# Patient Record
Sex: Female | Born: 1945 | Race: White | Hispanic: No | Marital: Married | Smoking: Former smoker
Health system: Southern US, Community
[De-identification: ages and names within clinical notes are randomized; demographics above are authoritative.]

## PROBLEM LIST (undated history)

## (undated) DIAGNOSIS — E7800 Pure hypercholesterolemia, unspecified: Secondary | ICD-10-CM

## (undated) DIAGNOSIS — Z860100 Personal history of colon polyps, unspecified: Secondary | ICD-10-CM

## (undated) DIAGNOSIS — E039 Hypothyroidism, unspecified: Secondary | ICD-10-CM

## (undated) DIAGNOSIS — K219 Gastro-esophageal reflux disease without esophagitis: Secondary | ICD-10-CM

## (undated) DIAGNOSIS — Z86718 Personal history of other venous thrombosis and embolism: Secondary | ICD-10-CM

## (undated) DIAGNOSIS — I2699 Other pulmonary embolism without acute cor pulmonale: Secondary | ICD-10-CM

## (undated) HISTORY — DX: Personal history of colon polyps, unspecified: Z86.0100

## (undated) HISTORY — DX: Gastro-esophageal reflux disease without esophagitis: K21.9

## (undated) HISTORY — PX: KNEE SURGERY: SHX244

## (undated) HISTORY — DX: Other pulmonary embolism without acute cor pulmonale: I26.99

## (undated) HISTORY — PX: POLYPECTOMY: SHX149

## (undated) HISTORY — PX: TONSILLECTOMY: SUR1361

## (undated) HISTORY — DX: Personal history of other venous thrombosis and embolism: Z86.718

## (undated) HISTORY — DX: Pure hypercholesterolemia, unspecified: E78.00

## (undated) HISTORY — PX: VAGINAL HYSTERECTOMY: SUR661

## (undated) HISTORY — DX: Hypothyroidism, unspecified: E03.9

## (undated) HISTORY — DX: Personal history of colonic polyps: Z86.010

---

## 1988-09-02 DIAGNOSIS — Z86718 Personal history of other venous thrombosis and embolism: Secondary | ICD-10-CM

## 1988-09-02 HISTORY — DX: Personal history of other venous thrombosis and embolism: Z86.718

## 2000-08-14 ENCOUNTER — Other Ambulatory Visit: Admission: RE | Admit: 2000-08-14 | Discharge: 2000-08-14 | Payer: Self-pay | Admitting: Gynecology

## 2001-10-15 ENCOUNTER — Other Ambulatory Visit: Admission: RE | Admit: 2001-10-15 | Discharge: 2001-10-15 | Payer: Self-pay | Admitting: Gynecology

## 2002-12-21 ENCOUNTER — Other Ambulatory Visit: Admission: RE | Admit: 2002-12-21 | Discharge: 2002-12-21 | Payer: Self-pay | Admitting: Gynecology

## 2005-02-26 ENCOUNTER — Other Ambulatory Visit: Admission: RE | Admit: 2005-02-26 | Discharge: 2005-02-26 | Payer: Self-pay | Admitting: Gynecology

## 2006-09-03 ENCOUNTER — Other Ambulatory Visit: Admission: RE | Admit: 2006-09-03 | Discharge: 2006-09-03 | Payer: Self-pay | Admitting: Obstetrics and Gynecology

## 2009-05-19 ENCOUNTER — Emergency Department (HOSPITAL_COMMUNITY): Admission: EM | Admit: 2009-05-19 | Discharge: 2009-05-20 | Payer: Self-pay | Admitting: Emergency Medicine

## 2009-09-13 ENCOUNTER — Encounter (INDEPENDENT_AMBULATORY_CARE_PROVIDER_SITE_OTHER): Payer: Self-pay | Admitting: Emergency Medicine

## 2009-09-13 ENCOUNTER — Ambulatory Visit: Payer: Self-pay | Admitting: Cardiology

## 2009-09-13 ENCOUNTER — Observation Stay (HOSPITAL_COMMUNITY): Admission: EM | Admit: 2009-09-13 | Discharge: 2009-09-13 | Payer: Self-pay | Admitting: Emergency Medicine

## 2010-09-23 ENCOUNTER — Encounter (HOSPITAL_BASED_OUTPATIENT_CLINIC_OR_DEPARTMENT_OTHER): Payer: Self-pay | Admitting: General Surgery

## 2010-11-18 LAB — DIFFERENTIAL
Basophils Absolute: 0 10*3/uL (ref 0.0–0.1)
Basophils Relative: 1 % (ref 0–1)
Eosinophils Absolute: 0.3 10*3/uL (ref 0.0–0.7)
Eosinophils Relative: 4 % (ref 0–5)
Lymphocytes Relative: 47 % — ABNORMAL HIGH (ref 12–46)
Lymphs Abs: 3.6 10*3/uL (ref 0.7–4.0)
Monocytes Absolute: 0.4 10*3/uL (ref 0.1–1.0)
Monocytes Relative: 5 % (ref 3–12)
Neutro Abs: 3.3 10*3/uL (ref 1.7–7.7)
Neutrophils Relative %: 43 % (ref 43–77)

## 2010-11-18 LAB — POCT CARDIAC MARKERS
CKMB, poc: <1 ng/mL — ABNORMAL LOW (ref 1.0–8.0)
Myoglobin, poc: 30.8 ng/mL (ref 12–200)
Troponin i, poc: <0.05 ng/mL (ref 0.00–0.09)

## 2010-11-18 LAB — BASIC METABOLIC PANEL
BUN: 17 mg/dL (ref 6–23)
CO2: 30 mEq/L (ref 19–32)
Calcium: 9.6 mg/dL (ref 8.4–10.5)
Chloride: 105 mEq/L (ref 96–112)
Creatinine, Ser: 1.13 mg/dL (ref 0.4–1.2)
GFR calc Af Amer: 59 mL/min — ABNORMAL LOW (ref >60)
GFR calc non Af Amer: 49 mL/min — ABNORMAL LOW (ref >60)
Glucose, Bld: 107 mg/dL — ABNORMAL HIGH (ref 70–99)
Potassium: 3.7 mEq/L (ref 3.5–5.1)
Sodium: 143 mEq/L (ref 135–145)

## 2010-11-18 LAB — CBC
HCT: 40.2 % (ref 36.0–46.0)
Hemoglobin: 13.6 g/dL (ref 12.0–15.0)
MCHC: 33.7 g/dL (ref 30.0–36.0)
MCV: 93.8 fL (ref 78.0–100.0)
Platelets: 275 10*3/uL (ref 150–400)
RBC: 4.28 MIL/uL (ref 3.87–5.11)
RDW: 13.6 % (ref 11.5–15.5)
WBC: 7.6 10*3/uL (ref 4.0–10.5)

## 2010-11-18 LAB — TROPONIN I
Troponin I: 0.01 ng/mL (ref 0.00–0.06)
Troponin I: 0.02 ng/mL (ref 0.00–0.06)
Troponin I: 0.03 ng/mL (ref 0.00–0.06)

## 2010-11-18 LAB — CK TOTAL AND CKMB (NOT AT ARMC)
CK, MB: 1.1 ng/mL (ref 0.3–4.0)
CK, MB: 1.1 ng/mL (ref 0.3–4.0)
CK, MB: 1.1 ng/mL (ref 0.3–4.0)
Relative Index: INVALID (ref 0.0–2.5)
Relative Index: INVALID (ref 0.0–2.5)
Relative Index: INVALID (ref 0.0–2.5)
Total CK: 65 U/L (ref 7–177)
Total CK: 70 U/L (ref 7–177)
Total CK: 74 U/L (ref 7–177)

## 2010-11-18 LAB — D-DIMER, QUANTITATIVE (NOT AT ARMC): D-Dimer, Quant: 0.29 ug/mL-FEU (ref 0.00–0.48)

## 2010-11-18 LAB — TSH: TSH: 2.795 u[IU]/mL (ref 0.350–4.500)

## 2010-12-07 LAB — BASIC METABOLIC PANEL
BUN: 19 mg/dL (ref 6–23)
CO2: 30 mEq/L (ref 19–32)
Calcium: 9.6 mg/dL (ref 8.4–10.5)
Chloride: 105 mEq/L (ref 96–112)
Creatinine, Ser: 0.84 mg/dL (ref 0.4–1.2)
GFR calc Af Amer: >60 mL/min (ref >60)
GFR calc non Af Amer: >60 mL/min (ref >60)
Glucose, Bld: 94 mg/dL (ref 70–99)
Potassium: 3.3 mEq/L — ABNORMAL LOW (ref 3.5–5.1)
Sodium: 141 mEq/L (ref 135–145)

## 2010-12-07 LAB — URINALYSIS, ROUTINE W REFLEX MICROSCOPIC
Bilirubin Urine: NEGATIVE
Glucose, UA: NEGATIVE mg/dL
Ketones, ur: NEGATIVE mg/dL
Nitrite: NEGATIVE
Protein, ur: NEGATIVE mg/dL
Specific Gravity, Urine: 1.02 (ref 1.005–1.030)
Urobilinogen, UA: 0.2 mg/dL (ref 0.0–1.0)
pH: 5.5 (ref 5.0–8.0)

## 2010-12-07 LAB — CBC
HCT: 40.9 % (ref 36.0–46.0)
Hemoglobin: 13.6 g/dL (ref 12.0–15.0)
MCHC: 33.3 g/dL (ref 30.0–36.0)
MCV: 92.8 fL (ref 78.0–100.0)
Platelets: 288 10*3/uL (ref 150–400)
RBC: 4.41 MIL/uL (ref 3.87–5.11)
RDW: 13.1 % (ref 11.5–15.5)
WBC: 8.6 10*3/uL (ref 4.0–10.5)

## 2010-12-07 LAB — URINE MICROSCOPIC-ADD ON

## 2010-12-07 LAB — URINE CULTURE: Colony Count: 15000

## 2010-12-07 LAB — DIFFERENTIAL
Basophils Absolute: 0.1 10*3/uL (ref 0.0–0.1)
Basophils Relative: 1 % (ref 0–1)
Eosinophils Absolute: 0.4 10*3/uL (ref 0.0–0.7)
Eosinophils Relative: 4 % (ref 0–5)
Lymphocytes Relative: 44 % (ref 12–46)
Lymphs Abs: 3.7 10*3/uL (ref 0.7–4.0)
Monocytes Absolute: 0.5 10*3/uL (ref 0.1–1.0)
Monocytes Relative: 6 % (ref 3–12)
Neutro Abs: 3.9 10*3/uL (ref 1.7–7.7)
Neutrophils Relative %: 45 % (ref 43–77)

## 2011-05-11 ENCOUNTER — Emergency Department (HOSPITAL_COMMUNITY): Payer: BC Managed Care – PPO

## 2011-05-11 ENCOUNTER — Emergency Department (HOSPITAL_COMMUNITY)
Admission: EM | Admit: 2011-05-11 | Discharge: 2011-05-11 | Disposition: A | Discharge status: Home / Self Care | Payer: BC Managed Care – PPO | Attending: Emergency Medicine | Admitting: Emergency Medicine

## 2011-05-11 DIAGNOSIS — E039 Hypothyroidism, unspecified: Secondary | ICD-10-CM | POA: Insufficient documentation

## 2011-05-11 DIAGNOSIS — R109 Unspecified abdominal pain: Secondary | ICD-10-CM | POA: Insufficient documentation

## 2011-05-11 DIAGNOSIS — Z90710 Acquired absence of both cervix and uterus: Secondary | ICD-10-CM | POA: Insufficient documentation

## 2011-05-11 DIAGNOSIS — R110 Nausea: Secondary | ICD-10-CM | POA: Insufficient documentation

## 2011-05-11 DIAGNOSIS — M545 Low back pain, unspecified: Secondary | ICD-10-CM | POA: Insufficient documentation

## 2011-05-11 DIAGNOSIS — R350 Frequency of micturition: Secondary | ICD-10-CM | POA: Insufficient documentation

## 2011-05-11 DIAGNOSIS — E7800 Pure hypercholesterolemia, unspecified: Secondary | ICD-10-CM | POA: Insufficient documentation

## 2011-05-11 LAB — DIFFERENTIAL
Basophils Absolute: 0 10*3/uL (ref 0.0–0.1)
Basophils Relative: 1 % (ref 0–1)
Eosinophils Absolute: 0.3 10*3/uL (ref 0.0–0.7)
Eosinophils Relative: 4 % (ref 0–5)
Lymphocytes Relative: 38 % (ref 12–46)
Lymphs Abs: 2.7 10*3/uL (ref 0.7–4.0)
Monocytes Absolute: 0.5 10*3/uL (ref 0.1–1.0)
Monocytes Relative: 7 % (ref 3–12)
Neutro Abs: 3.6 10*3/uL (ref 1.7–7.7)
Neutrophils Relative %: 51 % (ref 43–77)

## 2011-05-11 LAB — CBC
HCT: 39.1 % (ref 36.0–46.0)
Hemoglobin: 14 g/dL (ref 12.0–15.0)
MCH: 32.1 pg (ref 26.0–34.0)
MCHC: 35.8 g/dL (ref 30.0–36.0)
MCV: 89.7 fL (ref 78.0–100.0)
Platelets: 259 10*3/uL (ref 150–400)
RBC: 4.36 MIL/uL (ref 3.87–5.11)
RDW: 13.2 % (ref 11.5–15.5)
WBC: 7.1 10*3/uL (ref 4.0–10.5)

## 2011-05-11 LAB — URINALYSIS, ROUTINE W REFLEX MICROSCOPIC
Bilirubin Urine: NEGATIVE
Glucose, UA: NEGATIVE mg/dL
Ketones, ur: NEGATIVE mg/dL
Leukocytes, UA: NEGATIVE
Nitrite: NEGATIVE
Protein, ur: NEGATIVE mg/dL
Specific Gravity, Urine: 1.009 (ref 1.005–1.030)
Urobilinogen, UA: 0.2 mg/dL (ref 0.0–1.0)
pH: 6 (ref 5.0–8.0)

## 2011-05-11 LAB — URINE MICROSCOPIC-ADD ON

## 2011-05-11 LAB — BASIC METABOLIC PANEL
BUN: 19 mg/dL (ref 6–23)
CO2: 29 mEq/L (ref 19–32)
Calcium: 9.9 mg/dL (ref 8.4–10.5)
Chloride: 100 mEq/L (ref 96–112)
Creatinine, Ser: 0.73 mg/dL (ref 0.50–1.10)
GFR calc Af Amer: >60 mL/min (ref >60)
GFR calc non Af Amer: >60 mL/min (ref >60)
Glucose, Bld: 108 mg/dL — ABNORMAL HIGH (ref 70–99)
Potassium: 4.1 mEq/L (ref 3.5–5.1)
Sodium: 136 mEq/L (ref 135–145)

## 2011-05-12 LAB — URINE CULTURE
Colony Count: 2000
Culture  Setup Time: 201209081343

## 2012-08-21 ENCOUNTER — Ambulatory Visit (INDEPENDENT_AMBULATORY_CARE_PROVIDER_SITE_OTHER): Payer: BC Managed Care – PPO | Admitting: Women's Health

## 2012-08-21 ENCOUNTER — Encounter: Payer: Self-pay | Admitting: Women's Health

## 2012-08-21 DIAGNOSIS — N898 Other specified noninflammatory disorders of vagina: Secondary | ICD-10-CM

## 2012-08-21 DIAGNOSIS — L293 Anogenital pruritus, unspecified: Secondary | ICD-10-CM

## 2012-08-21 LAB — WET PREP FOR TRICH, YEAST, CLUE
Clue Cells Wet Prep HPF POC: NONE SEEN
Trich, Wet Prep: NONE SEEN
Yeast Wet Prep HPF POC: NONE SEEN

## 2012-08-21 MED ORDER — TERCONAZOLE 0.4 % VA CREA: 1.0000 | TOPICAL_CREAM | Freq: Every day | VAGINAL | Status: DC

## 2012-08-21 NOTE — Progress Notes (Signed)
Patient ID: Sandra Ellis, female   DOB: 03-Mar-1946, 66 y.o.   MRN: [id number] Presents with complaint of external vaginal itching. Denies any urinary symptoms, fever or abdominal pain. History of a hysterectomy.Marland Kitchen Has not been seen in the office in several years. Plans to schedule next annual exam here is current on Paps and mammograms. Reports a history of a normal bone density last year and colonoscopy with no polyps. Tdap 2012.  Exam: External genitalia extremely erythematous at introitus, speculum exam vaginal atrophy with dryness, wet prep negative. Bimanual no adnexal fullness or tenderness.  Clinical yeast,  Plan: Terazol 7, apply externally, instructed to call if no relief of symptoms. Loose clothes,

## 2012-12-28 ENCOUNTER — Encounter: Payer: Self-pay | Admitting: Women's Health

## 2012-12-28 ENCOUNTER — Ambulatory Visit (INDEPENDENT_AMBULATORY_CARE_PROVIDER_SITE_OTHER): Payer: BC Managed Care – PPO | Admitting: Women's Health

## 2012-12-28 VITALS — BP 110/72 | Ht 67.0 in | Wt 137.0 lb

## 2012-12-28 DIAGNOSIS — Z01419 Encounter for gynecological examination (general) (routine) without abnormal findings: Secondary | ICD-10-CM

## 2012-12-28 NOTE — Progress Notes (Signed)
Sandra Ellis 13-Sep-1945 [id number]    History:    The patient presents for annual exam.  TVH for DUB on no HRT.  Normal paps and mammograms.  Negative colonoscopy 2012. Reports labs and Dexa normal at [facility]. Reports would not take medication if osteoporosis and declines repeating dexa.  Has had elevated chol and is taking Niacin, fish oil and red rice yeast, declined statins from [facility].   Past medical history, past surgical history, family history and social history were all reviewed and documented in the EPIC chart. Minister, does mission trips. Current on all immunizations.   ROS:  A  ROS was performed and pertinent positives and negatives are included in the history.  Exam:  Filed Vitals:   12/28/12 1044  BP: 110/72    General appearance:  Normal Head/Neck:  Normal, without cervical or supraclavicular adenopathy. Thyroid:  Symmetrical, normal in size, without palpable masses or nodularity. Respiratory  Effort:  Normal  Auscultation:  Clear without wheezing or rhonchi Cardiovascular  Auscultation:  Regular rate, without rubs, murmurs or gallops  Edema/varicosities:  Not grossly evident Abdominal  Soft,nontender, without masses, guarding or rebound.  Liver/spleen:  No organomegaly noted  Hernia:  None appreciated  Skin  Inspection:  Grossly normal  Palpation:  Grossly normal Neurologic/psychiatric  Orientation:  Normal with appropriate conversation.  Mood/affect:  Normal  Genitourinary    Breasts: Examined lying and sitting.     Right: Without masses, retractions, discharge or axillary adenopathy.     Left: Without masses, retractions, discharge or axillary adenopathy.   Inguinal/mons:  Normal without inguinal adenopathy  External genitalia:  Normal  BUS/Urethra/Skene's glands:  Normal  Bladder:  Normal  Vagina:  Normal  Cervix:  absent  Uterus:  Absent  Adnexa/parametria:     Rt: Without masses or tenderness.   Lt: Without masses or tenderness.  Anus and  perineum: Normal  Digital rectal exam: Normal sphincter tone without palpated masses or tenderness  Assessment/Plan:  67 y.o.MWF   for annual exam with no complaints.  Hysterectomy for DUB/ on no HRT Labs- PC  Plan:  SBE's, continue annual mammogram, regular exercise, calcium rich diet, Vit D 2000 daily encouraged.Home safety and fall prevention reviewed.  Home hemocult card given.   Harrington Challenger WHNP, 7:00 PM 12/28/2012

## 2012-12-28 NOTE — Patient Instructions (Signed)

## 2013-01-01 ENCOUNTER — Encounter: Payer: Self-pay | Admitting: Women's Health

## 2013-02-23 DIAGNOSIS — R109 Unspecified abdominal pain: Secondary | ICD-10-CM | POA: Diagnosis not present

## 2013-02-25 ENCOUNTER — Encounter (HOSPITAL_COMMUNITY): Payer: Self-pay | Admitting: Emergency Medicine

## 2013-02-25 ENCOUNTER — Emergency Department (HOSPITAL_COMMUNITY): Payer: Medicare Other

## 2013-02-25 ENCOUNTER — Emergency Department (HOSPITAL_COMMUNITY)
Admission: EM | Admit: 2013-02-25 | Discharge: 2013-02-25 | Disposition: A | Discharge status: Home / Self Care | Payer: Medicare Other | Attending: Emergency Medicine | Admitting: Emergency Medicine

## 2013-02-25 DIAGNOSIS — K828 Other specified diseases of gallbladder: Secondary | ICD-10-CM | POA: Diagnosis not present

## 2013-02-25 DIAGNOSIS — R1033 Periumbilical pain: Secondary | ICD-10-CM | POA: Diagnosis not present

## 2013-02-25 DIAGNOSIS — Z87891 Personal history of nicotine dependence: Secondary | ICD-10-CM | POA: Insufficient documentation

## 2013-02-25 DIAGNOSIS — Z79899 Other long term (current) drug therapy: Secondary | ICD-10-CM | POA: Diagnosis not present

## 2013-02-25 DIAGNOSIS — R109 Unspecified abdominal pain: Secondary | ICD-10-CM | POA: Insufficient documentation

## 2013-02-25 DIAGNOSIS — Z7982 Long term (current) use of aspirin: Secondary | ICD-10-CM | POA: Insufficient documentation

## 2013-02-25 DIAGNOSIS — E7800 Pure hypercholesterolemia, unspecified: Secondary | ICD-10-CM | POA: Insufficient documentation

## 2013-02-25 DIAGNOSIS — Z90710 Acquired absence of both cervix and uterus: Secondary | ICD-10-CM | POA: Diagnosis not present

## 2013-02-25 LAB — COMPREHENSIVE METABOLIC PANEL
ALT: 16 U/L (ref 0–35)
AST: 18 U/L (ref 0–37)
Albumin: 3.5 g/dL (ref 3.5–5.2)
Alkaline Phosphatase: 64 U/L (ref 39–117)
BUN: 13 mg/dL (ref 6–23)
CO2: 28 mEq/L (ref 19–32)
Calcium: 9.8 mg/dL (ref 8.4–10.5)
Chloride: 97 mEq/L (ref 96–112)
Creatinine, Ser: 0.76 mg/dL (ref 0.50–1.10)
GFR calc Af Amer: >90 mL/min (ref >90)
GFR calc non Af Amer: 87 mL/min — ABNORMAL LOW (ref >90)
Glucose, Bld: 114 mg/dL — ABNORMAL HIGH (ref 70–99)
Potassium: 2.9 mEq/L — ABNORMAL LOW (ref 3.5–5.1)
Sodium: 136 mEq/L (ref 135–145)
Total Bilirubin: 0.3 mg/dL (ref 0.3–1.2)
Total Protein: 7.4 g/dL (ref 6.0–8.3)

## 2013-02-25 LAB — CBC WITH DIFFERENTIAL/PLATELET
Basophils Absolute: 0 10*3/uL (ref 0.0–0.1)
Basophils Relative: 0 % (ref 0–1)
Eosinophils Absolute: 0.3 10*3/uL (ref 0.0–0.7)
Eosinophils Relative: 3 % (ref 0–5)
HCT: 42.6 % (ref 36.0–46.0)
Hemoglobin: 14.8 g/dL (ref 12.0–15.0)
Lymphocytes Relative: 34 % (ref 12–46)
Lymphs Abs: 2.7 10*3/uL (ref 0.7–4.0)
MCH: 30.7 pg (ref 26.0–34.0)
MCHC: 34.7 g/dL (ref 30.0–36.0)
MCV: 88.4 fL (ref 78.0–100.0)
Monocytes Absolute: 0.5 10*3/uL (ref 0.1–1.0)
Monocytes Relative: 7 % (ref 3–12)
Neutro Abs: 4.4 10*3/uL (ref 1.7–7.7)
Neutrophils Relative %: 56 % (ref 43–77)
Platelets: 288 10*3/uL (ref 150–400)
RBC: 4.82 MIL/uL (ref 3.87–5.11)
RDW: 13 % (ref 11.5–15.5)
WBC: 7.8 10*3/uL (ref 4.0–10.5)

## 2013-02-25 LAB — URINALYSIS, ROUTINE W REFLEX MICROSCOPIC
Bilirubin Urine: NEGATIVE
Glucose, UA: NEGATIVE mg/dL
Hgb urine dipstick: NEGATIVE
Ketones, ur: NEGATIVE mg/dL
Nitrite: NEGATIVE
Protein, ur: NEGATIVE mg/dL
Specific Gravity, Urine: 1.017 (ref 1.005–1.030)
Urobilinogen, UA: 1 mg/dL (ref 0.0–1.0)
pH: 7.5 (ref 5.0–8.0)

## 2013-02-25 LAB — LIPASE, BLOOD: Lipase: 29 U/L (ref 11–59)

## 2013-02-25 LAB — URINE MICROSCOPIC-ADD ON

## 2013-02-25 MED ORDER — ONDANSETRON HCL 4 MG/2ML IJ SOLN
4.0000 mg | Freq: Once | INTRAMUSCULAR | Status: AC
  Administered 2013-02-25: 4 mg via INTRAVENOUS

## 2013-02-25 MED ORDER — BELLADONNA ALK-PHENOBARBITAL 16.2 MG PO TABS: 1.0000 | ORAL_TABLET | Freq: Three times a day (TID) | ORAL | Status: DC | PRN

## 2013-02-25 MED ORDER — PROMETHAZINE HCL 25 MG PO TABS: 25.0000 mg | ORAL_TABLET | Freq: Four times a day (QID) | ORAL | Status: DC | PRN

## 2013-02-25 MED ORDER — POTASSIUM CHLORIDE CRYS ER 20 MEQ PO TBCR
40.0000 meq | EXTENDED_RELEASE_TABLET | Freq: Once | ORAL | Status: AC
  Administered 2013-02-25: 40 meq via ORAL
  Filled 2013-02-25: qty 2

## 2013-02-25 MED ORDER — HYDROMORPHONE HCL PF 1 MG/ML IJ SOLN: 0.5000 mg | Freq: Once | INTRAMUSCULAR | Status: DC

## 2013-02-25 MED ORDER — OXYCODONE-ACETAMINOPHEN 5-325 MG PO TABS: 1.0000 | ORAL_TABLET | Freq: Four times a day (QID) | ORAL | Status: DC | PRN

## 2013-02-25 MED ORDER — ONDANSETRON HCL 4 MG/2ML IJ SOLN: 4.0000 mg | Freq: Once | INTRAMUSCULAR | Status: DC

## 2013-02-25 MED ORDER — IOHEXOL 300 MG/ML  SOLN
100.0000 mL | Freq: Once | INTRAMUSCULAR | Status: AC | PRN
  Administered 2013-02-25: 100 mL via INTRAVENOUS

## 2013-02-25 MED ORDER — IOHEXOL 300 MG/ML  SOLN
50.0000 mL | Freq: Once | INTRAMUSCULAR | Status: AC | PRN
  Administered 2013-02-25: 50 mL via ORAL

## 2013-02-25 MED ORDER — HYDROMORPHONE HCL PF 1 MG/ML IJ SOLN
0.5000 mg | Freq: Once | INTRAMUSCULAR | Status: AC
  Administered 2013-02-25: 0.5 mg via INTRAVENOUS
  Filled 2013-02-25: qty 1

## 2013-02-25 MED ORDER — ONDANSETRON HCL 4 MG/2ML IJ SOLN
4.0000 mg | Freq: Once | INTRAMUSCULAR | Status: DC
  Filled 2013-02-25: qty 2

## 2013-02-25 MED ORDER — SODIUM CHLORIDE 0.9 % IV SOLN
Freq: Once | INTRAVENOUS | Status: AC
  Administered 2013-02-25: 09:00:00 via INTRAVENOUS

## 2013-02-25 NOTE — ED Provider Notes (Signed)
History    CSN: [id number] Arrival date & time 02/25/13  8295  First MD Initiated Contact with Patient 02/25/13 0809     Chief Complaint  Patient presents with  . Abdominal Pain   (Consider location/radiation/quality/duration/timing/severity/associated sxs/prior Treatment) HPI.... lower abdominal pain radiating to the. Umbilical area since Sunday. Patient went to her primary care doctor on Tuesday and diagnosed with no identifiable cause.  Nothing makes symptoms better or worse. Severity is mild. Patient is eating. No dysuria, constipation, fever, chills, vaginal bleeding, vaginal discharge..  Status post vaginal hysterectomy years ago.  No radiation of pain Past Medical History  Diagnosis Date  . Headache(784.0)   . Elevated cholesterol    Past Surgical History  Procedure Laterality Date  . Vaginal hysterectomy      TVH  . Tonisllectomy    . Knee surgery     Family History  Problem Relation Age of Onset  . Diabetes Mother    History  Substance Use Topics  . Smoking status: Former Games developer  . Smokeless tobacco: Not on file  . Alcohol Use: No   OB History   Grav Para Term Preterm Abortions TAB SAB Ect Mult Living   3 3        3      Review of Systems  All other systems reviewed and are negative.    Allergies  Doxycycline  Home Medications   Current Outpatient Rx  Name  Route  Sig  Dispense  Refill  . aspirin 81 MG tablet   Oral   Take 81 mg by mouth 2 (two) times daily.         Marland Kitchen atenolol (TENORMIN) 25 MG tablet   Oral   Take 12.5 mg by mouth daily.          Marland Kitchen co-enzyme Q-10 30 MG capsule   Oral   Take 30 mg by mouth 3 (three) times daily.         . Cranberry 1000 MG CAPS   Oral   Take by mouth.         Marland Kitchen glucosamine-chondroitin 500-400 MG tablet   Oral   Take 1 tablet by mouth 3 (three) times daily.         . hydrochlorothiazide (HYDRODIURIL) 25 MG tablet   Oral   Take 25 mg by mouth daily.         Marland Kitchen levothyroxine (SYNTHROID,  LEVOTHROID) 75 MCG tablet   Oral   Take 75 mcg by mouth daily.         Marland Kitchen omega-3 acid ethyl esters (LOVAZA) 1 G capsule   Oral   Take 2 g by mouth 2 (two) times daily.         . Probiotic Product (ALIGN PO)   Oral   Take 1 capsule by mouth daily.         . belladonna alk-PHENObarbital (DONNATAL) 16.2 MG tablet   Oral   Take 1 tablet by mouth 3 (three) times daily as needed.   30 tablet   0   . oxyCODONE-acetaminophen (PERCOCET/ROXICET) 5-325 MG per tablet   Oral   Take 1-2 tablets by mouth every 6 (six) hours as needed for pain.   20 tablet   0   . promethazine (PHENERGAN) 25 MG tablet   Oral   Take 1 tablet (25 mg total) by mouth every 6 (six) hours as needed for nausea.   20 tablet   0    BP 119/57  Pulse 55  Temp(Src) 97.8 F (36.6 C) (Oral)  Resp 20  SpO2 95% Physical Exam  Nursing note and vitals reviewed. Constitutional: She is oriented to person, place, and time. She appears well-developed and well-nourished.  HENT:  Head: Normocephalic and atraumatic.  Eyes: Conjunctivae and EOM are normal. Pupils are equal, round, and reactive to light.  Neck: Normal range of motion. Neck supple.  Cardiovascular: Normal rate, regular rhythm and normal heart sounds.   Pulmonary/Chest: Effort normal and breath sounds normal.  Abdominal: Soft. Bowel sounds are normal.  Minimal suprapubic discomfort and periumbilical discomfort to palpation  Musculoskeletal: Normal range of motion.  Neurological: She is alert and oriented to person, place, and time.  Skin: Skin is warm and dry.  Psychiatric: She has a normal mood and affect.    ED Course  Procedures (including critical care time) Labs Reviewed  COMPREHENSIVE METABOLIC PANEL - Abnormal; Notable for the following:    Potassium 2.9 (*)    Glucose, Bld 114 (*)    GFR calc non Af Amer 87 (*)    All other components within normal limits  URINALYSIS, ROUTINE W REFLEX MICROSCOPIC - Abnormal; Notable for the following:     Leukocytes, UA SMALL (*)    All other components within normal limits  CBC WITH DIFFERENTIAL  LIPASE, BLOOD  URINE MICROSCOPIC-ADD ON   Ct Abdomen Pelvis W Contrast  02/25/2013   *RADIOLOGY REPORT*  Clinical Data: Lower abdomen pain for several days, mild nausea, normal white blood cell count  CT ABDOMEN AND PELVIS WITH CONTRAST  Technique:  Multidetector CT imaging of the abdomen and pelvis was performed following the standard protocol during bolus administration of intravenous contrast.  Contrast: 50mL OMNIPAQUE IOHEXOL 300 MG/ML  SOLN, OMNIPAQUE IOHEXOL 300 MG/ML  SOLN  Comparison: CT abdomen pelvis of 05/11/2011  Findings: The lung bases are clear.  The probable cyst in the dome of the right lobe of liver is stable.  The liver enhances with no focal abnormality and no ductal dilatation is seen.  The gallbladder is slightly distended but no gallstones are noted and there is no definite gallbladder wall thickening.  There is diffuse atrophy of the pancreas.  The adrenal glands and spleen are unremarkable.  The stomach is moderately fluid distended with no significant abnormality noted.  The kidneys enhance with no calculus or mass and on delayed images, the pelvocaliceal systems are unremarkable.  The proximal ureters are normal in caliber.  The abdominal aorta is normal in caliber with only mild atheromatous change present.  No adenopathy is seen.  The urinary bladder is not well distended.  The patient has previous undergone hysterectomy, and no adnexal lesion is seen.  No fluid is noted within the pelvis.  The appendix is coiled within the posterior right lower pelvis and is only 6 mm in diameter.  No inflammatory process is seen.  The terminal ileum also is unremarkable.  Degenerative joint disease is noted primarily involving the left hip.  The lumbar vertebrae are in normal alignment with normal joint spaces.  IMPRESSION:  1.  No explanation for the patient's lower abdominal pain is seen. 2.   The appendix and terminal ileum are unremarkable. 3.  Slightly distended gallbladder but no gallstones or gallbladder wall thickening. 4.  Somewhat atrophic appearance of the pancreas. 5.  Degenerative joint disease of the left hip.   Original Report Authenticated By: Dwyane Dee, M.D.   1. Abdominal pain     MDM  No acute abdomen. CT scan  shows no acute pathology. Patient has primary care followup. Discharge meds Donnatal, Percocet, Phenergan 25 mg  Donnetta Hutching, MD 02/25/13 1440

## 2013-02-25 NOTE — ED Notes (Signed)
States that she has had abd pain since Sunday was seen by her PCP on Tuesday and was given medications and that has not helped.

## 2013-02-26 ENCOUNTER — Telehealth (HOSPITAL_COMMUNITY): Payer: Self-pay | Admitting: Emergency Medicine

## 2013-05-10 ENCOUNTER — Ambulatory Visit
Admission: RE | Admit: 2013-05-10 | Discharge: 2013-05-10 | Disposition: A | Discharge status: Home / Self Care | Payer: Medicare Other | Source: Ambulatory Visit | Attending: Family Medicine | Admitting: Family Medicine

## 2013-05-10 ENCOUNTER — Other Ambulatory Visit: Payer: Self-pay | Admitting: Family Medicine

## 2013-05-10 DIAGNOSIS — E042 Nontoxic multinodular goiter: Secondary | ICD-10-CM

## 2013-05-10 DIAGNOSIS — B9789 Other viral agents as the cause of diseases classified elsewhere: Secondary | ICD-10-CM | POA: Diagnosis not present

## 2013-07-02 DIAGNOSIS — L2089 Other atopic dermatitis: Secondary | ICD-10-CM | POA: Diagnosis not present

## 2013-07-02 DIAGNOSIS — E039 Hypothyroidism, unspecified: Secondary | ICD-10-CM | POA: Diagnosis not present

## 2013-07-02 DIAGNOSIS — I10 Essential (primary) hypertension: Secondary | ICD-10-CM | POA: Diagnosis not present

## 2013-07-02 DIAGNOSIS — Z1231 Encounter for screening mammogram for malignant neoplasm of breast: Secondary | ICD-10-CM | POA: Diagnosis not present

## 2013-07-02 DIAGNOSIS — E785 Hyperlipidemia, unspecified: Secondary | ICD-10-CM | POA: Diagnosis not present

## 2013-07-05 ENCOUNTER — Encounter: Payer: Self-pay | Admitting: Women's Health

## 2013-09-08 DIAGNOSIS — J209 Acute bronchitis, unspecified: Secondary | ICD-10-CM | POA: Diagnosis not present

## 2013-09-10 DIAGNOSIS — J209 Acute bronchitis, unspecified: Secondary | ICD-10-CM | POA: Diagnosis not present

## 2013-09-10 DIAGNOSIS — Z888 Allergy status to other drugs, medicaments and biological substances status: Secondary | ICD-10-CM | POA: Diagnosis not present

## 2013-12-13 DIAGNOSIS — L03039 Cellulitis of unspecified toe: Secondary | ICD-10-CM | POA: Diagnosis not present

## 2013-12-29 ENCOUNTER — Encounter: Payer: Self-pay | Admitting: Women's Health

## 2013-12-29 ENCOUNTER — Ambulatory Visit (INDEPENDENT_AMBULATORY_CARE_PROVIDER_SITE_OTHER): Payer: Medicare Other | Admitting: Women's Health

## 2013-12-29 VITALS — BP 108/64 | Ht 67.25 in | Wt 140.0 lb

## 2013-12-29 DIAGNOSIS — E039 Hypothyroidism, unspecified: Secondary | ICD-10-CM | POA: Diagnosis not present

## 2013-12-29 DIAGNOSIS — I10 Essential (primary) hypertension: Secondary | ICD-10-CM | POA: Diagnosis not present

## 2013-12-29 DIAGNOSIS — N952 Postmenopausal atrophic vaginitis: Secondary | ICD-10-CM | POA: Diagnosis not present

## 2013-12-29 NOTE — Patient Instructions (Signed)
Health Recommendations for Postmenopausal Women Respected and ongoing research has looked at the most common causes of death, disability, and poor quality of life in postmenopausal women. The causes include heart disease, diseases of blood vessels, diabetes, depression, cancer, and bone loss (osteoporosis). Many things can be done to help lower the chances of developing these and other common problems: CARDIOVASCULAR DISEASE Heart Disease: A heart attack is a medical emergency. Know the signs and symptoms of a heart attack. Below are things women can do to reduce their risk for heart disease.   Do not smoke. If you smoke, quit.  Aim for a healthy weight. Being overweight causes many preventable deaths. Eat a healthy and balanced diet and drink an adequate amount of liquids.  Get moving. Make a commitment to be more physically active. Aim for 30 minutes of activity on most, if not all days of the week.  Eat for heart health. Choose a diet that is low in saturated fat and cholesterol and eliminate trans fat. Include whole grains, vegetables, and fruits. Read and understand the labels on food containers before buying.  Know your numbers. Ask your caregiver to check your blood pressure, cholesterol (total, HDL, LDL, triglycerides) and blood glucose. Work with your caregiver on improving your entire clinical picture.  High blood pressure. Limit or stop your table salt intake (try salt substitute and food seasonings). Avoid salty foods and drinks. Read labels on food containers before buying. Eating well and exercising can help control high blood pressure. STROKE  Stroke is a medical emergency. Stroke may be the result of a blood clot in a blood vessel in the brain or by a brain hemorrhage (bleeding). Know the signs and symptoms of a stroke. To lower the risk of developing a stroke:  Avoid fatty foods.  Quit smoking.  Control your diabetes, blood pressure, and irregular heart rate. THROMBOPHLEBITIS  (BLOOD CLOT) OF THE LEG  Becoming overweight and leading a stationary lifestyle may also contribute to developing blood clots. Controlling your diet and exercising will help lower the risk of developing blood clots. CANCER SCREENING  Breast Cancer: Take steps to reduce your risk of breast cancer.  You should practice "breast self-awareness." This means understanding the normal appearance and feel of your breasts and should include breast self-examination. Any changes detected, no matter how small, should be reported to your caregiver.  After age 40, you should have a clinical breast exam (CBE) every year.  Starting at age 40, you should consider having a mammogram (breast X-ray) every year.  If you have a family history of breast cancer, talk to your caregiver about genetic screening.  If you are at high risk for breast cancer, talk to your caregiver about having an MRI and a mammogram every year.  Intestinal or Stomach Cancer: Tests to consider are a rectal exam, fecal occult blood, sigmoidoscopy, and colonoscopy. Women who are high risk may need to be screened at an earlier age and more often.  Cervical Cancer:  Beginning at age 30, you should have a Pap test every 3 years as long as the past 3 Pap tests have been normal.  If you have had past treatment for cervical cancer or a condition that could lead to cancer, you need Pap tests and screening for cancer for at least 20 years after your treatment.  If you had a hysterectomy for a problem that was not cancer or a condition that could lead to cancer, then you no longer need Pap tests.    If you are between ages 65 and 70, and you have had normal Pap tests going back 10 years, you no longer need Pap tests.  If Pap tests have been discontinued, risk factors (such as a new sexual partner) need to be reassessed to determine if screening should be resumed.  Some medical problems can increase the chance of getting cervical cancer. In these  cases, your caregiver may recommend more frequent screening and Pap tests.  Uterine Cancer: If you have vaginal bleeding after reaching menopause, you should notify your caregiver.  Ovarian cancer: Other than yearly pelvic exams, there are no reliable tests available to screen for ovarian cancer at this time except for yearly pelvic exams.  Lung Cancer: Yearly chest X-rays can detect lung cancer and should be done on high risk women, such as cigarette smokers and women with chronic lung disease (emphysema).  Skin Cancer: A complete body skin exam should be done at your yearly examination. Avoid overexposure to the sun and ultraviolet light lamps. Use a strong sun block cream when in the sun. All of these things are important in lowering the risk of skin cancer. MENOPAUSE Menopause Symptoms: Hormone therapy products are effective for treating symptoms associated with menopause:  Moderate to severe hot flashes.  Night sweats.  Mood swings.  Headaches.  Tiredness.  Loss of sex drive.  Insomnia.  Other symptoms. Hormone replacement carries certain risks, especially in older women. Women who use or are thinking about using estrogen or estrogen with progestin treatments should discuss that with their caregiver. Your caregiver will help you understand the benefits and risks. The ideal dose of hormone replacement therapy is not known. The Food and Drug Administration (FDA) has concluded that hormone therapy should be used only at the lowest doses and for the shortest amount of time to reach treatment goals.  OSTEOPOROSIS Protecting Against Bone Loss and Preventing Fracture: If you use hormone therapy for prevention of bone loss (osteoporosis), the risks for bone loss must outweigh the risk of the therapy. Ask your caregiver about other medications known to be safe and effective for preventing bone loss and fractures. To guard against bone loss or fractures, the following is recommended:  If  you are less than age 50, take 1000 mg of calcium and at least 600 mg of Vitamin D per day.  If you are greater than age 50 but less than age 70, take 1200 mg of calcium and at least 600 mg of Vitamin D per day.  If you are greater than age 70, take 1200 mg of calcium and at least 800 mg of Vitamin D per day. Smoking and excessive alcohol intake increases the risk of osteoporosis. Eat foods rich in calcium and vitamin D and do weight bearing exercises several times a week as your caregiver suggests. DIABETES Diabetes Melitus: If you have Type I or Type 2 diabetes, you should keep your blood sugar under control with diet, exercise and recommended medication. Avoid too many sweets, starchy and fatty foods. Being overweight can make control more difficult. COGNITION AND MEMORY Cognition and Memory: Menopausal hormone therapy is not recommended for the prevention of cognitive disorders such as Alzheimer's disease or memory loss.  DEPRESSION  Depression may occur at any age, but is common in elderly women. The reasons may be because of physical, medical, social (loneliness), or financial problems and needs. If you are experiencing depression because of medical problems and control of symptoms, talk to your caregiver about this. Physical activity and   exercise may help with mood and sleep. Community and volunteer involvement may help your sense of value and worth. If you have depression and you feel that the problem is getting worse or becoming severe, talk to your caregiver about treatment options that are best for you. ACCIDENTS  Accidents are common and can be serious in the elderly woman. Prepare your house to prevent accidents. Eliminate throw rugs, place hand bars in the bath, shower and toilet areas. Avoid wearing high heeled shoes or walking on wet, snowy, and icy areas. Limit or stop driving if you have vision or hearing problems, or you feel you are unsteady with you movements and  reflexes. HEPATITIS C Hepatitis C is a type of viral infection affecting the liver. It is spread mainly through contact with blood from an infected person. It can be treated, but if left untreated, it can lead to severe liver damage over years. Many people who are infected do not know that the virus is in their blood. If you are a "baby-boomer", it is recommended that you have one screening test for Hepatitis C. IMMUNIZATIONS  Several immunizations are important to consider having during your senior years, including:   Tetanus, diptheria, and pertussis booster shot.  Influenza every year before the flu season begins.  Pneumonia vaccine.  Shingles vaccine.  Others as indicated based on your specific needs. Talk to your caregiver about these. Document Released: 10/11/2005 Document Revised: 08/05/2012 Document Reviewed: 06/06/2008 Exit[employer] Patient Information 2014 [employer].  

## 2013-12-29 NOTE — Progress Notes (Signed)
Sandra Ellis November 23, 1945 [id number]    History:    Presents for breast and pelvic exam. TAH for dysfunctional uterine bleeding on no HRT. Normal Pap and mammogram history. Negative colonoscopy 2012. Osteopenia declines treatment. Negative colonoscopy 2012. Current on immunizations. Hypercholesteremia taking niacin, red Yeast rice and fish oil. Hypothyroid/hypertension primary care manages  Past medical history, past surgical history, family history and social history were all reviewed and documented in the EPIC chart. 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 Originally from [place]. Mother, brother diabetes, mother 67 lives independently, father died at age 49.  . ROS:  A  12 POINT ROS was performed and pertinent positives and negatives are included.  Exam:  Filed Vitals:   12/29/13 0928  BP: 108/64    General appearance:  Normal Thyroid:  Symmetrical, normal in size, without palpable masses or nodularity. Respiratory  Auscultation:  Clear without wheezing or rhonchi Cardiovascular  Auscultation:  Regular rate, without rubs, murmurs or gallops  Edema/varicosities:  Not grossly evident Abdominal  Soft,nontender, without masses, guarding or rebound.  Liver/spleen:  No organomegaly noted  Hernia:  None appreciated  Skin  Inspection:  Grossly normal   Breasts: Examined lying and sitting.     Right: Without masses, retractions, discharge or axillary adenopathy.     Left: Without masses, retractions, discharge or axillary adenopathy. Gentitourinary   Inguinal/mons:  Normal without inguinal adenopathy  External genitalia:  Normal  BUS/Urethra/Skene's glands:  Normal  Vagina:  Atrophic  Cervix: Absent  Uterus:  Absent  Adnexa/parametria:     Rt: Without masses or tenderness.   Lt: Without masses or tenderness.  Anus and perineum: Normal  Digital rectal exam: Normal sphincter tone without palpated masses or tenderness  Assessment/Plan:  69 y.o. MWF G3P3  for breast and pelvic exam with complaint of dry cough for one month.  TVH on no HRT Osteopenia declines treatment Hypothyroid/hypertension-meds and labs primary care  Plan: Will try Zyrtec for questionable seasonal allergies, if no relief followup with primary care. SBE's, continue annual mammogram, calcium rich diet, vitamin D 2000 daily encouraged. Continue healthy lifestyle of regular exercise and diet. Home safety and fall prevention discussed. Continue vaginal lubricants with intercourse.    [place], 10:37 AM 12/29/2013

## 2014-01-07 DIAGNOSIS — R51 Headache: Secondary | ICD-10-CM | POA: Diagnosis not present

## 2014-01-07 DIAGNOSIS — E785 Hyperlipidemia, unspecified: Secondary | ICD-10-CM | POA: Diagnosis not present

## 2014-01-07 DIAGNOSIS — E039 Hypothyroidism, unspecified: Secondary | ICD-10-CM | POA: Diagnosis not present

## 2014-01-07 DIAGNOSIS — R609 Edema, unspecified: Secondary | ICD-10-CM | POA: Diagnosis not present

## 2014-04-05 DIAGNOSIS — N390 Urinary tract infection, site not specified: Secondary | ICD-10-CM | POA: Diagnosis not present

## 2014-05-05 DIAGNOSIS — R05 Cough: Secondary | ICD-10-CM | POA: Diagnosis not present

## 2014-05-05 DIAGNOSIS — R059 Cough, unspecified: Secondary | ICD-10-CM | POA: Diagnosis not present

## 2014-05-05 DIAGNOSIS — J0100 Acute maxillary sinusitis, unspecified: Secondary | ICD-10-CM | POA: Diagnosis not present

## 2014-05-11 DIAGNOSIS — J209 Acute bronchitis, unspecified: Secondary | ICD-10-CM | POA: Diagnosis not present

## 2014-05-11 DIAGNOSIS — J0100 Acute maxillary sinusitis, unspecified: Secondary | ICD-10-CM | POA: Diagnosis not present

## 2014-05-31 DIAGNOSIS — R059 Cough, unspecified: Secondary | ICD-10-CM | POA: Diagnosis not present

## 2014-05-31 DIAGNOSIS — R05 Cough: Secondary | ICD-10-CM | POA: Diagnosis not present

## 2014-06-24 ENCOUNTER — Encounter: Payer: Self-pay | Admitting: Gynecology

## 2014-06-24 ENCOUNTER — Ambulatory Visit (INDEPENDENT_AMBULATORY_CARE_PROVIDER_SITE_OTHER): Payer: Medicare Other | Admitting: Gynecology

## 2014-06-24 DIAGNOSIS — N644 Mastodynia: Secondary | ICD-10-CM

## 2014-06-24 NOTE — Patient Instructions (Signed)
Office will contact you to arrange for the mammogram and ultrasound. Follow up with me if this pain continues.

## 2014-06-24 NOTE — Progress Notes (Signed)
Sandra Ellis Aug 21, 1946 [id number]        68 y.o.  G3P3 presents with right tail of Spence breast pain over the last several weeks. Has history of similar pains when she was premenopausal but since postmenopausal really has not felt this until now. No identifiable precipitating events.  She has not palpated any abnormalities. No nipple discharge.  Past medical history,surgical history, problem list, medications, allergies, family history and social history were all reviewed and documented in the EPIC chart.  Directed ROS with pertinent positives and negatives documented in the history of present illness/assessment and plan.  Exam: Kim assistant General appearance:  Normal Both breasts examined lying and sitting without masses, retractions, discharge or adenopathy.  Assessment/Plan:  68 y.o. G3P3 right tail of Spence breast pain with normal exam. Recommend diagnostic mammography and ultrasound over this area. Heat and OTC pain medication. Assuming studies are negative follow up if the pain persists or any palpable abnormalities.     Anastasio Auerbach MD, 11:39 AM 06/24/2014

## 2014-06-27 ENCOUNTER — Telehealth: Payer: Self-pay | Admitting: *Deleted

## 2014-06-27 NOTE — Telephone Encounter (Signed)
Appointment on 07/01/14 @ 10:45 am , order faxed. I asked pt to call me to confirm she received my message.

## 2014-06-27 NOTE — Telephone Encounter (Signed)
Message copied by Thamas Jaegers on Mon Jun 27, 2014  1:52 PM ------      Message from: Anastasio Auerbach      Created: Fri Jun 24, 2014 11:38 AM       Schedule diagnostic bilateral mammogram and ultrasound of the right tail of Spence reference new onset right tail of Spence breast pain. Last mammogram one year ago ------

## 2014-06-28 NOTE — Telephone Encounter (Signed)
Pt called back to let me know she received my message.

## 2014-07-01 DIAGNOSIS — H01004 Unspecified blepharitis left upper eyelid: Secondary | ICD-10-CM | POA: Diagnosis not present

## 2014-07-01 DIAGNOSIS — R921 Mammographic calcification found on diagnostic imaging of breast: Secondary | ICD-10-CM | POA: Diagnosis not present

## 2014-07-01 DIAGNOSIS — H43813 Vitreous degeneration, bilateral: Secondary | ICD-10-CM | POA: Diagnosis not present

## 2014-07-01 DIAGNOSIS — H01001 Unspecified blepharitis right upper eyelid: Secondary | ICD-10-CM | POA: Diagnosis not present

## 2014-07-01 DIAGNOSIS — H2513 Age-related nuclear cataract, bilateral: Secondary | ICD-10-CM | POA: Diagnosis not present

## 2014-07-01 DIAGNOSIS — N644 Mastodynia: Secondary | ICD-10-CM | POA: Diagnosis not present

## 2014-07-04 ENCOUNTER — Encounter: Payer: Self-pay | Admitting: Gynecology

## 2014-07-04 ENCOUNTER — Encounter: Payer: Self-pay | Admitting: Women's Health

## 2014-07-07 DIAGNOSIS — E039 Hypothyroidism, unspecified: Secondary | ICD-10-CM | POA: Diagnosis not present

## 2014-07-07 DIAGNOSIS — Z23 Encounter for immunization: Secondary | ICD-10-CM | POA: Diagnosis not present

## 2014-07-14 DIAGNOSIS — E039 Hypothyroidism, unspecified: Secondary | ICD-10-CM | POA: Diagnosis not present

## 2014-07-14 DIAGNOSIS — R002 Palpitations: Secondary | ICD-10-CM | POA: Diagnosis not present

## 2014-07-14 DIAGNOSIS — E785 Hyperlipidemia, unspecified: Secondary | ICD-10-CM | POA: Diagnosis not present

## 2014-07-15 DIAGNOSIS — E039 Hypothyroidism, unspecified: Secondary | ICD-10-CM | POA: Diagnosis not present

## 2014-07-15 DIAGNOSIS — R002 Palpitations: Secondary | ICD-10-CM | POA: Diagnosis not present

## 2014-07-15 DIAGNOSIS — E785 Hyperlipidemia, unspecified: Secondary | ICD-10-CM | POA: Diagnosis not present

## 2014-07-19 DIAGNOSIS — R002 Palpitations: Secondary | ICD-10-CM | POA: Diagnosis not present

## 2014-09-15 ENCOUNTER — Ambulatory Visit (INDEPENDENT_AMBULATORY_CARE_PROVIDER_SITE_OTHER): Payer: Medicare Other | Admitting: Women's Health

## 2014-09-15 ENCOUNTER — Telehealth: Payer: Self-pay | Admitting: *Deleted

## 2014-09-15 ENCOUNTER — Encounter: Payer: Self-pay | Admitting: Women's Health

## 2014-09-15 VITALS — BP 130/74 | Ht 67.0 in | Wt 140.0 lb

## 2014-09-15 DIAGNOSIS — N63 Unspecified lump in breast: Secondary | ICD-10-CM | POA: Diagnosis not present

## 2014-09-15 DIAGNOSIS — R350 Frequency of micturition: Secondary | ICD-10-CM

## 2014-09-15 DIAGNOSIS — N6001 Solitary cyst of right breast: Secondary | ICD-10-CM | POA: Diagnosis not present

## 2014-09-15 LAB — URINALYSIS W MICROSCOPIC + REFLEX CULTURE
Bilirubin Urine: NEGATIVE
Casts: NONE SEEN
Crystals: NONE SEEN
Glucose, UA: NEGATIVE mg/dL
Ketones, ur: NEGATIVE mg/dL
Nitrite: NEGATIVE
Protein, ur: NEGATIVE mg/dL
Specific Gravity, Urine: 1.01 (ref 1.005–1.030)
Urobilinogen, UA: 0.2 mg/dL (ref 0.0–1.0)
pH: 6 (ref 5.0–8.0)

## 2014-09-15 MED ORDER — SULFAMETHOXAZOLE-TRIMETHOPRIM 800-160 MG PO TABS: 1.0000 | ORAL_TABLET | Freq: Two times a day (BID) | ORAL | Status: DC

## 2014-09-15 NOTE — Progress Notes (Signed)
Patient ID: Sandra Ellis, female   DOB: [birth year], 69 y.o.   MRN: [id number] Presents with new onset right breast discoloration with palpable nodule 1 day. Normal mammogram history. TVH on no HRT. Denies nipple discharge, known injury or breast pain. Increased urinary pressure and urgency for 2 days. Denies pain or burning. Denies vaginal discharge.  Exam: Sitting and lying position 3 cm mildly discolored erythematous area right upper outer breast 1 cm mobile nontender firm nodule. No visible dimpling, retractions, UA: Small leukocytes, 11-20 WBCs, few bacteria.  Right breast nodule  urinary pressure  Plan: Urine culture pending. Leaving on a trip tomorrow prescription for Septra twice daily for 3 days #6 given. Will call with culture results. Diagnostic mammogram with ultrasound today at 1:30 at [facility].

## 2014-09-15 NOTE — Telephone Encounter (Signed)
Pt scheduled for today at 1:30pm at [facility], order faxed and pt will bring order in as well.

## 2014-09-15 NOTE — Telephone Encounter (Signed)
-----   Message from Huel Cote, NP sent at 09/15/2014 12:03 PM EST ----- Please schedule diagnostic mammogram/US left breast 1 cm firm nodule upper outer quadrant either today or early am Friday or after Jan 25 (will be out of town) [employer]

## 2014-09-16 ENCOUNTER — Encounter: Payer: Self-pay | Admitting: Women's Health

## 2014-09-18 LAB — URINE CULTURE: Colony Count: 75000

## 2014-09-27 DIAGNOSIS — Z86010 Personal history of colonic polyps: Secondary | ICD-10-CM | POA: Diagnosis not present

## 2014-09-27 DIAGNOSIS — Z1211 Encounter for screening for malignant neoplasm of colon: Secondary | ICD-10-CM | POA: Diagnosis not present

## 2014-09-29 DIAGNOSIS — E039 Hypothyroidism, unspecified: Secondary | ICD-10-CM | POA: Diagnosis not present

## 2014-09-29 DIAGNOSIS — E785 Hyperlipidemia, unspecified: Secondary | ICD-10-CM | POA: Diagnosis not present

## 2014-09-29 DIAGNOSIS — R002 Palpitations: Secondary | ICD-10-CM | POA: Diagnosis not present

## 2014-10-02 DIAGNOSIS — R05 Cough: Secondary | ICD-10-CM | POA: Diagnosis not present

## 2014-10-02 DIAGNOSIS — J029 Acute pharyngitis, unspecified: Secondary | ICD-10-CM | POA: Diagnosis not present

## 2014-10-02 DIAGNOSIS — R509 Fever, unspecified: Secondary | ICD-10-CM | POA: Diagnosis not present

## 2014-10-03 ENCOUNTER — Telehealth: Payer: Self-pay

## 2014-10-03 NOTE — Telephone Encounter (Signed)
-----   Message from Huel Cote, NP sent at 10/03/2014  8:13 AM EST ----- Please call urine culture was positive for some bacteria, was given Rx at office visit. Ask if any further problems.

## 2014-10-03 NOTE — Telephone Encounter (Signed)
Augmentin effective.

## 2014-10-03 NOTE — Telephone Encounter (Signed)
Patient informed per below.  She said "I don't think I have it anymore".  She went on to say she did not take the Rx you gave her at visit. She is on Augmentin for sinus infection and thinks that took care of it.  I told her I would let you know.

## 2014-10-19 DIAGNOSIS — J209 Acute bronchitis, unspecified: Secondary | ICD-10-CM | POA: Diagnosis not present

## 2014-11-09 DIAGNOSIS — Z832 Family history of diseases of the blood and blood-forming organs and certain disorders involving the immune mechanism: Secondary | ICD-10-CM | POA: Diagnosis not present

## 2014-11-09 DIAGNOSIS — K648 Other hemorrhoids: Secondary | ICD-10-CM | POA: Diagnosis not present

## 2014-11-09 DIAGNOSIS — Z86010 Personal history of colonic polyps: Secondary | ICD-10-CM | POA: Diagnosis not present

## 2014-11-09 DIAGNOSIS — D120 Benign neoplasm of cecum: Secondary | ICD-10-CM | POA: Diagnosis not present

## 2014-11-09 DIAGNOSIS — E039 Hypothyroidism, unspecified: Secondary | ICD-10-CM | POA: Diagnosis not present

## 2014-11-09 DIAGNOSIS — Z1211 Encounter for screening for malignant neoplasm of colon: Secondary | ICD-10-CM | POA: Diagnosis not present

## 2014-11-09 DIAGNOSIS — K649 Unspecified hemorrhoids: Secondary | ICD-10-CM | POA: Diagnosis not present

## 2014-11-09 DIAGNOSIS — I10 Essential (primary) hypertension: Secondary | ICD-10-CM | POA: Diagnosis not present

## 2014-11-09 HISTORY — PX: COLONOSCOPY: SHX174

## 2014-11-18 ENCOUNTER — Encounter (INDEPENDENT_AMBULATORY_CARE_PROVIDER_SITE_OTHER): Payer: Self-pay

## 2014-11-18 ENCOUNTER — Ambulatory Visit (INDEPENDENT_AMBULATORY_CARE_PROVIDER_SITE_OTHER): Payer: Medicare Other | Admitting: Pulmonary Disease

## 2014-11-18 ENCOUNTER — Encounter: Payer: Self-pay | Admitting: Pulmonary Disease

## 2014-11-18 VITALS — BP 122/72 | HR 55 | Temp 97.0°F | Ht 67.0 in | Wt 136.8 lb

## 2014-11-18 DIAGNOSIS — R911 Solitary pulmonary nodule: Secondary | ICD-10-CM | POA: Diagnosis not present

## 2014-11-18 NOTE — Progress Notes (Signed)
   Subjective:    Patient ID: Sandra Ellis, female    DOB: 1945-11-22, 69 y.o.   MRN: [id number]  HPI The patient is a 69 year old female who I've been asked to see for pulmonary nodule. She has had a recent chest x-ray 2 views report shows a 5 mm rounded nodule in the right midlung zone and a 2 mm right apical nodule as well. They are most consistent with old granulomatous disease according to the report. She has had old x-rays from 2009 in 2011 which have shown 2 nodules on the right side as well. I do not have her recent CT to review to compare to her older films. The patient states that she lived in [place] for 23 years, and denies any obvious TB exposure. She has had a negative PPD recently. Her health maintenance is completely up-to-date, and she has had no weight loss or anorexia. He has a history of smoking half a pack per day for 8 years, but has not done so since 1975. She denies any chronic cough, shortness of breath, or other consistent pulmonary symptoms.   Review of Systems  Constitutional: Negative for fever, chills and unexpected weight change.  HENT: Positive for dental problem. Negative for congestion, ear pain, nosebleeds, postnasal drip, rhinorrhea, sinus pressure, sneezing, sore throat, trouble swallowing and voice change.   Eyes: Negative for redness, itching and visual disturbance.  Respiratory: Positive for cough. Negative for choking, chest tightness, shortness of breath and wheezing.   Cardiovascular: Negative for chest pain, palpitations and leg swelling.  Gastrointestinal: Negative for nausea, vomiting, abdominal pain and diarrhea.  Genitourinary: Negative for dysuria and difficulty urinating.  Musculoskeletal: Negative for joint swelling and arthralgias.  Skin: Negative for rash.  Neurological: Negative for tremors, syncope and headaches.  Hematological: Does not bruise/bleed easily.  Psychiatric/Behavioral: Negative for dysphoric mood. The patient is not  nervous/anxious.        Objective:   Physical Exam Constitutional:  Well developed, no acute distress  HENT:  Nares patent without discharge  Oropharynx without exudate, palate and uvula are normal  Eyes:  Perrla, eomi, no scleral icterus  Neck:  No JVD, no TMG  Cardiovascular:  Normal rate, regular rhythm, no rubs or gallops.  No murmurs        Intact distal pulses  Pulmonary :  Normal breath sounds, no stridor or respiratory distress   No rales, rhonchi, or wheezing  Abdominal:  Soft, nondistended, bowel sounds present.  No tenderness noted.   Musculoskeletal:  No lower extremity edema noted.  Lymph Nodes:  No cervical lymphadenopathy noted  Skin:  No cyanosis noted  Neurologic:  Alert, appropriate, moves all 4 extremities without obvious deficit.         Assessment & Plan:

## 2014-11-18 NOTE — Assessment & Plan Note (Signed)
The patient has had a recent chest x-ray that showed some pulmonary nodules. However, I have reviewed her old films from 2009 and 2011, and these do show smooth and rounded nodules that are most consistent with old granulomatous disease. She tells me that her health maintenance is up-to-date, and has had a recent negative PPD. She has minimal smoking history, and has not done so since 1975. The key piece of information is that she is from [place], which is an endemic area for histoplasmosis. 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 I have asked the patient to have her recent chest x-ray put on a disc so that I can review and compare to her prior films to make sure it is the same nodules. If so, no further follow-up is necessary.

## 2014-11-18 NOTE — Patient Instructions (Signed)
Please get a disk with a copy of your most recent chest xray so I can compare to priors.  Will call you afterwards.  followup with me as needed

## 2014-11-24 DIAGNOSIS — E780 Pure hypercholesterolemia: Secondary | ICD-10-CM | POA: Diagnosis not present

## 2014-11-24 DIAGNOSIS — E039 Hypothyroidism, unspecified: Secondary | ICD-10-CM | POA: Diagnosis not present

## 2014-11-24 DIAGNOSIS — E049 Nontoxic goiter, unspecified: Secondary | ICD-10-CM | POA: Diagnosis not present

## 2014-11-24 DIAGNOSIS — G43909 Migraine, unspecified, not intractable, without status migrainosus: Secondary | ICD-10-CM | POA: Diagnosis not present

## 2014-11-24 DIAGNOSIS — J309 Allergic rhinitis, unspecified: Secondary | ICD-10-CM | POA: Diagnosis not present

## 2014-11-24 DIAGNOSIS — I10 Essential (primary) hypertension: Secondary | ICD-10-CM | POA: Diagnosis not present

## 2014-12-06 ENCOUNTER — Telehealth: Payer: Self-pay

## 2014-12-06 ENCOUNTER — Telehealth: Payer: Self-pay | Admitting: Pulmonary Disease

## 2014-12-06 NOTE — Telephone Encounter (Signed)
lmtcb X1 to make pt aware.  

## 2014-12-06 NOTE — Telephone Encounter (Signed)
Please let pt know that I have reviewed her disk, and the cxr looks identical to her older films.  Good news.

## 2014-12-06 NOTE — Telephone Encounter (Signed)
12/06/14 Disc received on dumbwaiter from [employer] and filed on shelf.Britt Bottom

## 2014-12-07 NOTE — Telephone Encounter (Signed)
Pt returning call.Sandra Ellis ° °

## 2014-12-07 NOTE — Telephone Encounter (Signed)
Spoke with pt, aware of below results.  Nothing further needed.

## 2015-01-05 DIAGNOSIS — S63501A Unspecified sprain of right wrist, initial encounter: Secondary | ICD-10-CM | POA: Diagnosis not present

## 2015-01-05 DIAGNOSIS — S6991XA Unspecified injury of right wrist, hand and finger(s), initial encounter: Secondary | ICD-10-CM | POA: Diagnosis not present

## 2015-03-17 ENCOUNTER — Other Ambulatory Visit: Payer: Self-pay | Admitting: Gynecology

## 2015-03-17 ENCOUNTER — Telehealth: Payer: Self-pay

## 2015-03-17 MED ORDER — SULFAMETHOXAZOLE-TRIMETHOPRIM 800-160 MG PO TABS: 1.0000 | ORAL_TABLET | Freq: Two times a day (BID) | ORAL | Status: DC

## 2015-03-17 NOTE — Telephone Encounter (Signed)
Septra DS 1 by mouth twice a day 3 days. Needs to make appointment for annual exam.

## 2015-03-17 NOTE — Telephone Encounter (Signed)
[place] Patient but you are backup MD. Patient called complaining of UTI. Familiar with symptoms as has has before.  Currently c/o pressure and frequency. Asked if she could get Rx. CE was due in April so she is overdue annual exam.  Allergic to Levaquin.

## 2015-03-17 NOTE — Telephone Encounter (Signed)
Patient informed. Rx sent. CE appt scheduled.

## 2015-04-04 ENCOUNTER — Ambulatory Visit (INDEPENDENT_AMBULATORY_CARE_PROVIDER_SITE_OTHER): Payer: Medicare Other | Admitting: Women's Health

## 2015-04-04 ENCOUNTER — Encounter: Payer: Self-pay | Admitting: Women's Health

## 2015-04-04 VITALS — BP 116/72 | Ht 67.0 in | Wt 137.0 lb

## 2015-04-04 DIAGNOSIS — N952 Postmenopausal atrophic vaginitis: Secondary | ICD-10-CM | POA: Diagnosis not present

## 2015-04-04 NOTE — Progress Notes (Signed)
Sandra Ellis 1946-01-24 [id number]    History:    Presents for breast and pelvic exam. TAH for DUB on no HRT. Normal Pap and mammogram history. 09/2014 mammogram normal after ultrasound. 2016 negative colonoscopy. Hypothyroid and hypertension on primary care manages. Osteopenia declines treatment.  Past medical history, past surgical history, family history and social history were all reviewed and documented in the EPIC chart. Development worker, international aid, does mission work. Originally from [place]. Mother brother diabetes. Father died age 89, mother doing well at age 67.  ROS:  A ROS was performed and pertinent positives and negatives are included.  Exam:  Filed Vitals:   04/04/15 1001  BP: 116/72    General appearance:  Normal Thyroid:  Symmetrical, normal in size, without palpable masses or nodularity. Respiratory  Auscultation:  Clear without wheezing or rhonchi Cardiovascular  Auscultation:  Regular rate, without rubs, murmurs or gallops  Edema/varicosities:  Not grossly evident Abdominal  Soft,nontender, without masses, guarding or rebound.  Liver/spleen:  No organomegaly noted  Hernia:  None appreciated  Skin  Inspection:  Grossly normal   Breasts: Examined lying and sitting.     Right: Without masses, retractions, discharge or axillary adenopathy.     Left: Without masses, retractions, discharge or axillary adenopathy. Gentitourinary   Inguinal/mons:  Normal without inguinal adenopathy  External genitalia:  Normal  BUS/Urethra/Skene's glands:  Normal  Vagina:  Atrophic  Cervix:  Uterus absent  Adnexa/parametria:     Rt: Without masses or tenderness.   Lt: Without masses or tenderness.  Anus and perineum: Normal  Digital rectal exam: Normal sphincter tone without palpated masses or tenderness  Assessment/Plan:  69 y.o. MWF G3 P3 for breast and pelvic exam with no complaints.  TAH no HRT Vaginal atrophy Osteopenia-declines  treatment Hypothyroid/hypertension-primary care manages labs and meds  Plan: SBE's, continue annual screening mammogram, calcium rich diet, vitamin D 2000 daily encouraged. Home safety, fall prevention and importance of regular exercise encouraged. Continue healthy lifestyle of diet and exercise. Encouraged Zostavax, has had Pneumovax. Pap screening guidelines reviewed. Continue lubricants with intercourse   Huel Cote [facility], 10:53 AM 04/04/2015

## 2015-04-04 NOTE — Patient Instructions (Signed)

## 2015-04-07 DIAGNOSIS — N201 Calculus of ureter: Secondary | ICD-10-CM | POA: Diagnosis not present

## 2015-04-07 DIAGNOSIS — R823 Hemoglobinuria: Secondary | ICD-10-CM | POA: Diagnosis not present

## 2015-04-07 DIAGNOSIS — R312 Other microscopic hematuria: Secondary | ICD-10-CM | POA: Diagnosis not present

## 2015-07-07 DIAGNOSIS — N3001 Acute cystitis with hematuria: Secondary | ICD-10-CM | POA: Diagnosis not present

## 2015-07-07 DIAGNOSIS — N3091 Cystitis, unspecified with hematuria: Secondary | ICD-10-CM | POA: Diagnosis not present

## 2015-08-02 DIAGNOSIS — I10 Essential (primary) hypertension: Secondary | ICD-10-CM | POA: Diagnosis not present

## 2015-08-02 DIAGNOSIS — E039 Hypothyroidism, unspecified: Secondary | ICD-10-CM | POA: Diagnosis not present

## 2015-08-02 DIAGNOSIS — E785 Hyperlipidemia, unspecified: Secondary | ICD-10-CM | POA: Diagnosis not present

## 2015-08-02 DIAGNOSIS — R002 Palpitations: Secondary | ICD-10-CM | POA: Diagnosis not present

## 2015-10-12 DIAGNOSIS — Z23 Encounter for immunization: Secondary | ICD-10-CM | POA: Diagnosis not present

## 2015-10-12 DIAGNOSIS — E039 Hypothyroidism, unspecified: Secondary | ICD-10-CM | POA: Diagnosis not present

## 2015-10-12 DIAGNOSIS — I10 Essential (primary) hypertension: Secondary | ICD-10-CM | POA: Diagnosis not present

## 2015-10-12 DIAGNOSIS — E785 Hyperlipidemia, unspecified: Secondary | ICD-10-CM | POA: Diagnosis not present

## 2015-10-12 DIAGNOSIS — Z0000 Encounter for general adult medical examination without abnormal findings: Secondary | ICD-10-CM | POA: Diagnosis not present

## 2015-10-13 DIAGNOSIS — Z1231 Encounter for screening mammogram for malignant neoplasm of breast: Secondary | ICD-10-CM | POA: Diagnosis not present

## 2015-10-18 ENCOUNTER — Encounter: Payer: Self-pay | Admitting: Women's Health

## 2015-11-28 ENCOUNTER — Ambulatory Visit: Payer: No Typology Code available for payment source | Admitting: Emergency Medicine

## 2015-12-07 DIAGNOSIS — B370 Candidal stomatitis: Secondary | ICD-10-CM | POA: Diagnosis not present

## 2015-12-07 DIAGNOSIS — L219 Seborrheic dermatitis, unspecified: Secondary | ICD-10-CM | POA: Diagnosis not present

## 2015-12-12 DIAGNOSIS — R7309 Other abnormal glucose: Secondary | ICD-10-CM | POA: Diagnosis not present

## 2015-12-12 DIAGNOSIS — K146 Glossodynia: Secondary | ICD-10-CM | POA: Diagnosis not present

## 2015-12-12 DIAGNOSIS — S90465A Insect bite (nonvenomous), left lesser toe(s), initial encounter: Secondary | ICD-10-CM | POA: Diagnosis not present

## 2015-12-19 DIAGNOSIS — B029 Zoster without complications: Secondary | ICD-10-CM | POA: Diagnosis not present

## 2015-12-22 DIAGNOSIS — B029 Zoster without complications: Secondary | ICD-10-CM | POA: Diagnosis not present

## 2015-12-22 DIAGNOSIS — L237 Allergic contact dermatitis due to plants, except food: Secondary | ICD-10-CM | POA: Diagnosis not present

## 2015-12-25 DIAGNOSIS — L500 Allergic urticaria: Secondary | ICD-10-CM | POA: Diagnosis not present

## 2015-12-25 DIAGNOSIS — L300 Nummular dermatitis: Secondary | ICD-10-CM | POA: Diagnosis not present

## 2015-12-27 DIAGNOSIS — N3090 Cystitis, unspecified without hematuria: Secondary | ICD-10-CM | POA: Diagnosis not present

## 2015-12-27 DIAGNOSIS — N3001 Acute cystitis with hematuria: Secondary | ICD-10-CM | POA: Diagnosis not present

## 2015-12-28 DIAGNOSIS — J209 Acute bronchitis, unspecified: Secondary | ICD-10-CM | POA: Diagnosis not present

## 2015-12-29 ENCOUNTER — Emergency Department (HOSPITAL_COMMUNITY)
Admission: EM | Admit: 2015-12-29 | Discharge: 2015-12-29 | Disposition: A | Discharge status: Home / Self Care | Payer: Medicare Other | Attending: Emergency Medicine | Admitting: Emergency Medicine

## 2015-12-29 ENCOUNTER — Encounter (HOSPITAL_COMMUNITY): Payer: Self-pay | Admitting: Emergency Medicine

## 2015-12-29 ENCOUNTER — Emergency Department (HOSPITAL_COMMUNITY): Payer: Medicare Other

## 2015-12-29 DIAGNOSIS — R059 Cough, unspecified: Secondary | ICD-10-CM

## 2015-12-29 DIAGNOSIS — R05 Cough: Secondary | ICD-10-CM | POA: Insufficient documentation

## 2015-12-29 DIAGNOSIS — E7800 Pure hypercholesterolemia, unspecified: Secondary | ICD-10-CM | POA: Insufficient documentation

## 2015-12-29 DIAGNOSIS — Z87891 Personal history of nicotine dependence: Secondary | ICD-10-CM | POA: Diagnosis not present

## 2015-12-29 DIAGNOSIS — Z79899 Other long term (current) drug therapy: Secondary | ICD-10-CM | POA: Insufficient documentation

## 2015-12-29 DIAGNOSIS — Z792 Long term (current) use of antibiotics: Secondary | ICD-10-CM | POA: Diagnosis not present

## 2015-12-29 DIAGNOSIS — Z7982 Long term (current) use of aspirin: Secondary | ICD-10-CM | POA: Diagnosis not present

## 2015-12-29 DIAGNOSIS — Z7951 Long term (current) use of inhaled steroids: Secondary | ICD-10-CM | POA: Diagnosis not present

## 2015-12-29 NOTE — ED Provider Notes (Signed)
CSN: [id number]     Arrival date & time 12/29/15  1701 History   First MD Initiated Contact with Patient 12/29/15 1907     Chief Complaint  Patient presents with  . bronchitis/cough      (Consider location/radiation/quality/duration/timing/severity/associated sxs/prior Treatment) HPI Sandra Ellis is a 70 y.o. female who comes in for evaluation of bronchitis and cough since Thursday. States she was seen by her PCP yesterday and diagnosed with a bronchitis, but wanted to come to ED for chest x-ray to ensure no pneumonia. She reports a nonproductive cough since Thursday, no fever, leg swelling, chest pain, Shortness of breath or other medical complaints. Reports she recently returned from a mission trip to [place] roughly 1 month ago. Currently on ciprofloxacin therapy for a UTI. No other modifying factors.  Past Medical History  Diagnosis Date  . H/O blood clots 1990  . Elevated cholesterol    Past Surgical History  Procedure Laterality Date  . Vaginal hysterectomy      TVH  . Tonisllectomy    . Knee surgery      cyst removed   Family History  Problem Relation Age of Onset  . Diabetes Mother   . Asthma Mother    Social History  Substance Use Topics  . Smoking status: Former Smoker -- 0.50 packs/day for 8 years    Types: Cigarettes    Quit date: 09/02/1973  . Smokeless tobacco: None  . Alcohol Use: No   OB History    Gravida Para Term Preterm AB TAB SAB Ectopic Multiple Living   3 3        3      Review of Systems A 10 point review of systems was completed and was negative except for pertinent positives and negatives as mentioned in the history of present illness     Allergies  Levaquin  Home Medications   Prior to Admission medications   Medication Sig Start Date End Date Taking? Authorizing Provider  acetaminophen (TYLENOL) 500 MG tablet Take 500 mg by mouth daily as needed for moderate pain.   Yes Historical Provider, MD  albuterol (PROVENTIL  HFA;VENTOLIN HFA) 108 (90 Base) MCG/ACT inhaler Inhale 2 puffs into the lungs every 4 (four) hours as needed for wheezing or shortness of breath.   Yes Historical Provider, MD  Ascorbic Acid (VITAMIN C PO) Take 1 tablet by mouth daily.   Yes Historical Provider, MD  aspirin 81 MG tablet Take 162 mg by mouth daily.    Yes Historical Provider, MD  atenolol (TENORMIN) 25 MG tablet Take 6.25 mg by mouth daily.    Yes Historical Provider, MD  azithromycin (ZITHROMAX) 250 MG tablet z pack 12/28/15  Yes Historical Provider, MD  ciprofloxacin (CIPRO) 500 MG tablet Take 1 tablet by mouth 2 (two) times daily. 12/27/15  Yes Historical Provider, MD  diphenhydrAMINE (SOMINEX) 25 MG tablet Take 50 mg by mouth every 4 (four) hours as needed for itching or allergies.   Yes Historical Provider, MD  hydrochlorothiazide (HYDRODIURIL) 25 MG tablet Take 25 mg by mouth daily.   Yes Historical Provider, MD  levothyroxine (SYNTHROID, LEVOTHROID) 75 MCG tablet Take 75 mcg by mouth daily.   Yes Historical Provider, MD  NIACIN CR PO Take 1 tablet by mouth daily.    Yes Historical Provider, MD  omega-3 acid ethyl esters (LOVAZA) 1 G capsule 4 caps daily   Yes Historical Provider, MD  OVER THE COUNTER MEDICATION Take 4 each by mouth daily. Juicy juice multivitamin -  chewable 2 vegetable and 2 fruit.   Yes Historical Provider, MD  POTASSIUM PO Take 1 tablet by mouth daily.    Yes Historical Provider, MD  Red Yeast Rice Extract (RED YEAST RICE PO) Take 1 tablet by mouth daily.    Yes Historical Provider, MD  Triamcinolone Acetonide (KENALOG IJ) Inject 1 each as directed once.   Yes Historical Provider, MD  triamcinolone cream (KENALOG) 0.1 % Apply 1 application topically 4 (four) times daily as needed. rash 12/19/15  Yes Historical Provider, MD  sulfamethoxazole-trimethoprim (BACTRIM DS,SEPTRA DS) 800-160 MG per tablet Take 1 tablet by mouth 2 (two) times daily. Patient not taking: Reported on 12/29/2015 03/17/15   Anastasio Auerbach,  MD   BP 129/56 mmHg  Pulse 52  Temp(Src) 97.9 F (36.6 C) (Oral)  Resp 16  Ht 5\' 7"  (1.702 m)  Wt 62.143 kg  BMI 21.45 kg/m2  SpO2 100% Physical Exam  Constitutional: She is oriented to person, place, and time. She appears well-developed and well-nourished.  HENT:  Head: Normocephalic and atraumatic.  Mouth/Throat: Oropharynx is clear and moist.  Eyes: Conjunctivae are normal. Pupils are equal, round, and reactive to light. Right eye exhibits no discharge. Left eye exhibits no discharge. No scleral icterus.  Neck: Neck supple.  Cardiovascular: Normal rate, regular rhythm and normal heart sounds.   Pulmonary/Chest: Effort normal and breath sounds normal. No respiratory distress. She has no wheezes. She has no rales.  Mild diffuse wheezing in left upper lobe. Otherwise no adventitious lung sounds or evidence of respiratory distress  Abdominal: Soft. There is no tenderness.  Musculoskeletal: She exhibits no tenderness.  Neurological: She is alert and oriented to person, place, and time.  Cranial Nerves II-XII grossly intact  Skin: Skin is warm and dry. No rash noted.  Psychiatric: She has a normal mood and affect.  Nursing note and vitals reviewed.   ED Course  Procedures (including critical care time) Labs Review Labs Reviewed - No data to display  Imaging Review Dg Chest 2 View  12/29/2015  CLINICAL DATA:  70 year old female with history of cough and burning in the chest for the past 2 days. Recently diagnosis with bronchitis. EXAM: CHEST  2 VIEW COMPARISON:  Chest x-ray 09/12/2009. FINDINGS: Calcified granulomas again noted in the right lung, unchanged compared to the prior study. Lung volumes are normal. No consolidative airspace disease. No pleural effusions. No pneumothorax. No pulmonary nodule or mass noted. Pulmonary vasculature and the cardiomediastinal silhouette are within normal limits. IMPRESSION: No radiographic evidence of acute cardiopulmonary disease. Electronically  Signed   By: Vinnie Langton M.D.   On: 12/29/2015 17:37   I have personally reviewed and evaluated these images and lab results as part of my medical decision-making.   EKG Interpretation None     Filed Vitals:   12/29/15 1707 12/29/15 1947  BP: 137/57 129/56  Pulse: 62 52  Temp: 97.9 F (36.6 C)   TempSrc: Oral   Resp: 17 16  Height: 5\' 7"  (1.702 m)   Weight: 62.143 kg   SpO2: 99% 100%    MDM  Dalani Mohl is a 70 y.o. female  who presents for evaluation of cough/bronchitis, symptom onset Thursday. On arrival, she is hemodynamically stable and afebrile to 99% on room air. Exam reveals mild wheezing in left upper lobe, otherwise unremarkable cardiac pulmonary and physical exam. Chest x-ray is unchanged from prior and shows no acute cardio pulmonary pathology.Symptoms most consistent with a mild bronchitis, likely viral in etiology. Discussed  results and ED course the patient. She prefers to follow-up PCP next week. States she still has a albuterol inhaler at home and does not need a refill. Voices no other questions or concerns at this time, amenable to plan and discharge. Final diagnoses:  Cough        Comer Locket, PA-C 12/29/15 2247  Virgel Manifold, MD 01/03/16 1346

## 2015-12-29 NOTE — ED Notes (Signed)
Per patient, states he was diagnosed with Bronchitis yesterday-states she thinks it might be something else-states was on a mission trip to the [place] and got back 3 weeks ago

## 2015-12-29 NOTE — ED Notes (Signed)
PT DISCHARGED. INSTRUCTIONS GIVEN. AAOX3. PT IN NO APPARENT DISTRESS OR PAIN. THE OPPORTUNITY TO ASK QUESTIONS WAS PROVIDED. 

## 2015-12-29 NOTE — Discharge Instructions (Signed)
There does not appear to be an emergent cause for your symptoms at this time. Your symptoms are possibly due to bronchitis. Your exam was reassuring in your chest x-ray showed no acute findings. You may continue using your albuterol inhaler to help with wheezing. Follow-up with your doctor as needed. Return to ED for any new or worsening symptoms as we discussed.  Cough, Adult Coughing is a reflex that clears your throat and your airways. Coughing helps to heal and protect your lungs. It is normal to cough occasionally, but a cough that happens with other symptoms or lasts a long time may be a sign of a condition that needs treatment. A cough may last only 2-3 weeks (acute), or it may last longer than 8 weeks (chronic). CAUSES Coughing is commonly caused by:  Breathing in substances that irritate your lungs.  A viral or bacterial respiratory infection.  Allergies.  Asthma.  Postnasal drip.  Smoking.  Acid backing up from the stomach into the esophagus (gastroesophageal reflux).  Certain medicines.  Chronic lung problems, including COPD (or rarely, lung cancer).  Other medical conditions such as heart failure. HOME CARE INSTRUCTIONS  Pay attention to any changes in your symptoms. Take these actions to help with your discomfort:  Take medicines only as told by your health care provider.  If you were prescribed an antibiotic medicine, take it as told by your health care provider. Do not stop taking the antibiotic even if you start to feel better.  Talk with your health care provider before you take a cough suppressant medicine.  Drink enough fluid to keep your urine clear or pale yellow.  If the air is dry, use a cold steam vaporizer or humidifier in your bedroom or your home to help loosen secretions.  Avoid anything that causes you to cough at work or at home.  If your cough is worse at night, try sleeping in a semi-upright position.  Avoid cigarette smoke. If you smoke,  quit smoking. If you need help quitting, ask your health care provider.  Avoid caffeine.  Avoid alcohol.  Rest as needed. SEEK MEDICAL CARE IF:   You have new symptoms.  You cough up pus.  Your cough does not get better after 2-3 weeks, or your cough gets worse.  You cannot control your cough with suppressant medicines and you are losing sleep.  You develop pain that is getting worse or pain that is not controlled with pain medicines.  You have a fever.  You have unexplained weight loss.  You have night sweats. SEEK IMMEDIATE MEDICAL CARE IF:  You cough up blood.  You have difficulty breathing.  Your heartbeat is very fast.   This information is not intended to replace advice given to you by your health care provider. Make sure you discuss any questions you have with your health care provider.   Document Released: 02/15/2011 Document Revised: 05/10/2015 Document Reviewed: 10/26/2014 Elsevier Interactive Patient Education [employer].

## 2016-01-11 ENCOUNTER — Ambulatory Visit (INDEPENDENT_AMBULATORY_CARE_PROVIDER_SITE_OTHER): Payer: Medicare Other | Admitting: Acute Care

## 2016-01-11 ENCOUNTER — Encounter: Payer: Self-pay | Admitting: Acute Care

## 2016-01-11 VITALS — BP 110/70 | HR 52 | Ht 67.0 in | Wt 136.0 lb

## 2016-01-11 DIAGNOSIS — R0600 Dyspnea, unspecified: Secondary | ICD-10-CM | POA: Diagnosis not present

## 2016-01-11 DIAGNOSIS — R05 Cough: Secondary | ICD-10-CM

## 2016-01-11 DIAGNOSIS — R059 Cough, unspecified: Secondary | ICD-10-CM

## 2016-01-11 DIAGNOSIS — R0602 Shortness of breath: Secondary | ICD-10-CM

## 2016-01-11 LAB — NITRIC OXIDE: Nitric Oxide: 23

## 2016-01-11 MED ORDER — ALBUTEROL SULFATE HFA 108 (90 BASE) MCG/ACT IN AERS: 2.0000 | INHALATION_SPRAY | RESPIRATORY_TRACT | Status: DC | PRN

## 2016-01-11 NOTE — Progress Notes (Addendum)
History of Present Illness Sandra Ellis is a 70 y.o. female sen in 2016 by Dr. 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 PPD negative, minimal smoking history ( Quit 1975). She is from [place] which is an endemic area for histoplasmosis.   5/11/2017Acute Office Visit: Pt. Presents to the office today with complaints of lungs burning, and chest tightness. She was recently out of the country on a mission trip in the [place] where she was bitten by bugs, and then developed a rash.The rash has been worked up by several dermatologists with biopsy who felt the rash was shingles. She did get bronchitis while on the trip, she had a z-pack with her which she took. She was treated for a UTI with Cipro during this time also.She was seen in the ED 12/29/2015 for bronchitis and has been treated again with a z-pack. CXR at that time was clear.She denies fever, chest pain, orthopnea, hemoptysis, leg or calf pain. Cough is non-productive, she denies any discolored secretions. She is exhausted and has been caring for her 1 year old mother. We did discuss the need to rest as an important element  of getting better.  Tests 12/28/2015: CXR  Calcified granulomas again noted in the right lung, unchanged compared to the prior study. Lung volumes are normal. No consolidative airspace disease. No pleural effusions. No pneumothorax. No pulmonary nodule or mass noted. Pulmonary vasculature and the cardiomediastinal silhouette are within normal limits.  IMPRESSION: No radiographic evidence of acute cardiopulmonary disease.  Past medical hx Past Medical History  Diagnosis Date  . H/O blood clots 1990  . Elevated cholesterol      Past surgical hx, Family hx, Social hx all reviewed.  Current Outpatient Prescriptions on File Prior to Visit  Medication Sig  . acetaminophen (TYLENOL) 500 MG tablet Take 500 mg by mouth daily as needed for moderate pain.    Marland Kitchen albuterol (PROVENTIL HFA;VENTOLIN HFA) 108 (90 Base) MCG/ACT inhaler Inhale 2 puffs into the lungs every 4 (four) hours as needed for wheezing or shortness of breath.  . Ascorbic Acid (VITAMIN C PO) Take 1 tablet by mouth daily.  Marland Kitchen aspirin 81 MG tablet Take 162 mg by mouth daily.   Marland Kitchen atenolol (TENORMIN) 25 MG tablet Take 6.25 mg by mouth daily.   . hydrochlorothiazide (HYDRODIURIL) 25 MG tablet Take 25 mg by mouth daily.  Marland Kitchen levothyroxine (SYNTHROID, LEVOTHROID) 75 MCG tablet Take 75 mcg by mouth daily.  Marland Kitchen NIACIN CR PO Take 1 tablet by mouth daily.   Marland Kitchen OVER THE COUNTER MEDICATION Take 4 each by mouth daily. Juicy juice multivitamin - chewable 2 vegetable and 2 fruit.  Marland Kitchen POTASSIUM PO Take 1 tablet by mouth daily.   . Red Yeast Rice Extract (RED YEAST RICE PO) Take 1 tablet by mouth daily.   Marland Kitchen triamcinolone cream (KENALOG) 0.1 % Apply 1 application topically 4 (four) times daily as needed. rash   No current facility-administered medications on file prior to visit.     Allergies  Allergen Reactions  . Levaquin [Levofloxacin In D5w]     NUMBNESS    Review Of Systems:  Constitutional:   No  weight loss, night sweats,  Fevers, chills, fatigue, or  lassitude.  HEENT:   No headaches,  Difficulty swallowing,  Tooth/dental problems, or  Sore throat,                No sneezing, itching, ear ache, nasal congestion, post nasal drip,  CV:  No chest pain,  Orthopnea, PND, +swelling in lower extremities, anasarca, dizziness, palpitations, syncope.   GI  No heartburn, indigestion, abdominal pain, nausea, vomiting, diarrhea, change in bowel habits, loss of appetite, bloody stools.   Resp: + shortness of breath with exertion and at rest.  No excess mucus, no productive cough,  No non-productive cough,  No coughing up of blood.  No change in color of mucus.  + wheezing.  No chest wall deformity  Skin: no rash or lesions.  GU: no dysuria, change in color of urine, no urgency or frequency.  No  flank pain, no hematuria   MS:  No joint pain or swelling.  No decreased range of motion.  No back pain.  Psych:  No change in mood or affect. No depression or anxiety.  No memory loss.   Vital Signs BP 110/70 mmHg  Pulse 52  Ht 5\' 7"  (1.702 m)  Wt 136 lb (61.689 kg)  BMI 21.30 kg/m2  SpO2 97%   Physical Exam:  General- No distress,  A&Ox3 ENT: No sinus tenderness, TM clear, pale nasal mucosa, no oral exudate,no post nasal drip, no LAN Cardiac: S1, S2, regular rate and rhythm, no murmur Chest: + wheeze/ rales/ dullness; no accessory muscle use, no nasal flaring, no sternal retractions Abd.: Soft Non-tender Ext: No clubbing cyanosis, 1+ edema Neuro:  normal strength Skin: No rashes, warm and dry Psych: normal mood and behavior   Assessment/Plan  Dyspnea Dyspnea/ Chest tightness Plan: We will do FeNo test today in the office. This checks for airway inflammation. ( Your score was 23) Take Delsym every 12 hours for cough We will prescribe you a Ventolin Inhaler. Use this for shortness of breath or wheezing up to every 6 hours. Rest and hydration. Mucinex 600 mg twice daily. Take a full glass of water. We will schedule a HRCT ( [address]) We will schedule you for PFT's once bronchitis is better. Follow up in 3 weeks. Please contact office for sooner follow up if symptoms do not improve or worsen or seek emergency care        Magdalen Spatz, NP 01/11/2016  11:04 AM

## 2016-01-11 NOTE — Patient Instructions (Addendum)
It is nice to meet you today. We will do FeNo test today in the office. This checks for airway inflammation. ( Your score was 23) Take Delsym every 12 hours for cough We will prescribe you a Ventolin Inhaler. Use this for shortness of breath or wheezing up to every 6 hours. Rest and hydration. Mucinex 600 mg twice daily. Take a full glass of water. We will schedule a HRCT ( [address] We will schedule you for PFT's once bronchitis is better. Follow up in 3 weeks. Please contact office for sooner follow up if symptoms do not improve or worsen or seek emergency care

## 2016-01-11 NOTE — Assessment & Plan Note (Signed)
Dyspnea/ Chest tightness Plan: We will do FeNo test today in the office. This checks for airway inflammation. ( Your score was 23) Take Delsym every 12 hours for cough We will prescribe you a Ventolin Inhaler. Use this for shortness of breath or wheezing up to every 6 hours. Rest and hydration. Mucinex 600 mg twice daily. Take a full glass of water. We will schedule a HRCT ( [address] We will schedule you for PFT's once bronchitis is better. Follow up in 3 weeks. Please contact office for sooner follow up if symptoms do not improve or worsen or seek emergency care

## 2016-01-18 ENCOUNTER — Ambulatory Visit (INDEPENDENT_AMBULATORY_CARE_PROVIDER_SITE_OTHER)
Admission: RE | Admit: 2016-01-18 | Discharge: 2016-01-18 | Disposition: A | Discharge status: Home / Self Care | Payer: Medicare Other | Source: Ambulatory Visit | Attending: Acute Care | Admitting: Acute Care

## 2016-01-18 DIAGNOSIS — R0602 Shortness of breath: Secondary | ICD-10-CM

## 2016-01-18 DIAGNOSIS — J479 Bronchiectasis, uncomplicated: Secondary | ICD-10-CM | POA: Diagnosis not present

## 2016-01-26 DIAGNOSIS — M7732 Calcaneal spur, left foot: Secondary | ICD-10-CM | POA: Diagnosis not present

## 2016-01-26 DIAGNOSIS — L89621 Pressure ulcer of left heel, stage 1: Secondary | ICD-10-CM | POA: Diagnosis not present

## 2016-01-26 DIAGNOSIS — M79672 Pain in left foot: Secondary | ICD-10-CM | POA: Diagnosis not present

## 2016-02-01 ENCOUNTER — Ambulatory Visit (INDEPENDENT_AMBULATORY_CARE_PROVIDER_SITE_OTHER): Payer: Medicare Other | Admitting: Acute Care

## 2016-02-01 ENCOUNTER — Encounter: Payer: Self-pay | Admitting: Acute Care

## 2016-02-01 ENCOUNTER — Ambulatory Visit (HOSPITAL_COMMUNITY): Payer: Medicare Other

## 2016-02-01 VITALS — BP 112/68 | HR 59 | Ht 67.0 in | Wt 137.0 lb

## 2016-02-01 DIAGNOSIS — R0600 Dyspnea, unspecified: Secondary | ICD-10-CM

## 2016-02-01 DIAGNOSIS — J479 Bronchiectasis, uncomplicated: Secondary | ICD-10-CM | POA: Diagnosis not present

## 2016-02-01 NOTE — Progress Notes (Signed)
History of Present Illness Sandra Ellis is a 70 y.o. female fpreviously followed by Dr. 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 PPD negative, minimal smoking history ( Quit 1975). She is from [place] which is an endemic area for histoplasmosis.   02/01/2016 Follow Up Appointment: Pt. presents to the office today for follow-up of  cough. She states that she is 100% better, and that her cough is gone. She denies fever, chest pain, orthopnea, hemoptysis, leg or calf pain. She states that she is currently at her baseline health status, with no complaints. She did cancel her pulmonary function tests, and does not plan to reschedule them as she feels she is so much better that they are not necessary.  Tests 12/28/2015: CXR  Calcified granulomas again noted in the right lung, unchanged compared to the prior study. Lung volumes are normal. No consolidative airspace disease. No pleural effusions. No pneumothorax. No pulmonary nodule or mass noted. Pulmonary vasculature and the cardiomediastinal silhouette are within normal limits.  IMPRESSION: No radiographic evidence of acute cardiopulmonary disease.  Past medical hx Past Medical History  Diagnosis Date  . H/O blood clots 1990  . Elevated cholesterol      Past surgical hx, Family hx, Social hx all reviewed.  Current Outpatient Prescriptions on File Prior to Visit  Medication Sig  . albuterol (PROVENTIL HFA;VENTOLIN HFA) 108 (90 Base) MCG/ACT inhaler Inhale 2 puffs into the lungs every 4 (four) hours as needed for wheezing or shortness of breath.  . Ascorbic Acid (VITAMIN C PO) Take 1 tablet by mouth daily.  Marland Kitchen aspirin 81 MG tablet Take 162 mg by mouth daily.   Marland Kitchen atenolol (TENORMIN) 25 MG tablet Take 6.25 mg by mouth daily.   . hydrochlorothiazide (HYDRODIURIL) 25 MG tablet Take 25 mg by mouth daily.  Marland Kitchen levothyroxine (SYNTHROID, LEVOTHROID) 75 MCG tablet Take 75 mcg by mouth daily.  Marland Kitchen  NIACIN CR PO Take 1 tablet by mouth daily.   . Omega 3 1000 MG CAPS Take 4 capsules by mouth daily.  Marland Kitchen OVER THE COUNTER MEDICATION Take 4 each by mouth daily. Juicy juice multivitamin - chewable 2 vegetable and 2 fruit.  Marland Kitchen POTASSIUM PO Take 1 tablet by mouth daily.   . Red Yeast Rice Extract (RED YEAST RICE PO) Take 1 tablet by mouth daily.   Marland Kitchen triamcinolone cream (KENALOG) 0.1 % Apply 1 application topically 4 (four) times daily as needed. rash  . acetaminophen (TYLENOL) 500 MG tablet Take 500 mg by mouth daily as needed for moderate pain. Reported on 02/01/2016   No current facility-administered medications on file prior to visit.     Allergies  Allergen Reactions  . Levaquin [Levofloxacin In D5w]     NUMBNESS    Review Of Systems:  Constitutional:   No  weight loss, night sweats,  Fevers, chills, fatigue, or  lassitude.  HEENT:   No headaches,  Difficulty swallowing,  Tooth/dental problems, or  Sore throat,                No sneezing, itching, ear ache, nasal congestion, post nasal drip,   CV:  No chest pain,  Orthopnea, PND, swelling in lower extremities, anasarca, dizziness, palpitations, syncope.   GI  No heartburn, indigestion, abdominal pain, nausea, vomiting, diarrhea, change in bowel habits, loss of appetite, bloody stools.   Resp: No shortness of breath with exertion or at rest.  No excess mucus, no productive cough,  No non-productive cough,  No coughing up of blood.  No change in color of mucus.  No wheezing.  No chest wall deformity  Skin: no rash or lesions.  GU: no dysuria, change in color of urine, no urgency or frequency.  No flank pain, no hematuria   MS:  No joint pain or swelling.  No decreased range of motion.  No back pain.  Psych:  No change in mood or affect. No depression or anxiety.  No memory loss.   Vital Signs BP 112/68 mmHg  Pulse 59  Ht 5\' 7"  (1.702 m)  Wt 137 lb (62.143 kg)  BMI 21.45 kg/m2  SpO2 97%   Physical Exam:  General- No  distress,  A&Ox3, very pleasant ENT: No sinus tenderness, TM clear, pale nasal mucosa, no oral exudate,no post nasal drip, no LAN Cardiac: S1, S2, regular rate and rhythm, no murmur Chest: No wheeze/ rales/ dullness; no accessory muscle use, no nasal flaring, no sternal retractions Abd.: Soft Non-tender Ext: No clubbing cyanosis, edema Neuro:  normal strength Skin: No rashes, warm and dry Psych: normal mood and behavior   Assessment/Plan  Dyspnea Resolved dyspnea and fatigue CT Chest indicated No evidence of interstitial lung disease. There are scattered areas of very mild cylindrical bronchiectasis throughout the lungs bilaterally.  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 Lamonte Sakai in  1 year Call proactively if you feel you are getting a respiratory virus so we can prevent it from progressing. Please contact office for sooner follow up if symptoms do not improve or worsen or seek emergency care       Magdalen Spatz, NP 02/01/2016  1:18 PM

## 2016-02-01 NOTE — Patient Instructions (Signed)
It is great to see you today I am glad you are so much better. Follow up in 1 year Please contact office for sooner follow up if symptoms do not improve or worsen or seek emergency care

## 2016-02-01 NOTE — Assessment & Plan Note (Addendum)
Resolved dyspnea and fatigue CT Chest indicated No evidence of interstitial lung disease. There are scattered areas of very mild cylindrical bronchiectasis throughout the lungs bilaterally.  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 Lamonte Sakai in  1 year Call proactively if you feel you are getting a respiratory virus so we can prevent it from progressing. Please contact office for sooner follow up if symptoms do not improve or worsen or seek emergency care

## 2016-02-14 ENCOUNTER — Telehealth: Payer: Self-pay | Admitting: Emergency Medicine

## 2016-02-14 DIAGNOSIS — R0600 Dyspnea, unspecified: Secondary | ICD-10-CM

## 2016-02-14 NOTE — Telephone Encounter (Signed)
Spoke with pt's spouse, aware of recs.  Pt scheduled tomorrow morning with AD and aware to get a cxr prior.  This has been ordered.  Nothing further needed.

## 2016-02-14 NOTE — Telephone Encounter (Signed)
Spoke with pt, advised that we address all sick messages by the day's end but if she feels like she is worsening and cannot wait for recs that she should go to ED.  Pt expressed understanding.  RB please advise.  thanks

## 2016-02-14 NOTE — Telephone Encounter (Signed)
I've never met her, but I believe she needs a CXR, to be seen in office to evaluate for possible PNA. If she declines in any way then she needs to go to ED

## 2016-02-14 NOTE — Telephone Encounter (Signed)
Called spoke with patient, apologized for delay Pt saw SG on 6.1.17 she is not in the office this afternoon Pt has not yet seen RB in the office - former [facility] pt  Dr B please advise, thank you. Paged at 2:33pm

## 2016-02-14 NOTE — Telephone Encounter (Signed)
Pt calling stating that she now has burning in her chest thinks she may have pneumonia please advise she can be reached @ [phone].Hillery Hunter

## 2016-02-14 NOTE — Telephone Encounter (Signed)
Pt c/o productive cough with yellow mucus. Slight fever, states that her normal is 96.2 and she has been running about 98.0 Pt states it started with a head cold 02/11/16 and then she started coughing on 02/12/16. Denies CP/tightness, SOB and wheezing.  Denies using OTC meds.  Requests something be called in. Please advise Dr Lamonte Sakai, pt advised to follow up with you in last OV notes per SG. Thanks.

## 2016-02-14 NOTE — Telephone Encounter (Signed)
Patient calling back again because she has not heard anything back yet

## 2016-02-15 ENCOUNTER — Encounter: Payer: Self-pay | Admitting: Pulmonary Disease

## 2016-02-15 ENCOUNTER — Ambulatory Visit (INDEPENDENT_AMBULATORY_CARE_PROVIDER_SITE_OTHER): Payer: Medicare Other | Admitting: Pulmonary Disease

## 2016-02-15 ENCOUNTER — Ambulatory Visit (INDEPENDENT_AMBULATORY_CARE_PROVIDER_SITE_OTHER)
Admission: RE | Admit: 2016-02-15 | Discharge: 2016-02-15 | Disposition: A | Discharge status: Home / Self Care | Payer: Medicare Other | Source: Ambulatory Visit | Attending: Emergency Medicine | Admitting: Emergency Medicine

## 2016-02-15 VITALS — BP 122/72 | HR 65 | Temp 98.6°F | Ht 67.0 in | Wt 138.0 lb

## 2016-02-15 DIAGNOSIS — R05 Cough: Secondary | ICD-10-CM | POA: Diagnosis not present

## 2016-02-15 DIAGNOSIS — J209 Acute bronchitis, unspecified: Secondary | ICD-10-CM | POA: Insufficient documentation

## 2016-02-15 DIAGNOSIS — R0600 Dyspnea, unspecified: Secondary | ICD-10-CM | POA: Diagnosis not present

## 2016-02-15 DIAGNOSIS — J479 Bronchiectasis, uncomplicated: Secondary | ICD-10-CM | POA: Diagnosis not present

## 2016-02-15 DIAGNOSIS — R059 Cough, unspecified: Secondary | ICD-10-CM | POA: Insufficient documentation

## 2016-02-15 DIAGNOSIS — J40 Bronchitis, not specified as acute or chronic: Secondary | ICD-10-CM | POA: Diagnosis not present

## 2016-02-15 MED ORDER — CEFDINIR 300 MG PO CAPS: 300.0000 mg | ORAL_CAPSULE | Freq: Two times a day (BID) | ORAL | Status: DC

## 2016-02-15 NOTE — Assessment & Plan Note (Signed)
Recent cough related to bronchitis. Some sinus congestion now. May need flonase, zyrtec if not better. Denies GERD sx.

## 2016-02-15 NOTE — Assessment & Plan Note (Signed)
Recurrent bronchitis since march 2017.  Woul improve with abx but with recurrence.  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 Has been on Z-Pak, ciprofloxacin recently. Denies any MRSA or gram-negative infection in the past. Chest x-ray today: Personally reviewed. No definite infiltrate seen. Plan: 1. Cefdinir 300 mg BID x 7d. 2. Try decongestant.  3. Patient to call if not better. At that point, she may need a repeat chest CT scan to rule out occult infection. She had a chest CT scan in May 2017 which showed bronchiectasis but no definite infiltrate seen then. May also need workup for recurrent infection : Ig levels, HIV, cbc, cmp.  4. We will try to get a sputum sample today.

## 2016-02-15 NOTE — Patient Instructions (Signed)
It was a pleasure taking care of you today!  You are diagnosed with bronchitis.  We will obtain  sputum sample.   Make sure you take your antibiotics: Cefdinir  300 mg/tab, 1 tab 2x/day.  We discussed potential side effects/adverse reactions of antibiotics, including, but not limited to: rash, diarrhea.  Please call the office if you are having adverse reaction to meds/antibiotics.  Please call the office your symptoms are getting worse despite the meds/antibiotics.   Return to clinic in 6 weeks with Dr. Corrie Dandy or NP.

## 2016-02-15 NOTE — Assessment & Plan Note (Signed)
Chest ct scan (01/2016) bronchiectasis. Calcified granulomas in the right upper and middle lobes. May need flutter valve.

## 2016-02-15 NOTE — Progress Notes (Signed)
Subjective:    Patient ID: Sandra Ellis, female    DOB: 1946-03-03, 70 y.o.   MRN: [id number]  HPI Sandra Ellis is a 70 y.o. female fpreviously followed by Dr. 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 PPD negative, minimal smoking history ( Quit 1975). 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 She was okay during the trip. When she got back, she had a bout of bronchitis for which she ended up being on Z-Pak. She also got UTI for which she got ciprofloxacin. She then developed maculopapular erythematous rash at her back. It was biopsied and the diagnosis allegedly was shingles. Not sure if it's a drug reaction since she is also allergic to Levaquin. Rash got better. She had another bronchitis about after that for which she ended up being on Z-Pak again.  Pt was seen by SG on 02/01/16 as f/u on cough. That time, her cough is better and she said she was at baseline.   Since seeing SG, she was doing okay until 3 days ago. Started having cough, congestion, fevers. 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 No recent other sick contacts. No recent travel. No pets. Her house is clean. No carpets. No stagnant water.  CXR this am : no definite infiltarte seen.    Review of Systems  Constitutional: Positive for fever and fatigue.  HENT: Positive for congestion.   Eyes: Negative.   Respiratory: Positive for cough, shortness of breath and wheezing.   Cardiovascular: Negative.   Gastrointestinal: Negative.   Endocrine: Negative.   Genitourinary: Negative.   Musculoskeletal: Negative.   Skin: Negative.   Allergic/Immunologic: Negative.   Neurological: Negative.   Hematological: Negative.   Psychiatric/Behavioral: Negative.   All other systems reviewed and are negative.      Objective:   Physical Exam  Vitals:  Filed Vitals:   02/15/16 1024  BP: 122/72  Pulse: 65  Temp: 98.6 F (37 C)  TempSrc: Oral  Height: 5\' 7"  (1.702 m)  Weight: 138 lb (62.596 kg)  SpO2: 95%    Constitutional/General:  Pleasant, well-nourished, well-developed, not in any distress,  Comfortably seating.  Well kempt. Coughing.   Body mass index is 21.61 kg/(m^2). Wt Readings from Last 3 Encounters:  02/15/16 138 lb (62.596 kg)  02/01/16 137 lb (62.143 kg)  01/11/16 136 lb (61.689 kg)     HEENT: Pupils equal and reactive to light and accommodation. Anicteric sclerae. Normal nasal mucosa.   No oral  lesions,  mouth clear,  oropharynx clear, no postnasal drip. (-) Oral thrush. No dental caries.  Airway - Mallampati class III  Neck: No masses. Midline trachea. No JVD, (-) LAD. (-) bruits appreciated.  Respiratory/Chest: Grossly normal chest. (-) deformity. (-) Accessory muscle use.  Symmetric expansion. (-) Tenderness on palpation.  Resonant on percussion.  Diminished BS on both lower lung zones. (-) wheezing, crackles, rhonchi (-) egophony  Cardiovascular: Regular rate and  rhythm, heart sounds normal, no murmur or gallops, no peripheral edema  Gastrointestinal:  Normal bowel sounds. Soft, non-tender. No hepatosplenomegaly.  (-) masses.   Musculoskeletal:  Normal muscle tone. Normal gait.   Extremities: Grossly normal. (-) clubbing, cyanosis.  (-) edema  Skin: (-) rash,lesions seen.   Neurological/Psychiatric : alert, oriented to time, place, person. Normal mood and affect  Assessment & Plan:  Acute bronchitis Recurrent bronchitis since march 2017.  Woul improve with abx but with recurrence.  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 Has been on Z-Pak, ciprofloxacin recently. Denies any MRSA or gram-negative infection in the past. Chest x-ray today: Personally reviewed. No definite infiltrate seen. Plan: 1. Cefdinir 300 mg BID x 7d. 2. Try decongestant.  3. Patient to call if  not better. At that point, she may need a repeat chest CT scan to rule out occult infection. She had a chest CT scan in May 2017 which showed bronchiectasis but no definite infiltrate seen then. May also need workup for recurrent infection : Ig levels, HIV, cbc, cmp.  4. We will try to get a sputum sample today.  Bronchiectasis ([place]) Chest ct scan (01/2016) bronchiectasis. Calcified granulomas in the right upper and middle lobes. May need flutter valve.  Cough Recent cough related to bronchitis. Some sinus congestion now. May need flonase, zyrtec if not better. Denies GERD sx.    I personally reviewed previous images (Chest Xray, Chest Ct scan) done on this patient. I reviewed the reports on the images as well.     Return to clinic in 6 weeks     J. Shirl Harris, MD 02/15/2016, 11:31 AM [facility] Pager [phone] After 3 pm or if no answer, call [phone]

## 2016-02-16 ENCOUNTER — Other Ambulatory Visit: Payer: Medicare Other

## 2016-02-16 DIAGNOSIS — J40 Bronchitis, not specified as acute or chronic: Secondary | ICD-10-CM | POA: Diagnosis not present

## 2016-02-17 ENCOUNTER — Encounter (HOSPITAL_COMMUNITY): Payer: Self-pay | Admitting: Emergency Medicine

## 2016-02-17 ENCOUNTER — Emergency Department (HOSPITAL_COMMUNITY)
Admission: EM | Admit: 2016-02-17 | Discharge: 2016-02-18 | Disposition: A | Discharge status: Home / Self Care | Payer: Medicare Other | Attending: Emergency Medicine | Admitting: Emergency Medicine

## 2016-02-17 DIAGNOSIS — Z87891 Personal history of nicotine dependence: Secondary | ICD-10-CM | POA: Insufficient documentation

## 2016-02-17 DIAGNOSIS — R05 Cough: Secondary | ICD-10-CM | POA: Diagnosis present

## 2016-02-17 DIAGNOSIS — J4530 Mild persistent asthma, uncomplicated: Secondary | ICD-10-CM | POA: Diagnosis not present

## 2016-02-17 NOTE — ED Provider Notes (Signed)
CSN: [id number]     Arrival date & time 02/17/16  2218 History  By signing my name below, I, Hansel Feinstein, attest that this documentation has been prepared under the direction and in the presence of  Margarita Mail, PA-C. Electronically Signed: Hansel Feinstein, ED Scribe. 02/17/2016. 12:20 AM.    Chief Complaint  Patient presents with  . Cough   The history is provided by the patient. No language interpreter was used.   HPI Comments: Sandra Ellis is a 69 y.o. female who presents to the Emergency Department complaining of persistent, occasionally productive cough onset last week. Pt states that she experiences exacerbated periods of coughing, lasting up to an hour at a time. Per pt, her cough is worse at night. Pt was seen by her pulmonologist 2 days ago for the same complaints, dx with acute bronchitis and given rx for Cefdinir 300 mg BID x 7d. CXR from pulmonology showed calcified granules consistent with prior granulomatous disease. She states she then went to UC this morning and received an IM medication, but does not remember the name of the medication. Pt states that she has seen no relief of her symptoms with Cefdinir and reports that she discontinued this medication today after being seen at [facility]. Pt also notes she has been taking Mucinex and using an albuterol inhaler with minimal relief of symptoms. Per pt, she is using a cough suppressant containing codeine that has provided mild to moderate relief of coughing. On chart review, she had bronchitis in 11/2015 after returning from the [place] which was treated with a Z-pak, and saw resolution of her symptoms for 3 weeks. Per pt, she developed a rash on her back a week later, which was biopsied negative for shingles, and treated again with a Z-pak. Pt has not lived in desert regions and has no h/o bird or ferret raising. Per chart review, she is from [place] which is an endemic area for histoplasmosis. Pt notes that she frequently had recurrent  URI symptoms while living in [place]; however, this resolved after moving to [place]. Per pt, she received a TB test today. 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 She denies fever, nausea, emesis, abdominal pain. Per chart review, pt has a minimal smoking history and quit in 1975.   Past Medical History  Diagnosis Date  . H/O blood clots 1990  . Elevated cholesterol    Past Surgical History  Procedure Laterality Date  . Vaginal hysterectomy      TVH  . Tonisllectomy    . Knee surgery      cyst removed   Family History  Problem Relation Age of Onset  . Diabetes Mother   . Asthma Mother    Social History  Substance Use Topics  . Smoking status: Former Smoker -- 0.50 packs/day for 8 years    Types: Cigarettes    Quit date: 09/02/1973  . Smokeless tobacco: None  . Alcohol Use: No   OB History    Gravida Para Term Preterm AB TAB SAB Ectopic Multiple Living   3 3        3      Review of Systems  Constitutional: Negative for fever.  Respiratory: Positive for cough.   Gastrointestinal: Negative for nausea, vomiting and abdominal pain.   Allergies  Levaquin  Home Medications   Prior to Admission medications   Medication Sig Start Date End Date Taking? Authorizing Provider  acetaminophen (TYLENOL) 500 MG tablet Take 500 mg by mouth daily as needed for moderate  pain. Reported on 02/01/2016    Historical Provider, MD  albuterol (PROVENTIL HFA;VENTOLIN HFA) 108 (90 Base) MCG/ACT inhaler Inhale 2 puffs into the lungs every 4 (four) hours as needed for wheezing or shortness of breath. 01/11/16   Magdalen Spatz, NP  Ascorbic Acid (VITAMIN C PO) Take 1 tablet by mouth daily.    Historical Provider, MD  aspirin 81 MG tablet Take 162 mg by mouth daily.     Historical Provider, MD  atenolol (TENORMIN) 25 MG tablet Take 6.25 mg by mouth daily.     Historical Provider, MD  cefdinir (OMNICEF) 300 MG capsule Take 1 capsule (300 mg total) by mouth 2 (two) times daily. 02/15/16   Hoffman, MD   hydrochlorothiazide (HYDRODIURIL) 25 MG tablet Take 25 mg by mouth daily.    Historical Provider, MD  levothyroxine (SYNTHROID, LEVOTHROID) 75 MCG tablet Take 75 mcg by mouth daily.    Historical Provider, MD  NIACIN CR PO Take 1 tablet by mouth daily.     Historical Provider, MD  Omega 3 1000 MG CAPS Take 4 capsules by mouth daily.    Historical Provider, MD  OVER THE COUNTER MEDICATION Take 4 each by mouth daily. Juicy juice multivitamin - chewable 2 vegetable and 2 fruit.    Historical Provider, MD  POTASSIUM PO Take 1 tablet by mouth daily.     Historical Provider, MD  Red Yeast Rice Extract (RED YEAST RICE PO) Take 1 tablet by mouth daily.     Historical Provider, MD  triamcinolone cream (KENALOG) 0.1 % Apply 1 application topically 4 (four) times daily as needed. rash 12/19/15   Historical Provider, MD  vitamin C (ASCORBIC ACID) 500 MG tablet Take 500 mg by mouth daily.    Historical Provider, MD   BP 127/65 mmHg  Pulse 55  Temp(Src) 97.9 F (36.6 C) (Oral)  Resp 20  Ht 5\' 7"  (1.702 m)  Wt 134 lb (60.782 kg)  BMI 20.98 kg/m2  SpO2 99% Physical Exam  Constitutional: She appears well-developed and well-nourished.  HENT:  Head: Normocephalic.  Eyes: Conjunctivae are normal.  Cardiovascular: Normal rate, regular rhythm and normal heart sounds.   No murmur heard. Pulmonary/Chest: Effort normal. No respiratory distress.  Tight wheezy paroxysms of cough.   Abdominal: She exhibits no distension.  Musculoskeletal: Normal range of motion.  Neurological: She is alert.  Skin: Skin is warm and dry.  Psychiatric: She has a normal mood and affect. Her behavior is normal.  Nursing note and vitals reviewed.   ED Course  Procedures (including critical care time) DIAGNOSTIC STUDIES: Oxygen Saturation is 99% on RA, normal by my interpretation.    COORDINATION OF CARE: 12:08 AM Discussed treatment plan with pt at bedside which includes breathing tx, prednisone and pt agreed to plan.    Labs Review Labs Reviewed - No data to display  Imaging Review No results found. I have personally reviewed and evaluated these images and lab results as part of my medical decision-making.   EKG Interpretation None      MDM   Final diagnoses:  RAD (reactive airway disease), mild persistent, uncomplicated    Patient with tight, persistent wheezing cough. Unable to sleep well due to persistent cough.  Will treat with cough suppressant, inhaled steroid, and prednisone. Patient improved after duoneb tx. The patient appears reasonably screened and/or stabilized for discharge and I doubt any other medical condition or other [facility] requiring further screening, evaluation, or treatment in the ED  at this time prior to discharge.    I personally performed the services described in this documentation, which was scribed in my presence. The recorded information has been reviewed and is accurate.      Margarita Mail, PA-C 02/18/16 0134  Harvel Quale, MD 02/21/16 [phone]

## 2016-02-17 NOTE — ED Notes (Signed)
Patient complaining of excessive coughing. Patient has been moving. Patient was diagnosed with bronchitis on Thursday and was given medication. Patient states the medication is not working and she feels worse.

## 2016-02-18 DIAGNOSIS — J4530 Mild persistent asthma, uncomplicated: Secondary | ICD-10-CM | POA: Diagnosis not present

## 2016-02-18 MED ORDER — AEROCHAMBER PLUS W/MASK MISC
1.0000 | Freq: Once | Status: AC
  Administered 2016-02-18: 1
  Filled 2016-02-18: qty 1

## 2016-02-18 MED ORDER — IPRATROPIUM-ALBUTEROL 0.5-2.5 (3) MG/3ML IN SOLN
3.0000 mL | Freq: Once | RESPIRATORY_TRACT | Status: AC
  Administered 2016-02-18: 3 mL via RESPIRATORY_TRACT
  Filled 2016-02-18: qty 3

## 2016-02-18 MED ORDER — BECLOMETHASONE DIPROPIONATE 40 MCG/ACT IN AERS: 1.0000 | INHALATION_SPRAY | Freq: Two times a day (BID) | RESPIRATORY_TRACT | Status: DC

## 2016-02-18 MED ORDER — PREDNISONE 10 MG (21) PO TBPK: 10.0000 mg | ORAL_TABLET | Freq: Every day | ORAL | Status: DC

## 2016-02-18 MED ORDER — DEXTROMETHORPHAN-GUAIFENESIN 15-400 MG PO TABS: 1.0000 | ORAL_TABLET | ORAL | Status: DC | PRN

## 2016-02-18 NOTE — Discharge Instructions (Signed)
Bronchospasm, Adult  A bronchospasm is a spasm or tightening of the airways going into the lungs. During a bronchospasm breathing becomes more difficult because the airways get smaller. When this happens there can be coughing, a whistling sound when breathing (wheezing), and difficulty breathing. Bronchospasm is often associated with asthma, but not all patients who experience a bronchospasm have asthma.  CAUSES   A bronchospasm is caused by inflammation or irritation of the airways. The inflammation or irritation may be triggered by:   · Allergies (such as to animals, pollen, food, or mold). Allergens that cause bronchospasm may cause wheezing immediately after exposure or many hours later.    · Infection. Viral infections are believed to be the most common cause of bronchospasm.    · Exercise.    · Irritants (such as pollution, cigarette smoke, strong odors, aerosol sprays, and paint fumes).    · Weather changes. Winds increase molds and pollens in the air. Rain refreshes the air by washing irritants out. Cold air may cause inflammation.    · Stress and emotional upset.    SIGNS AND SYMPTOMS   · Wheezing.    · Excessive nighttime coughing.    · Frequent or severe coughing with a simple cold.    · Chest tightness.    · Shortness of breath.    DIAGNOSIS   Bronchospasm is usually diagnosed through a history and physical exam. Tests, such as chest X-rays, are sometimes done to look for other conditions.  TREATMENT   · Inhaled medicines can be given to open up your airways and help you breathe. The medicines can be given using either an inhaler or a nebulizer machine.  · Corticosteroid medicines may be given for severe bronchospasm, usually when it is associated with asthma.  HOME CARE INSTRUCTIONS   · Always have a plan prepared for seeking medical care. Know when to call your health care provider and local emergency services (911 in the [place] Know where you can access local emergency care.  · Only take medicines as  directed by your health care provider.  · If you were prescribed an inhaler or nebulizer machine, ask your health care provider to explain how to use it correctly. Always use a spacer with your inhaler if you were given one.  · It is necessary to remain calm during an attack. Try to relax and breathe more slowly.   · Control your home environment in the following ways:      Change your heating and air conditioning filter at least once a month.      Limit your use of fireplaces and wood stoves.    Do not smoke and do not allow smoking in your home.      Avoid exposure to perfumes and fragrances.      Get rid of pests (such as roaches and mice) and their droppings.      Throw away plants if you see mold on them.      Keep your house clean and dust free.      Replace carpet with wood, tile, or vinyl flooring. Carpet can trap dander and dust.      Use allergy-proof pillows, mattress covers, and box spring covers.      Wash bed sheets and blankets every week in hot water and dry them in a dryer.      Use blankets that are made of polyester or cotton.      Wash hands frequently.  SEEK MEDICAL CARE IF:   · You have muscle aches.    · You have chest pain.    · The sputum changes from clear or   white to yellow, green, gray, or bloody.    · The sputum you cough up gets thicker.    · There are problems that may be related to the medicine you are given, such as a rash, itching, swelling, or trouble breathing.    SEEK IMMEDIATE MEDICAL CARE IF:   · You have worsening wheezing and coughing even after taking your prescribed medicines.    · You have increased difficulty breathing.    · You develop severe chest pain.  MAKE SURE YOU:   · Understand these instructions.  · Will watch your condition.  · Will get help right away if you are not doing well or get worse.     This information is not intended to replace advice given to you by your health care provider. Make sure you discuss any questions you have with your health care  provider.     Document Released: 08/22/2003 Document Revised: 09/09/2014 Document Reviewed: 02/08/2013  Elsevier Interactive Patient Education ©2016 [employer]

## 2016-02-19 LAB — RESPIRATORY CULTURE OR RESPIRATORY AND SPUTUM CULTURE: Organism ID, Bacteria: NORMAL

## 2016-02-22 ENCOUNTER — Other Ambulatory Visit (INDEPENDENT_AMBULATORY_CARE_PROVIDER_SITE_OTHER): Payer: Medicare Other

## 2016-02-22 ENCOUNTER — Encounter: Payer: Self-pay | Admitting: Adult Health

## 2016-02-22 ENCOUNTER — Ambulatory Visit (INDEPENDENT_AMBULATORY_CARE_PROVIDER_SITE_OTHER): Payer: Medicare Other | Admitting: Adult Health

## 2016-02-22 VITALS — BP 120/74 | HR 55 | Temp 97.5°F | Ht 67.0 in | Wt 139.0 lb

## 2016-02-22 DIAGNOSIS — J209 Acute bronchitis, unspecified: Secondary | ICD-10-CM

## 2016-02-22 LAB — CBC WITH DIFFERENTIAL/PLATELET
Basophils Absolute: 0.1 10*3/uL (ref 0.0–0.1)
Basophils Relative: 0.6 % (ref 0.0–3.0)
Eosinophils Absolute: 0.2 10*3/uL (ref 0.0–0.7)
Eosinophils Relative: 1.4 % (ref 0.0–5.0)
HCT: 42.6 % (ref 36.0–46.0)
Hemoglobin: 14.4 g/dL (ref 12.0–15.0)
Lymphocytes Relative: 35.1 % (ref 12.0–46.0)
Lymphs Abs: 5.6 10*3/uL — ABNORMAL HIGH (ref 0.7–4.0)
MCHC: 33.7 g/dL (ref 30.0–36.0)
MCV: 92.7 fl (ref 78.0–100.0)
Monocytes Absolute: 1 10*3/uL (ref 0.1–1.0)
Monocytes Relative: 6.3 % (ref 3.0–12.0)
Neutro Abs: 9.1 10*3/uL — ABNORMAL HIGH (ref 1.4–7.7)
Neutrophils Relative %: 56.6 % (ref 43.0–77.0)
Platelets: 382 10*3/uL (ref 150.0–400.0)
RBC: 4.6 Mil/uL (ref 3.87–5.11)
RDW: 14.2 % (ref 11.5–15.5)
WBC: 16.1 10*3/uL — ABNORMAL HIGH (ref 4.0–10.5)

## 2016-02-22 LAB — HEPATIC FUNCTION PANEL
ALT: 34 U/L (ref 0–35)
AST: 23 U/L (ref 0–37)
Albumin: 4.3 g/dL (ref 3.5–5.2)
Alkaline Phosphatase: 55 U/L (ref 39–117)
Bilirubin, Direct: 0 mg/dL (ref 0.0–0.3)
Total Bilirubin: 0.4 mg/dL (ref 0.2–1.2)
Total Protein: 7 g/dL (ref 6.0–8.3)

## 2016-02-22 LAB — BASIC METABOLIC PANEL
BUN: 9 mg/dL (ref 6–23)
CO2: 34 mEq/L — ABNORMAL HIGH (ref 19–32)
Calcium: 9.8 mg/dL (ref 8.4–10.5)
Chloride: 91 mEq/L — ABNORMAL LOW (ref 96–112)
Creatinine, Ser: 0.81 mg/dL (ref 0.40–1.20)
GFR: 74.71 mL/min (ref >60.00)
Glucose, Bld: 100 mg/dL — ABNORMAL HIGH (ref 70–99)
Potassium: 3.8 mEq/L (ref 3.5–5.1)
Sodium: 131 mEq/L — ABNORMAL LOW (ref 135–145)

## 2016-02-22 LAB — SEDIMENTATION RATE: Sed Rate: 10 mm/hr (ref 0–30)

## 2016-02-22 MED ORDER — HYDROCODONE-HOMATROPINE 5-1.5 MG/5ML PO SYRP: 5.0000 mL | ORAL_SOLUTION | Freq: Four times a day (QID) | ORAL | Status: DC | PRN

## 2016-02-22 NOTE — Addendum Note (Signed)
Addended by: Osa Craver on: 02/22/2016 03:03 PM   Modules accepted: Orders

## 2016-02-22 NOTE — Patient Instructions (Addendum)
Finish Prednisone taper as directed.  Delsym 2 tsp Twice daily  For cough .  Hydromet 1 tsp every 6hr As needed  Cough , may make you sleepy.  Zyrtec 10mg  At bedtime   Labs today  mucinex Twice daily  For congestion/cough.  Use sips of water to soothe throat ,  Do not clear thorat . NO mints .  follow up as planned in 1 month and As needed   Please contact office for sooner follow up if symptoms do not improve or worsen or seek emergency care

## 2016-02-22 NOTE — Progress Notes (Signed)
Subjective:    Patient ID: [place], female    DOB: 03-Oct-1945, 70 y.o.   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 From [place] endemic [place] for histoplasmosis .    02/22/2016 Acute OV  Returns for persistent cough . Seen 6 weeks ago for acute bronchitis , tx w/ abx and cough suppressants . She got better for 2 weeks then cough came back . Seen in office on 6/15 with recurrent bronchitis . She was tx w/ Omnicef. Did not feel any better.  Went to Urgent Care and ER .[place] from [place] .  Started on Steroids on day 2. Finished Zpack .  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 Married .  TDAP is utd. PPD recently neg.  Did have a rash on back , seen by dermatology x 2 . Self limited and resolved. Path did not identified.  Rash and cough developed after trip to [place]. 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  Denies chest pain, orthopnea, edema or fever.    Past Medical History  Diagnosis Date  . H/O blood clots 1990  . Elevated cholesterol    Current Outpatient Prescriptions on File Prior to Visit  Medication Sig Dispense Refill  . acetaminophen (TYLENOL) 500 MG tablet Take 500 mg by mouth daily as needed for moderate pain. Reported on 02/01/2016    . albuterol (PROVENTIL HFA;VENTOLIN HFA) 108 (90 Base) MCG/ACT inhaler Inhale 2 puffs into the lungs every 4 (four) hours as needed for wheezing or shortness of breath. 1 Inhaler 5  . Ascorbic Acid (VITAMIN C PO) Take 1 tablet by mouth daily.    Marland Kitchen aspirin 81 MG tablet Take 162 mg by mouth daily.     Marland Kitchen atenolol (TENORMIN) 25 MG tablet Take 6.25 mg by mouth daily.     Marland Kitchen Dextromethorphan-Guaifenesin 15-400 MG TABS Take 1 tablet by mouth every 4 (four) hours as needed (COUGH). 84 each 0  . hydrochlorothiazide (HYDRODIURIL) 25 MG tablet  Take 25 mg by mouth daily.    Marland Kitchen levothyroxine (SYNTHROID, LEVOTHROID) 75 MCG tablet Take 75 mcg by mouth daily.    Marland Kitchen NIACIN CR PO Take 1 tablet by mouth daily.     . Omega 3 1000 MG CAPS Take 4 capsules by mouth daily.    Marland Kitchen OVER THE COUNTER MEDICATION Take 4 each by mouth daily. Juicy juice multivitamin - chewable 2 vegetable and 2 fruit.    Marland Kitchen POTASSIUM PO Take 1 tablet by mouth daily.     . predniSONE (STERAPRED UNI-PAK 21 TAB) 10 MG (21) TBPK tablet Take 1 tablet (10 mg total) by mouth daily. Take 6 tabs by mouth daily  for 2 days, then 5 tabs for 2 days, then 4 tabs for 2 days, then 3 tabs for 2 days, 2 tabs for 2 days, then 1 tab by mouth daily for 2 days 42 tablet 0  . Red Yeast Rice Extract (RED YEAST RICE PO) Take 1 tablet by mouth daily.     Marland Kitchen triamcinolone cream (KENALOG) 0.1 % Apply 1 application topically 4 (four) times daily as needed. rash  1  . vitamin C (ASCORBIC ACID) 500 MG tablet Take 500 mg by mouth daily.    . beclomethasone (QVAR) 40 MCG/ACT inhaler Inhale 1  puff into the lungs 2 (two) times daily. WITH SPACER. RINSE MOUTH THOROUGHLY WITH WATER AFTER EACH USE. (Patient not taking: Reported on 02/22/2016) 1 Inhaler 12   No current facility-administered medications on file prior to visit.      Review of Systems Constitutional:   No  weight loss, night sweats,  Fevers, chills, fatigue, or  lassitude.  HEENT:   No headaches,  Difficulty swallowing,  Tooth/dental problems, or  Sore throat,                No sneezing, itching, ear ache,  +nasal congestion, post nasal drip,   CV:  No chest pain,  Orthopnea, PND, swelling in lower extremities, anasarca, dizziness, palpitations, syncope.   GI  No heartburn, indigestion, abdominal pain, nausea, vomiting, diarrhea, change in bowel habits, loss of appetite, bloody stools.   Resp:    No chest wall deformity  Skin: no rash or lesions.  GU: no dysuria, change in color of urine, no urgency or frequency.  No flank pain, no  hematuria   MS:  No joint pain or swelling.  No decreased range of motion.  No back pain.  Psych:  No change in mood or affect. No depression or anxiety.  No memory loss.         Objective:   Physical Exam  Filed Vitals:   02/22/16 1422  BP: 120/74  Pulse: 55  Temp: 97.5 F (36.4 C)  TempSrc: Oral  Height: 5\' 7"  (1.702 m)  Weight: 139 lb (63.05 kg)  SpO2: 96%   GEN: A/Ox3; pleasant , NAD   HEENT:  [place]/AT,  EACs-clear, TMs-wnl, NOSE-clear, THROAT-clear, no lesions, no postnasal drip or exudate noted.   NECK:  Supple w/ fair ROM; no JVD; normal carotid impulses w/o bruits; no thyromegaly or nodules palpated; no lymphadenopathy.  RESP  Clear  P & A; w/o, wheezes/ rales/ or rhonchi.no accessory muscle use, no dullness to percussion  CARD:  RRR, no m/r/g  , no peripheral edema, pulses intact, no cyanosis or clubbing.  GI:   Soft & nt; nml bowel sounds; no organomegaly or masses detected.  Musco: Warm bil, no deformities or joint swelling noted.   Neuro: alert, no focal deficits noted.    Skin: Warm, no lesions or rashes  [name] NP-C  [facility]  02/22/2016       Assessment & Plan:

## 2016-02-22 NOTE — Progress Notes (Signed)
Quick Note:  Called spoke with pt. Reviewed results and recs. Pt voiced understanding and had no further questions. ______ 

## 2016-02-22 NOTE — Assessment & Plan Note (Signed)
Recurrent /slow to resolve bronchitis with cough   Plan  Finish Prednisone taper as directed.  Delsym 2 tsp Twice daily  For cough .  Hydromet 1 tsp every 6hr As needed  Cough , may make you sleepy.  Zyrtec 10mg  At bedtime   Labs today  mucinex Twice daily  For congestion/cough.  Use sips of water to soothe throat ,  Do not clear thorat . NO mints .  follow up as planned in 1 month and As needed   Please contact office for sooner follow up if symptoms do not improve or worsen or seek emergency care

## 2016-02-23 LAB — RESPIRATORY ALLERGY PROFILE REGION II ~~LOC~~
Allergen, Cedar tree, t12: <0.1 kU/L
Allergen, Comm Silver Birch, t9: <0.1 kU/L
Allergen, Cottonwood, t14: <0.1 kU/L
Allergen, D pternoyssinus,d7: <0.1 kU/L
Allergen, Mouse Urine Protein, e78: <0.1 kU/L
Allergen, Mulberry, t76: <0.1 kU/L
Allergen, Oak,t7: <0.1 kU/L
Alternaria Alternata: <0.1 kU/L
Aspergillus fumigatus, m3: <0.1 kU/L
Bermuda Grass: <0.1 kU/L
Box Elder IgE: <0.1 kU/L
Cat Dander: <0.1 kU/L
Cladosporium Herbarum: <0.1 kU/L
Cockroach: <0.1 kU/L
Common Ragweed: <0.1 kU/L
D. farinae: <0.1 kU/L
Dog Dander: <0.1 kU/L
Elm IgE: <0.1 kU/L
IgE (Immunoglobulin E), Serum: 196 kU/L — ABNORMAL HIGH (ref <115)
Johnson Grass: <0.1 kU/L
Pecan/Hickory Tree IgE: <0.1 kU/L
Penicillium Notatum: <0.1 kU/L
Rough Pigweed  IgE: <0.1 kU/L
Sheep Sorrel IgE: <0.1 kU/L
Timothy Grass: <0.1 kU/L

## 2016-02-28 NOTE — Progress Notes (Signed)
Quick Note:  Called spoke with pt. Reviewed results and recs. Pt voiced understanding and had no further questions. ______ 

## 2016-03-01 ENCOUNTER — Telehealth: Payer: Self-pay | Admitting: Pulmonary Disease

## 2016-03-01 NOTE — Telephone Encounter (Signed)
Spoke with pt and she states that cough has improved but still has occasional "burning" sensation in chest. Advised pt that she can slowly taper off medications, starting with Mucinex then Delsym then Hydromet. Advised pt that if cough returns to try Delsym and or Mucinex. Also advised that if condition worsens she should call office. Nothing further needed.

## 2016-03-26 DIAGNOSIS — L03115 Cellulitis of right lower limb: Secondary | ICD-10-CM | POA: Diagnosis not present

## 2016-04-01 ENCOUNTER — Encounter: Payer: Self-pay | Admitting: Adult Health

## 2016-04-01 ENCOUNTER — Ambulatory Visit (INDEPENDENT_AMBULATORY_CARE_PROVIDER_SITE_OTHER): Payer: Medicare Other | Admitting: Adult Health

## 2016-04-01 DIAGNOSIS — J479 Bronchiectasis, uncomplicated: Secondary | ICD-10-CM | POA: Diagnosis not present

## 2016-04-01 DIAGNOSIS — L570 Actinic keratosis: Secondary | ICD-10-CM | POA: Diagnosis not present

## 2016-04-01 DIAGNOSIS — L814 Other melanin hyperpigmentation: Secondary | ICD-10-CM | POA: Diagnosis not present

## 2016-04-01 NOTE — Progress Notes (Signed)
Subjective:    Patient ID: [place], female    DOB: 1946-05-05, 70 y.o.   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 From [place] endemic area for histoplasmosis .    04/01/2016 Follow up : Cough .  Pt presents for a 1 month follow up for slow to resolve bronchitis with cough . She was treated with omnicef and zpack . Along with steroid taper x 2. Last ov she was tx w/ steroid taper and cough suppressants.  She returns today feeling better with resolved cough . Feeling better  Labs showed IgE 196 , RAST neg. WBC was elevated (on steroids ) , eos ok .  Discussed repeat labs today , she has CPX with PCP this week with labs.   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 Married .  TDAP is utd. PPD recently neg.  Did have a rash on back , seen by dermatology x 2 . Self limited and resolved. Path did not identified.  Rash and cough developed after trip to [place]. 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  Denies chest pain, orthopnea, edema or fever.    Past Medical History:  Diagnosis Date  . Elevated cholesterol   . H/O blood clots 1990   Current Outpatient Prescriptions on File Prior to Visit  Medication Sig Dispense Refill  . acetaminophen (TYLENOL) 500 MG tablet Take 500 mg by mouth daily as needed for moderate pain. Reported on 02/01/2016    . albuterol (PROVENTIL HFA;VENTOLIN HFA) 108 (90 Base) MCG/ACT inhaler Inhale 2 puffs into the lungs every 4 (four) hours as needed for wheezing or shortness of breath. 1 Inhaler 5  . Ascorbic Acid (VITAMIN C PO) Take 1 tablet by mouth daily.    Marland Kitchen aspirin 81 MG tablet Take 162 mg by mouth daily.     Marland Kitchen atenolol (TENORMIN) 25 MG tablet Take 6.25 mg by mouth daily.     . hydrochlorothiazide (HYDRODIURIL) 25 MG tablet Take 25 mg by mouth daily.    Marland Kitchen  levothyroxine (SYNTHROID, LEVOTHROID) 75 MCG tablet Take 75 mcg by mouth daily.    Marland Kitchen NIACIN CR PO Take 1 tablet by mouth daily.     . Omega 3 1000 MG CAPS Take 4 capsules by mouth daily.    Marland Kitchen OVER THE COUNTER MEDICATION Take 4 each by mouth daily. Juicy juice multivitamin - chewable 2 vegetable and 2 fruit.    Marland Kitchen POTASSIUM PO Take 1 tablet by mouth daily.     . Red Yeast Rice Extract (RED YEAST RICE PO) Take 1 tablet by mouth daily.     . vitamin C (ASCORBIC ACID) 500 MG tablet Take 500 mg by mouth daily.    . beclomethasone (QVAR) 40 MCG/ACT inhaler Inhale 1 puff into the lungs 2 (two) times daily. WITH SPACER. RINSE MOUTH THOROUGHLY WITH WATER AFTER EACH USE. (Patient not taking: Reported on 04/01/2016) 1 Inhaler 12  . Dextromethorphan-Guaifenesin 15-400 MG TABS Take 1 tablet by mouth every 4 (four) hours as needed (COUGH). (Patient not taking: Reported on 04/01/2016) 84 each 0  . HYDROcodone-homatropine (HYDROMET) 5-1.5 MG/5ML syrup Take 5 mLs by mouth every 6 (six) hours as needed. (Patient not taking: Reported on 04/01/2016) 240 mL 0   No current facility-administered medications on file prior  to visit.       Review of Systems Constitutional:   No  weight loss, night sweats,  Fevers, chills, fatigue, or  lassitude.  HEENT:   No headaches,  Difficulty swallowing,  Tooth/dental problems, or  Sore throat,                No sneezing, itching, ear ache, nasal congestion, post nasal drip,   CV:  No chest pain,  Orthopnea, PND, swelling in lower extremities, anasarca, dizziness, palpitations, syncope.   GI  No heartburn, indigestion, abdominal pain, nausea, vomiting, diarrhea, change in bowel habits, loss of appetite, bloody stools.   Resp:    No chest wall deformity  Skin: no rash or lesions.  GU: no dysuria, change in color of urine, no urgency or frequency.  No flank pain, no hematuria   MS:  No joint pain or swelling.  No decreased range of motion.  No back pain.  Psych:  No change in  mood or affect. No depression or anxiety.  No memory loss.         Objective:   Physical Exam  Vitals:   04/01/16 1523  BP: 136/86  Pulse: 71  Temp: 98.3 F (36.8 C)  TempSrc: Oral  SpO2: 97%  Weight: 138 lb (62.6 kg)  Height: 5\' 7"  (1.702 m)   GEN: A/Ox3; pleasant , NAD    HEENT:  [place]/AT,  EACs-clear, TMs-wnl, NOSE-clear, THROAT-clear, no lesions, no postnasal drip or exudate noted.   NECK:  Supple w/ fair ROM; no JVD; normal carotid impulses w/o bruits; no thyromegaly or nodules palpated; no lymphadenopathy.    RESP  Clear  P & A; w/o, wheezes/ rales/ or rhonchi. no accessory muscle use, no dullness to percussion  CARD:  RRR, no m/r/g  , no peripheral edema, pulses intact, no cyanosis or clubbing.  GI:   Soft & nt; nml bowel sounds; no organomegaly or masses detected.   Musco: Warm bil, no deformities or joint swelling noted.   Neuro: alert, no focal deficits noted.    Skin: Warm, no lesions or rashes  Tammy Parrett NP-C  [facility]  04/01/2016       Assessment & Plan:

## 2016-04-01 NOTE — Assessment & Plan Note (Signed)
Recent flare with cough , now resolved  Plan  d1elsym 2 tsp Twice daily  As needed  Cough .  Zyrtec 10mg  At bedtime  As needed  Drainage.  Follow up with Primary Care for upcoming physical and labs Follow up with Dr. Murlean Iba in 3-4 months and As needed

## 2016-04-01 NOTE — Patient Instructions (Addendum)
Delsym 2 tsp Twice daily  As needed  Cough .  Zyrtec 10mg  At bedtime  As needed  Drainage.  Follow up with Primary Care for upcoming physical and labs Follow up with Dr. Murlean Iba in 3-4 months and As needed

## 2016-04-02 DIAGNOSIS — E785 Hyperlipidemia, unspecified: Secondary | ICD-10-CM | POA: Diagnosis not present

## 2016-04-02 DIAGNOSIS — R079 Chest pain, unspecified: Secondary | ICD-10-CM | POA: Diagnosis not present

## 2016-04-02 DIAGNOSIS — R002 Palpitations: Secondary | ICD-10-CM | POA: Diagnosis not present

## 2016-04-02 DIAGNOSIS — E039 Hypothyroidism, unspecified: Secondary | ICD-10-CM | POA: Diagnosis not present

## 2016-04-04 DIAGNOSIS — R7303 Prediabetes: Secondary | ICD-10-CM | POA: Diagnosis not present

## 2016-04-04 DIAGNOSIS — R600 Localized edema: Secondary | ICD-10-CM | POA: Diagnosis not present

## 2016-04-04 DIAGNOSIS — E785 Hyperlipidemia, unspecified: Secondary | ICD-10-CM | POA: Diagnosis not present

## 2016-04-04 DIAGNOSIS — G43909 Migraine, unspecified, not intractable, without status migrainosus: Secondary | ICD-10-CM | POA: Diagnosis not present

## 2016-04-04 DIAGNOSIS — E039 Hypothyroidism, unspecified: Secondary | ICD-10-CM | POA: Diagnosis not present

## 2016-04-10 DIAGNOSIS — Z79899 Other long term (current) drug therapy: Secondary | ICD-10-CM | POA: Diagnosis not present

## 2016-04-10 DIAGNOSIS — R079 Chest pain, unspecified: Secondary | ICD-10-CM | POA: Diagnosis not present

## 2016-04-10 DIAGNOSIS — R0602 Shortness of breath: Secondary | ICD-10-CM | POA: Diagnosis not present

## 2016-04-10 DIAGNOSIS — R0789 Other chest pain: Secondary | ICD-10-CM | POA: Diagnosis not present

## 2016-04-10 DIAGNOSIS — Z87898 Personal history of other specified conditions: Secondary | ICD-10-CM | POA: Diagnosis not present

## 2016-04-10 DIAGNOSIS — E785 Hyperlipidemia, unspecified: Secondary | ICD-10-CM | POA: Diagnosis not present

## 2016-04-10 DIAGNOSIS — Z8701 Personal history of pneumonia (recurrent): Secondary | ICD-10-CM | POA: Diagnosis not present

## 2016-04-10 DIAGNOSIS — Z8249 Family history of ischemic heart disease and other diseases of the circulatory system: Secondary | ICD-10-CM | POA: Diagnosis not present

## 2016-04-10 DIAGNOSIS — Z8709 Personal history of other diseases of the respiratory system: Secondary | ICD-10-CM | POA: Diagnosis not present

## 2016-04-10 DIAGNOSIS — R001 Bradycardia, unspecified: Secondary | ICD-10-CM | POA: Diagnosis not present

## 2016-04-10 DIAGNOSIS — E876 Hypokalemia: Secondary | ICD-10-CM | POA: Diagnosis not present

## 2016-04-10 DIAGNOSIS — E039 Hypothyroidism, unspecified: Secondary | ICD-10-CM | POA: Diagnosis not present

## 2016-04-10 DIAGNOSIS — E079 Disorder of thyroid, unspecified: Secondary | ICD-10-CM | POA: Diagnosis not present

## 2016-04-10 DIAGNOSIS — Z8669 Personal history of other diseases of the nervous system and sense organs: Secondary | ICD-10-CM | POA: Diagnosis not present

## 2016-04-11 DIAGNOSIS — R0602 Shortness of breath: Secondary | ICD-10-CM | POA: Diagnosis not present

## 2016-04-11 DIAGNOSIS — I361 Nonrheumatic tricuspid (valve) insufficiency: Secondary | ICD-10-CM | POA: Diagnosis not present

## 2016-04-11 DIAGNOSIS — E785 Hyperlipidemia, unspecified: Secondary | ICD-10-CM | POA: Diagnosis not present

## 2016-04-11 DIAGNOSIS — R079 Chest pain, unspecified: Secondary | ICD-10-CM | POA: Diagnosis not present

## 2016-04-11 DIAGNOSIS — I080 Rheumatic disorders of both mitral and aortic valves: Secondary | ICD-10-CM | POA: Diagnosis not present

## 2016-04-11 DIAGNOSIS — E079 Disorder of thyroid, unspecified: Secondary | ICD-10-CM | POA: Diagnosis not present

## 2016-04-11 DIAGNOSIS — E876 Hypokalemia: Secondary | ICD-10-CM | POA: Diagnosis not present

## 2016-05-02 ENCOUNTER — Ambulatory Visit (INDEPENDENT_AMBULATORY_CARE_PROVIDER_SITE_OTHER): Payer: Medicare Other | Admitting: Pulmonary Disease

## 2016-05-02 ENCOUNTER — Ambulatory Visit (INDEPENDENT_AMBULATORY_CARE_PROVIDER_SITE_OTHER)
Admission: RE | Admit: 2016-05-02 | Discharge: 2016-05-02 | Disposition: A | Discharge status: Home / Self Care | Payer: Medicare Other | Source: Ambulatory Visit | Attending: Pulmonary Disease | Admitting: Pulmonary Disease

## 2016-05-02 ENCOUNTER — Other Ambulatory Visit (INDEPENDENT_AMBULATORY_CARE_PROVIDER_SITE_OTHER): Payer: Medicare Other

## 2016-05-02 ENCOUNTER — Encounter: Payer: Self-pay | Admitting: Pulmonary Disease

## 2016-05-02 VITALS — BP 118/78 | HR 57 | Ht 67.0 in | Wt 140.0 lb

## 2016-05-02 DIAGNOSIS — J479 Bronchiectasis, uncomplicated: Secondary | ICD-10-CM

## 2016-05-02 DIAGNOSIS — J209 Acute bronchitis, unspecified: Secondary | ICD-10-CM

## 2016-05-02 DIAGNOSIS — R05 Cough: Secondary | ICD-10-CM | POA: Diagnosis not present

## 2016-05-02 DIAGNOSIS — R0600 Dyspnea, unspecified: Secondary | ICD-10-CM

## 2016-05-02 DIAGNOSIS — R079 Chest pain, unspecified: Secondary | ICD-10-CM | POA: Diagnosis not present

## 2016-05-02 LAB — CBC WITH DIFFERENTIAL/PLATELET
Basophils Absolute: 0 10*3/uL (ref 0.0–0.1)
Basophils Relative: 0.5 % (ref 0.0–3.0)
Eosinophils Absolute: 0.2 10*3/uL (ref 0.0–0.7)
Eosinophils Relative: 3 % (ref 0.0–5.0)
HCT: 42.5 % (ref 36.0–46.0)
Hemoglobin: 14.4 g/dL (ref 12.0–15.0)
Lymphocytes Relative: 32.5 % (ref 12.0–46.0)
Lymphs Abs: 2.7 10*3/uL (ref 0.7–4.0)
MCHC: 33.8 g/dL (ref 30.0–36.0)
MCV: 93.2 fl (ref 78.0–100.0)
Monocytes Absolute: 0.6 10*3/uL (ref 0.1–1.0)
Monocytes Relative: 7.1 % (ref 3.0–12.0)
Neutro Abs: 4.7 10*3/uL (ref 1.4–7.7)
Neutrophils Relative %: 56.9 % (ref 43.0–77.0)
Platelets: 324 10*3/uL (ref 150.0–400.0)
RBC: 4.56 Mil/uL (ref 3.87–5.11)
RDW: 13.4 % (ref 11.5–15.5)
WBC: 8.2 10*3/uL (ref 4.0–10.5)

## 2016-05-02 MED ORDER — BUDESONIDE-FORMOTEROL FUMARATE 80-4.5 MCG/ACT IN AERO: 2.0000 | INHALATION_SPRAY | Freq: Two times a day (BID) | RESPIRATORY_TRACT | 0 refills | Status: DC

## 2016-05-02 NOTE — Addendum Note (Signed)
Addended by: Wynn Banker H on: 05/02/2016 03:08 PM   Modules accepted: Orders

## 2016-05-02 NOTE — Progress Notes (Signed)
Subjective:    Patient ID: [place], female    DOB: Apr 11, 1946, 70 y.o.   MRN: [id number]  HPI [place] is a 70 y.o. female fpreviously followed by Dr. 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 PPD negative, minimal smoking history ( Quit 1975). 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 She was okay during the trip. When she got back, she had a bout of bronchitis for which she ended up being on Z-Pak. She also got UTI for which she got ciprofloxacin. She then developed maculopapular erythematous rash at her back. It was biopsied and the diagnosis allegedly was shingles. Not sure if it's a drug reaction since she is also allergic to Levaquin. Rash got better. She had another bronchitis about after that for which she ended up being on Z-Pak again.  Pt was seen by SG on 02/01/16 as f/u on cough. That time, her cough is better and she said she was at baseline.   Since seeing SG, she was doing okay until 3 days ago. Started having cough, congestion, fevers. 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 No recent other sick contacts. No recent travel. No pets. Her house is clean. No carpets. No stagnant water.  CXR this am : no definite infiltarte seen.   ROV 05/02/16 Patient returns to the office urgently as follow-up on her cough. Since last seen in June, she improved with antibiotics and prednisone. She was in [place] start of August and was admitted at the ER for chest pain. Allegedly, cardiac workup was unremarkable. The last 5 days, she has pleuritic chest pain in her left scapula. She had dry cough today. Because of concern for infection, she is here at the office. 10-pack-year smoking history. Chest x-ray personally reviewed today. No active infiltrates seen. Hyperinflated lungs.   Review of Systems  Constitutional: Positive for  fatigue. Negative for fever.  HENT: Positive for congestion.   Eyes: Negative.   Respiratory: Positive for cough and shortness of breath. Negative for wheezing.   Cardiovascular: Negative.   Gastrointestinal: Negative.   Endocrine: Negative.   Genitourinary: Negative.   Musculoskeletal: Negative.   Skin: Negative.   Allergic/Immunologic: Negative.   Neurological: Negative.   Hematological: Negative.   Psychiatric/Behavioral: Negative.   All other systems reviewed and are negative.    Objective:   Physical Exam  Vitals:  Vitals:   05/02/16 1430  BP: 118/78  Pulse: (!) 57  SpO2: 97%  Weight: 140 lb (63.5 kg)  Height: 5\' 7"  (1.702 m)    Constitutional/General:  Pleasant, well-nourished, well-developed, not in any distress,  Comfortably seating.  Well kempt.  Body mass index is 21.93 kg/m. Wt Readings from Last 3 Encounters:  05/02/16 140 lb (63.5 kg)  04/01/16 138 lb (62.6 kg)  02/22/16 139 lb (63 kg)     HEENT: Pupils equal and reactive to light and accommodation. Anicteric sclerae. Normal nasal mucosa.   No oral  lesions,  mouth clear,  oropharynx clear, no postnasal drip. (-) Oral thrush. No dental caries.  Airway - Mallampati class III  Neck: No masses. Midline trachea. No JVD, (-) LAD. (-) bruits appreciated.  Respiratory/Chest: Grossly normal chest. (-) deformity. (-) Accessory muscle use.  Symmetric expansion. (-) Tenderness on palpation.  Resonant on percussion.  Diminished BS on both lower lung zones. (-) crackles,  rhonchi. Occasional wheeze in L post lung.  (-) egophony  Cardiovascular: Regular rate and  rhythm, heart sounds normal, no murmur or gallops, no peripheral edema  Gastrointestinal:  Normal bowel sounds. Soft, non-tender. No hepatosplenomegaly.  (-) masses.   Musculoskeletal:  Normal muscle tone. Normal gait.   Extremities: Grossly normal. (-) clubbing, cyanosis.  (-) edema  Skin: (-) rash,lesions seen.   Neurological/Psychiatric :  alert, oriented to time, place, person. Normal mood and affect            Assessment & Plan:  Dyspnea Patient is here as urgent follow-up on her dyspnea. I saw her in June for bronchitis. She improved with antibiotics. The last 5 days, complaining of left-sided posterior pleuritic chest pain and cough today. She states, since being back from a mission trip in [place] in March [address], she's been having infection almost monthly period Chest x-ray today: Hyperinflated lungs, no infiltrates seen. Roughly 10-pack-year smoking history, quit in her late 51s. I'm beginning to suspect she probably has undiagnosed COPD. Not a big smoker but her chest x-ray looks hyperinflated. Pleuritic chest pain could be related to acute COPD exacerbation. Plan : 1. trial with Symbicort 80/4.5, 2 puffs twice a day. We will give her a sample today. If she is better, we will prescribe that as well as albuterol. 2. She was supposed to have PFT in May or June but was not done. Plan to do PFT at 38. 3. Recent cardiac workup in [place] at the start of the month was unremarkable. May need to get records from [place]. 4. Told patient to give [place] a call if she is not better. At that point, we may need to repeat chest CT scan. She had a chest CT scan in May/2017 which showed COPD changes, mild bronchiectasis at the bases, calcified granuloma likely from previous infection as she was from [place].  Acute bronchitis Allegedly with different infections since coming back from [place] in March [address]. Current chest x-ray without infiltrates.  Hold off on antibiotics for now. If dyspnea and other symptoms are persistent, plan to get a chest CT scan. Check immunoglobulin M, G, A, E. Mentioned to her regarding need for infectious disease consultation per their suggestion.  Bronchiectasis ([place]) Mild  bronchiectasis based on chest CT scan in May/2017. Observe for now.  I personally reviewed previous images (Chest  Xray, Chest Ct scan) done on this patient. I reviewed the reports on the images as well.     Return to clinic in 6- 8 weeks     J. Shirl Harris, MD 05/02/2016, 3:02 PM [facility] Pager [phone] After 3 pm or if no answer, call [phone]

## 2016-05-02 NOTE — Assessment & Plan Note (Signed)
Patient is here as urgent follow-up on her dyspnea. I saw her in June for bronchitis. She improved with antibiotics. The last 5 days, complaining of left-sided posterior pleuritic chest pain and cough today. She states, since being back from a mission trip in [place] in March [place], she's been having infection almost monthly period Chest x-ray today: Hyperinflated lungs, no infiltrates seen. Roughly 10-pack-year smoking history, quit in her late 50s. I'm beginning to suspect she probably has undiagnosed COPD. Not a big smoker but her chest x-ray looks hyperinflated. Pleuritic chest pain could be related to acute COPD exacerbation. Plan : 1. trial with Symbicort 80/4.5, 2 puffs twice a day. We will give her a sample today. If she is better, we will prescribe that as well as albuterol. 2. She was supposed to have PFT in May or June but was not done. Plan to do PFT at 67. 3. Recent cardiac workup in [place] at the start of the month was unremarkable. May need to get records from [place]. 4. Told patient to give [place] a call if she is not better. At that point, we may need to repeat chest CT scan. She had a chest CT scan in May/2017 which showed COPD changes, mild bronchiectasis at the bases, calcified granuloma likely from previous infection as she was from [place].

## 2016-05-02 NOTE — Patient Instructions (Signed)
It was a pleasure taking care of you today!  You may have Chronic Obstructive Pulmonary Disease or COPD.  COPD is a preventable and treatable disease that makes it difficult to empty air out of the lungs (airflow obstruction).  This can lead to shortness of breath.   Sometimes, when you have a lung infection, this can make your breathing worse, and will cause you to have a COPD flare-up or an acute exacerbation of COPD. Please call your primary care doctor or the office if you are having a COPD flare-up.   Smoking makes COPD worse.   Make sure you use your medications for COPD -- Maintenance medications : Trial with Symbicort, 80/4.5, 2 puffs twice a day. Let us know if that works. If it does, we will prescribe it.  Please rinse your mouth each time you use your maintenance medication.  Please call the office if you are having issues with your medications  We'll do blood work today.  Return to clinic in 2 mos.

## 2016-05-02 NOTE — Assessment & Plan Note (Signed)
Mild  bronchiectasis based on chest CT scan in May/2017. Observe for now.

## 2016-05-02 NOTE — Assessment & Plan Note (Signed)
Allegedly with different infections since coming back from [place] in March [address]. Current chest x-ray without infiltrates.  Hold off on antibiotics for now. If dyspnea and other symptoms are persistent, plan to get a chest CT scan. Check immunoglobulin M, G, A, E. Mentioned to her regarding need for infectious disease consultation per their suggestion.

## 2016-05-03 LAB — IGE: IgE (Immunoglobulin E), Serum: 166 kU/L — ABNORMAL HIGH (ref <115)

## 2016-05-03 LAB — IGG, IGA, IGM
IgA: 117 mg/dL (ref 81–463)
IgG (Immunoglobin G), Serum: 896 mg/dL (ref 694–1618)
IgM, Serum: 51 mg/dL (ref 48–271)

## 2016-05-16 ENCOUNTER — Ambulatory Visit (HOSPITAL_COMMUNITY)
Admission: RE | Admit: 2016-05-16 | Discharge: 2016-05-16 | Disposition: A | Discharge status: Home / Self Care | Payer: Medicare Other | Source: Ambulatory Visit | Attending: Pulmonary Disease | Admitting: Pulmonary Disease

## 2016-05-16 DIAGNOSIS — R0600 Dyspnea, unspecified: Secondary | ICD-10-CM | POA: Diagnosis not present

## 2016-05-16 DIAGNOSIS — J479 Bronchiectasis, uncomplicated: Secondary | ICD-10-CM | POA: Diagnosis not present

## 2016-05-16 DIAGNOSIS — R7301 Impaired fasting glucose: Secondary | ICD-10-CM | POA: Diagnosis not present

## 2016-05-16 DIAGNOSIS — E785 Hyperlipidemia, unspecified: Secondary | ICD-10-CM | POA: Diagnosis not present

## 2016-05-16 LAB — PULMONARY FUNCTION TEST
DL/VA % pred: 69 %
DL/VA: 3.58 ml/min/mmHg/L
DLCO cor % pred: 68 %
DLCO cor: 19.41 ml/min/mmHg
DLCO unc % pred: 70 %
DLCO unc: 19.98 ml/min/mmHg
FEF 25-75 Post: 2.18 L/sec
FEF 25-75 Pre: 2.46 L/sec
FEF2575-%Change-Post: -11 %
FEF2575-%Pred-Post: 101 %
FEF2575-%Pred-Pre: 114 %
FEV1-%Change-Post: -3 %
FEV1-%Pred-Post: 100 %
FEV1-%Pred-Pre: 103 %
FEV1-Post: 2.62 L
FEV1-Pre: 2.7 L
FEV1FVC-%Change-Post: 2 %
FEV1FVC-%Pred-Pre: 102 %
FEV6-%Change-Post: -5 %
FEV6-%Pred-Post: 98 %
FEV6-%Pred-Pre: 104 %
FEV6-Post: 3.25 L
FEV6-Pre: 3.43 L
FEV6FVC-%Change-Post: 0 %
FEV6FVC-%Pred-Post: 103 %
FEV6FVC-%Pred-Pre: 103 %
FVC-%Change-Post: -5 %
FVC-%Pred-Post: 94 %
FVC-%Pred-Pre: 100 %
FVC-Post: 3.26 L
FVC-Pre: 3.45 L
Post FEV1/FVC ratio: 80 %
Post FEV6/FVC ratio: 100 %
Pre FEV1/FVC ratio: 78 %
Pre FEV6/FVC Ratio: 99 %
RV % pred: 101 %
RV: 2.34 L
TLC % pred: 107 %
TLC: 5.94 L

## 2016-05-16 MED ORDER — ALBUTEROL SULFATE (2.5 MG/3ML) 0.083% IN NEBU
2.5000 mg | INHALATION_SOLUTION | Freq: Once | RESPIRATORY_TRACT | Status: AC
  Administered 2016-05-16: 2.5 mg via RESPIRATORY_TRACT

## 2016-05-28 DIAGNOSIS — R002 Palpitations: Secondary | ICD-10-CM | POA: Diagnosis not present

## 2016-05-28 DIAGNOSIS — R079 Chest pain, unspecified: Secondary | ICD-10-CM | POA: Diagnosis not present

## 2016-05-28 DIAGNOSIS — E039 Hypothyroidism, unspecified: Secondary | ICD-10-CM | POA: Diagnosis not present

## 2016-05-28 DIAGNOSIS — E785 Hyperlipidemia, unspecified: Secondary | ICD-10-CM | POA: Diagnosis not present

## 2016-06-03 DIAGNOSIS — D1801 Hemangioma of skin and subcutaneous tissue: Secondary | ICD-10-CM | POA: Diagnosis not present

## 2016-06-03 DIAGNOSIS — D229 Melanocytic nevi, unspecified: Secondary | ICD-10-CM | POA: Diagnosis not present

## 2016-06-03 DIAGNOSIS — D485 Neoplasm of uncertain behavior of skin: Secondary | ICD-10-CM | POA: Diagnosis not present

## 2016-06-03 DIAGNOSIS — Z23 Encounter for immunization: Secondary | ICD-10-CM | POA: Diagnosis not present

## 2016-06-03 DIAGNOSIS — L814 Other melanin hyperpigmentation: Secondary | ICD-10-CM | POA: Diagnosis not present

## 2016-06-03 DIAGNOSIS — L821 Other seborrheic keratosis: Secondary | ICD-10-CM | POA: Diagnosis not present

## 2016-06-03 DIAGNOSIS — L570 Actinic keratosis: Secondary | ICD-10-CM | POA: Diagnosis not present

## 2016-06-05 ENCOUNTER — Telehealth: Payer: Self-pay | Admitting: Pulmonary Disease

## 2016-06-05 DIAGNOSIS — D0471 Carcinoma in situ of skin of right lower limb, including hip: Secondary | ICD-10-CM | POA: Diagnosis not present

## 2016-06-05 NOTE — Telephone Encounter (Signed)
Notes Recorded by Beckie Busing, CMA on 05/31/2016 at 4:55 PM EDT Attempted to contact patient regarding results. Left message on voicemail for patient to return call. ------ Notes Recorded by Len Blalock, CMA on 05/20/2016 at 2:26 PM EDT lmtcb X1 for pt to relay results/recs. ------ Notes Recorded by Rush Landmark, MD on 05/18/2016 at 5:52 PM EDT pls tell pt PFT was normal. No evidence for COPD. -------------------------------------------------------------------------------------- Spoke with pt. She is aware of results. Nothing further was needed.

## 2016-06-13 DIAGNOSIS — R490 Dysphonia: Secondary | ICD-10-CM | POA: Diagnosis not present

## 2016-06-13 DIAGNOSIS — E039 Hypothyroidism, unspecified: Secondary | ICD-10-CM | POA: Diagnosis not present

## 2016-06-13 DIAGNOSIS — E049 Nontoxic goiter, unspecified: Secondary | ICD-10-CM | POA: Diagnosis not present

## 2016-06-15 ENCOUNTER — Other Ambulatory Visit: Payer: Self-pay | Admitting: Family Medicine

## 2016-06-15 DIAGNOSIS — E049 Nontoxic goiter, unspecified: Secondary | ICD-10-CM

## 2016-06-21 ENCOUNTER — Ambulatory Visit
Admission: RE | Admit: 2016-06-21 | Discharge: 2016-06-21 | Disposition: A | Discharge status: Home / Self Care | Payer: Medicare Other | Source: Ambulatory Visit | Attending: Family Medicine | Admitting: Family Medicine

## 2016-06-21 DIAGNOSIS — E042 Nontoxic multinodular goiter: Secondary | ICD-10-CM | POA: Diagnosis not present

## 2016-06-21 DIAGNOSIS — E049 Nontoxic goiter, unspecified: Secondary | ICD-10-CM

## 2016-06-28 DIAGNOSIS — D0471 Carcinoma in situ of skin of right lower limb, including hip: Secondary | ICD-10-CM | POA: Diagnosis not present

## 2016-07-18 ENCOUNTER — Ambulatory Visit: Payer: Medicare Other | Admitting: Pulmonary Disease

## 2016-08-04 ENCOUNTER — Emergency Department (HOSPITAL_COMMUNITY)
Admission: EM | Admit: 2016-08-04 | Discharge: 2016-08-04 | Disposition: A | Discharge status: Home / Self Care | Payer: Medicare Other | Attending: Emergency Medicine | Admitting: Emergency Medicine

## 2016-08-04 ENCOUNTER — Emergency Department (HOSPITAL_COMMUNITY): Payer: Medicare Other

## 2016-08-04 ENCOUNTER — Encounter (HOSPITAL_COMMUNITY): Payer: Self-pay | Admitting: Emergency Medicine

## 2016-08-04 DIAGNOSIS — M79661 Pain in right lower leg: Secondary | ICD-10-CM | POA: Diagnosis not present

## 2016-08-04 DIAGNOSIS — I10 Essential (primary) hypertension: Secondary | ICD-10-CM | POA: Diagnosis not present

## 2016-08-04 DIAGNOSIS — Z87891 Personal history of nicotine dependence: Secondary | ICD-10-CM | POA: Insufficient documentation

## 2016-08-04 DIAGNOSIS — E039 Hypothyroidism, unspecified: Secondary | ICD-10-CM | POA: Diagnosis not present

## 2016-08-04 DIAGNOSIS — Z79899 Other long term (current) drug therapy: Secondary | ICD-10-CM | POA: Insufficient documentation

## 2016-08-04 DIAGNOSIS — Z7982 Long term (current) use of aspirin: Secondary | ICD-10-CM | POA: Diagnosis not present

## 2016-08-04 DIAGNOSIS — M79604 Pain in right leg: Secondary | ICD-10-CM

## 2016-08-04 DIAGNOSIS — J449 Chronic obstructive pulmonary disease, unspecified: Secondary | ICD-10-CM | POA: Diagnosis not present

## 2016-08-04 LAB — I-STAT TROPONIN, ED: Troponin i, poc: 0 ng/mL (ref 0.00–0.08)

## 2016-08-04 LAB — CBC WITH DIFFERENTIAL/PLATELET
Basophils Absolute: 0 K/uL (ref 0.0–0.1)
Basophils Relative: 1 %
Eosinophils Absolute: 0.2 K/uL (ref 0.0–0.7)
Eosinophils Relative: 3 %
HCT: 39.9 % (ref 36.0–46.0)
Hemoglobin: 13.8 g/dL (ref 12.0–15.0)
Lymphocytes Relative: 36 %
Lymphs Abs: 2.9 K/uL (ref 0.7–4.0)
MCH: 31.4 pg (ref 26.0–34.0)
MCHC: 34.6 g/dL (ref 30.0–36.0)
MCV: 90.7 fL (ref 78.0–100.0)
Monocytes Absolute: 0.6 K/uL (ref 0.1–1.0)
Monocytes Relative: 7 %
Neutro Abs: 4.4 K/uL (ref 1.7–7.7)
Neutrophils Relative %: 53 %
Platelets: 289 K/uL (ref 150–400)
RBC: 4.4 MIL/uL (ref 3.87–5.11)
RDW: 13.5 % (ref 11.5–15.5)
WBC: 8.3 K/uL (ref 4.0–10.5)

## 2016-08-04 LAB — D-DIMER, QUANTITATIVE: D-Dimer, Quant: 0.33 ug/mL-FEU (ref 0.00–0.50)

## 2016-08-04 LAB — BASIC METABOLIC PANEL WITH GFR
Anion gap: 8 (ref 5–15)
BUN: 18 mg/dL (ref 6–20)
CO2: 30 mmol/L (ref 22–32)
Calcium: 9.3 mg/dL (ref 8.9–10.3)
Chloride: 97 mmol/L — ABNORMAL LOW (ref 101–111)
Creatinine, Ser: 0.81 mg/dL (ref 0.44–1.00)
GFR calc Af Amer: >60 mL/min
GFR calc non Af Amer: >60 mL/min
Glucose, Bld: 105 mg/dL — ABNORMAL HIGH (ref 65–99)
Potassium: 3.3 mmol/L — ABNORMAL LOW (ref 3.5–5.1)
Sodium: 135 mmol/L (ref 135–145)

## 2016-08-04 NOTE — ED Notes (Signed)
ED Provider at bedside. 

## 2016-08-04 NOTE — Discharge Instructions (Signed)
Take 4 over the counter ibuprofen tablets 3 times a day or 2 over-the-counter naproxen tablets twice a day for pain. Also take tylenol 1000mg(2 extra strength) four times a day.    

## 2016-08-04 NOTE — ED Notes (Signed)
Patient transported to X-ray 

## 2016-08-04 NOTE — ED Provider Notes (Signed)
[facility] DEPT Provider Note   CSN: [id number] Arrival date & time: 08/04/16  1506     History   Chief Complaint Chief Complaint  Patient presents with  . Leg Pain    HPI Sandra Ellis is a 70 y.o. female.  R calf pain going on for the past couple of days.  Denies injury.  Denies swelling.  Continues to exercise every day without symptoms. Woke up this morning with pressure in her chest, radiating to the jaw.  Some mild sob, diaphoresis.  Resolved spontaneously in 5 min.  No persistent symptoms.  Remote hx of PE.    The history is provided by the patient.  Leg Pain   This is a new problem. The current episode started 2 days ago. The problem occurs constantly. The problem has not changed since onset.The pain is present in the right lower leg. The quality of the pain is described as sharp. The pain is at a severity of 4/10. The pain is mild. Pertinent negatives include no numbness, full range of motion, no stiffness and no tingling. She has tried nothing for the symptoms. The treatment provided no relief. There has been no history of extremity trauma.    Past Medical History:  Diagnosis Date  . Elevated cholesterol   . H/O blood clots 1990    Patient Active Problem List   Diagnosis Date Noted  . Acute bronchitis 02/15/2016  . Bronchiectasis ([place]) 02/15/2016  . Cough 02/15/2016  . Bronchiectasis without acute exacerbation ([place]) 02/01/2016  . Dyspnea 01/11/2016  . Pulmonary nodule 11/18/2014  . Essential hypertension, benign 12/29/2013  . Unspecified hypothyroidism 12/29/2013    Past Surgical History:  Procedure Laterality Date  . KNEE SURGERY     cyst removed  . tonisllectomy    . VAGINAL HYSTERECTOMY     TVH    OB History    Gravida Para Term Preterm AB Living   3 3       3    SAB TAB Ectopic Multiple Live Births                   Home Medications    Prior to Admission medications   Medication Sig Start Date End Date Taking? Authorizing Provider    albuterol (PROVENTIL HFA;VENTOLIN HFA) 108 (90 Base) MCG/ACT inhaler Inhale 2 puffs into the lungs every 4 (four) hours as needed for wheezing or shortness of breath. 01/11/16  Yes Magdalen Spatz, NP  aspirin 81 MG tablet Take 162 mg by mouth daily.    Yes Historical Provider, MD  atenolol (TENORMIN) 25 MG tablet Take 12.5 mg by mouth daily.    Yes Historical Provider, MD  Coenzyme Q10 (COQ10 PO) Take by mouth.   Yes Historical Provider, MD  hydrochlorothiazide (HYDRODIURIL) 25 MG tablet Take 25 mg by mouth daily.   Yes Historical Provider, MD  levothyroxine (SYNTHROID, LEVOTHROID) 75 MCG tablet Take 75 mcg by mouth daily.   Yes Historical Provider, MD  Multiple Vitamins-Minerals (HAIR SKIN AND NAILS FORMULA PO) Take 1 tablet by mouth daily.   Yes Historical Provider, MD  Omega 3 1000 MG CAPS Take 1 capsule by mouth 2 (two) times daily.    Yes Historical Provider, MD  OVER THE COUNTER MEDICATION Take 4 each by mouth daily. Juicy juice multivitamin - chewable 2 vegetable and 2 fruit.   Yes Historical Provider, MD  pravastatin (PRAVACHOL) 20 MG tablet Take 20 mg by mouth daily.  04/04/16  Yes Historical Provider, MD  Probiotic  Product (PROBIOTIC PO) Take 2 tablets by mouth daily.   Yes Historical Provider, MD  SELENIUM PO Take 1 tablet by mouth daily.   Yes Historical Provider, MD  budesonide-formoterol (SYMBICORT) 80-4.5 MCG/ACT inhaler Inhale 2 puffs into the lungs 2 (two) times daily. 05/02/16 05/03/16  Jose Shirl Harris, MD    Family History Family History  Problem Relation Age of Onset  . Diabetes Mother   . Asthma Mother     Social History Social History  Substance Use Topics  . Smoking status: Former Smoker    Packs/day: 0.50    Years: 8.00    Types: Cigarettes    Quit date: 09/02/1973  . Smokeless tobacco: Not on file  . Alcohol use No     Allergies   Levaquin [levofloxacin in d5w]   Review of Systems Review of Systems  Constitutional: Negative for chills and fever.   HENT: Negative for congestion and rhinorrhea.   Eyes: Negative for redness and visual disturbance.  Respiratory: Positive for shortness of breath. Negative for wheezing.   Cardiovascular: Positive for chest pain. Negative for palpitations.  Gastrointestinal: Negative for nausea and vomiting.  Genitourinary: Negative for dysuria and urgency.  Musculoskeletal: Positive for myalgias. Negative for arthralgias and stiffness.  Skin: Negative for pallor and wound.  Neurological: Negative for dizziness, tingling, numbness and headaches.     Physical Exam Updated Vital Signs BP 113/60   Pulse (!) 58   Temp 98.1 F (36.7 C) (Oral)   Resp 16   Ht 5\' 7"  (1.702 m)   Wt 137 lb (62.1 kg)   SpO2 98%   BMI 21.46 kg/m   Physical Exam  Constitutional: She is oriented to person, place, and time. She appears well-developed and well-nourished. No distress.  HENT:  Head: Normocephalic and atraumatic.  Eyes: EOM are normal. Pupils are equal, round, and reactive to light.  Neck: Normal range of motion. Neck supple.  Cardiovascular: Normal rate and regular rhythm.  Exam reveals no gallop and no friction rub.   No murmur heard. Pulmonary/Chest: Effort normal. She has no wheezes. She has no rales.  Abdominal: Soft. She exhibits no distension. There is no tenderness.  Musculoskeletal: She exhibits no edema or tenderness.  No noted swelling, erythema.  Unable to reproduce pain, negative homans. PMS intact  Neurological: She is alert and oriented to person, place, and time.  Skin: Skin is warm and dry. She is not diaphoretic.  Psychiatric: She has a normal mood and affect. Her behavior is normal.  Nursing note and vitals reviewed.    ED Treatments / Results  Labs (all labs ordered are listed, but only abnormal results are displayed) Labs Reviewed  BASIC METABOLIC PANEL - Abnormal; Notable for the following:       Result Value   Potassium 3.3 (*)    Chloride 97 (*)    Glucose, Bld 105 (*)     All other components within normal limits  CBC WITH DIFFERENTIAL/PLATELET  D-DIMER, QUANTITATIVE (NOT AT [facility])  I-STAT TROPOININ, ED    EKG  EKG Interpretation  Date/Time:  Sunday August 04 2016 15:47:48 EST Ventricular Rate:  58 PR Interval:    QRS Duration: 86 QT Interval:  450 QTC Calculation: 442 R Axis:   41 Text Interpretation:  Sinus rhythm Low voltage, precordial leads No significant change since last tracing Confirmed by Anayah Arvanitis MD, DANIEL [phone]) on 08/04/2016 4:12:56 PM       Radiology Dg Chest 2 View  Result Date: 08/04/2016  CLINICAL DATA:  Right calf pain.  Ex-smoker. EXAM: CHEST  2 VIEW COMPARISON:  05/02/2016. FINDINGS: Normal sized heart. Clear lungs. Stable small calcified granulomata in the right lung. Mild thoracic spine degenerative changes. The lungs remain mildly hyperexpanded. IMPRESSION: Stable mild changes of COPD.  No acute abnormality. Electronically Signed   By: Claudie Revering M.D.   On: 08/04/2016 16:12    Procedures Procedures (including critical care time)  Medications Ordered in ED Medications - No data to display   Initial Impression / Assessment and Plan / ED Course  I have reviewed the triage vital signs and the nursing notes.  Pertinent labs & imaging results that were available during my care of the patient were reviewed by me and considered in my medical decision making (see chart for details).  Clinical Course     70 y.o. F with chest pain and RLE pain.  Chest pain been resolved without recurrence for 12 hours. Single trop negative feel no reason for repeat.  ddimer negative, doubt PE or dvt.  PCP follow up.   8:34 PM:  I have discussed the diagnosis/risks/treatment options with the patient and caregiver and believe the pt to be eligible for discharge home to follow-up with PCP. We also discussed returning to the ED immediately if new or worsening sx occur. We discussed the sx which are most concerning (e.g., sudden worsening pain,  fever, inability to tolerate by mouth) that necessitate immediate return. Medications administered to the patient during their visit and any new prescriptions provided to the patient are listed below.  Medications given during this visit Medications - No data to display   The patient appears reasonably screen and/or stabilized for discharge and I doubt any other medical condition or other [facility] requiring further screening, evaluation, or treatment in the ED at this time prior to discharge.     Final Clinical Impressions(s) / ED Diagnoses   Final diagnoses:  Right leg pain    New Prescriptions Discharge Medication List as of 08/04/2016  5:08 PM       Deno Etienne, DO 08/04/16 2034

## 2016-08-04 NOTE — ED Triage Notes (Signed)
Patient c/o right calf pain x couple days that has been constant. Patient has had PE many years ago.

## 2016-09-07 DIAGNOSIS — M545 Low back pain: Secondary | ICD-10-CM | POA: Diagnosis not present

## 2016-09-10 DIAGNOSIS — R109 Unspecified abdominal pain: Secondary | ICD-10-CM | POA: Diagnosis not present

## 2016-09-10 DIAGNOSIS — N3001 Acute cystitis with hematuria: Secondary | ICD-10-CM | POA: Diagnosis not present

## 2016-09-19 ENCOUNTER — Encounter: Payer: Medicare Other | Admitting: Women's Health

## 2016-09-24 ENCOUNTER — Encounter: Payer: Self-pay | Admitting: Women's Health

## 2016-09-24 ENCOUNTER — Ambulatory Visit (INDEPENDENT_AMBULATORY_CARE_PROVIDER_SITE_OTHER): Payer: Medicare Other | Admitting: Women's Health

## 2016-09-24 VITALS — BP 106/68 | Ht 67.0 in | Wt 139.0 lb

## 2016-09-24 DIAGNOSIS — N952 Postmenopausal atrophic vaginitis: Secondary | ICD-10-CM | POA: Diagnosis not present

## 2016-09-24 DIAGNOSIS — Z01419 Encounter for gynecological examination (general) (routine) without abnormal findings: Secondary | ICD-10-CM

## 2016-09-24 NOTE — Progress Notes (Signed)
Sandra Ellis 1946/08/30 [id number]    History:    Presents for breast and pelvic exam. 1969 TAH for DUB on no HRT. Hypertension, hypercholesterolemia and hypothyroidism managed by primary care. Has had no shingles and Pneumovax. 2016 negative colonoscopy. Osteopenia declines treatment a repeat DEXA. Normal Pap and mammogram history.   Past medical history, past surgical history, family history and social history were all reviewed and documented in the EPIC chart. 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 Originally from [place]. Father died at age 75, mother currently 73 living independently.  ROS:  A ROS was performed and pertinent positives and negatives are included.  Exam:  Vitals:   09/24/16 1125  BP: 106/68  Weight: 139 lb (63 kg)  Height: 5\' 7"  (1.702 m)   Body mass index is 21.77 kg/m.   General appearance:  Normal Thyroid:  Symmetrical, normal in size, without palpable masses or nodularity. Respiratory  Auscultation:  Clear without wheezing or rhonchi Cardiovascular  Auscultation:  Regular rate, without rubs, murmurs or gallops  Edema/varicosities:  Not grossly evident Abdominal  Soft,nontender, without masses, guarding or rebound.  Liver/spleen:  No organomegaly noted  Hernia:  None appreciated  Skin  Inspection:  Grossly normal   Breasts: Examined lying and sitting.     Right: Without masses, retractions, discharge or axillary adenopathy.     Left: Without masses, retractions, discharge or axillary adenopathy. Gentitourinary   Inguinal/mons:  Normal without inguinal adenopathy  External genitalia:  Normal  BUS/Urethra/Skene's glands:  Normal  Vagina:  Atrophic  Cervix: And uterus absent Adnexa/parametria:     Rt: Without masses or tenderness.   Lt: Without masses or tenderness.  Anus and perineum: Normal  Digital rectal exam: Normal sphincter tone without palpated masses or tenderness  Assessment/Plan:  71  y.o. MWF G3 P3 for breast and pelvic exam with no complaints.  71 TAH for DUB on no HRT. Vaginal atrophy Osteopenia declines treatment  Hypertension/hypercholesterolemia/hyperthyroidism-primary care manages labs and meds  Plan:  Continue healthy lifestyle of regular exercise and stationary bike, treadmill, weights, pushups daily. SBE's, continue annual 3-D screening mammogram has scheduled next month. Continue vaginal lubricants with intercourse. Reviewed importance of weightbearing exercise, home safety and fall prevention discussed. Declines repeat DEXA.    Huel Cote WHNP, 1:30 PM 09/24/2016

## 2016-09-24 NOTE — Patient Instructions (Signed)

## 2016-10-17 ENCOUNTER — Encounter: Payer: Self-pay | Admitting: Women's Health

## 2016-10-17 DIAGNOSIS — E039 Hypothyroidism, unspecified: Secondary | ICD-10-CM | POA: Diagnosis not present

## 2016-10-17 DIAGNOSIS — Z1231 Encounter for screening mammogram for malignant neoplasm of breast: Secondary | ICD-10-CM | POA: Diagnosis not present

## 2016-10-17 DIAGNOSIS — E7800 Pure hypercholesterolemia, unspecified: Secondary | ICD-10-CM | POA: Diagnosis not present

## 2016-10-17 DIAGNOSIS — R600 Localized edema: Secondary | ICD-10-CM | POA: Diagnosis not present

## 2016-10-17 DIAGNOSIS — Z0000 Encounter for general adult medical examination without abnormal findings: Secondary | ICD-10-CM | POA: Diagnosis not present

## 2016-10-17 DIAGNOSIS — G43909 Migraine, unspecified, not intractable, without status migrainosus: Secondary | ICD-10-CM | POA: Diagnosis not present

## 2016-10-17 DIAGNOSIS — R7303 Prediabetes: Secondary | ICD-10-CM | POA: Diagnosis not present

## 2016-10-21 DIAGNOSIS — M25552 Pain in left hip: Secondary | ICD-10-CM | POA: Diagnosis not present

## 2016-10-24 DIAGNOSIS — M25552 Pain in left hip: Secondary | ICD-10-CM | POA: Diagnosis not present

## 2016-10-29 DIAGNOSIS — M1612 Unilateral primary osteoarthritis, left hip: Secondary | ICD-10-CM | POA: Diagnosis not present

## 2016-10-29 DIAGNOSIS — M25552 Pain in left hip: Secondary | ICD-10-CM | POA: Diagnosis not present

## 2016-10-31 DIAGNOSIS — R079 Chest pain, unspecified: Secondary | ICD-10-CM | POA: Diagnosis not present

## 2016-10-31 DIAGNOSIS — E039 Hypothyroidism, unspecified: Secondary | ICD-10-CM | POA: Diagnosis not present

## 2016-10-31 DIAGNOSIS — Z01810 Encounter for preprocedural cardiovascular examination: Secondary | ICD-10-CM | POA: Diagnosis not present

## 2016-10-31 DIAGNOSIS — E785 Hyperlipidemia, unspecified: Secondary | ICD-10-CM | POA: Diagnosis not present

## 2016-11-15 ENCOUNTER — Ambulatory Visit (INDEPENDENT_AMBULATORY_CARE_PROVIDER_SITE_OTHER): Payer: Medicare Other | Admitting: Gynecology

## 2016-11-15 ENCOUNTER — Encounter: Payer: Self-pay | Admitting: Gynecology

## 2016-11-15 VITALS — BP 110/70

## 2016-11-15 DIAGNOSIS — N390 Urinary tract infection, site not specified: Secondary | ICD-10-CM | POA: Diagnosis not present

## 2016-11-15 LAB — URINALYSIS W MICROSCOPIC + REFLEX CULTURE
Bilirubin Urine: NEGATIVE
Casts: NONE SEEN [LPF]
Crystals: NONE SEEN [HPF]
Glucose, UA: NEGATIVE
Ketones, ur: NEGATIVE
Nitrite: NEGATIVE
Protein, ur: NEGATIVE
Specific Gravity, Urine: <1.005 (ref 1.001–1.035)
Yeast: NONE SEEN [HPF]
pH: 6 (ref 5.0–8.0)

## 2016-11-15 MED ORDER — SULFAMETHOXAZOLE-TRIMETHOPRIM 800-160 MG PO TABS: 1.0000 | ORAL_TABLET | Freq: Two times a day (BID) | ORAL | 0 refills | Status: DC

## 2016-11-15 NOTE — Progress Notes (Signed)
    Divya Munshi 04/22/1946 [id number]        71 y.o.  G3P3 presents with one-day history of low back pain and some very mild suprapubic discomfort. She drank a lot of lemon juice this morning and her symptoms seem to have gotten better. No fever or chills. No urgency frequency vaginal discharge over or irritation.  Past medical history,surgical history, problem list, medications, allergies, family history and social history were all reviewed and documented in the EPIC chart.  Directed ROS with pertinent positives and negatives documented in the history of present illness/assessment and plan.  Exam: Vitals:   11/15/16 1356  BP: 110/70   General appearance:  Normal Spine straight without CVA tenderness Abdomen soft nontender without masses guarding rebound  Assessment/Plan:  71 y.o. G3P3 with history as above. Urinalysis shows 6-10 WBC few bacteria 0-2 RBC. Reviewed with patient may be early low-grade UTI versus unrelated low back pain. As this is Friday afternoon I'm going to cover her with Septra DS 1 by mouth twice a day 3 days and follow up on culture. ASAP call precautions reviewed over the weekend. Follow up if symptoms persist or recur.    Anastasio Auerbach MD, 2:16 PM 11/15/2016

## 2016-11-15 NOTE — Addendum Note (Signed)
Addended by: Thurnell Garbe A on: 11/15/2016 03:10 PM   Modules accepted: Orders

## 2016-11-15 NOTE — Patient Instructions (Signed)
Take antibiotics twice daily for 3 days

## 2016-11-17 LAB — URINE CULTURE

## 2016-11-19 DIAGNOSIS — M25561 Pain in right knee: Secondary | ICD-10-CM | POA: Diagnosis not present

## 2016-11-26 ENCOUNTER — Other Ambulatory Visit: Payer: Self-pay | Admitting: Orthopedic Surgery

## 2016-12-02 DIAGNOSIS — R1011 Right upper quadrant pain: Secondary | ICD-10-CM | POA: Diagnosis not present

## 2016-12-03 ENCOUNTER — Encounter: Payer: Self-pay | Admitting: Women's Health

## 2016-12-03 ENCOUNTER — Other Ambulatory Visit: Payer: Self-pay | Admitting: Family Medicine

## 2016-12-03 ENCOUNTER — Ambulatory Visit (INDEPENDENT_AMBULATORY_CARE_PROVIDER_SITE_OTHER): Payer: Medicare Other | Admitting: Women's Health

## 2016-12-03 VITALS — BP 106/70

## 2016-12-03 DIAGNOSIS — R319 Hematuria, unspecified: Secondary | ICD-10-CM

## 2016-12-03 DIAGNOSIS — N898 Other specified noninflammatory disorders of vagina: Secondary | ICD-10-CM | POA: Diagnosis not present

## 2016-12-03 DIAGNOSIS — R1011 Right upper quadrant pain: Secondary | ICD-10-CM

## 2016-12-03 LAB — WET PREP FOR TRICH, YEAST, CLUE
Clue Cells Wet Prep HPF POC: NONE SEEN
Trich, Wet Prep: NONE SEEN
Yeast Wet Prep HPF POC: NONE SEEN

## 2016-12-03 LAB — URINALYSIS W MICROSCOPIC + REFLEX CULTURE
Bilirubin Urine: NEGATIVE
Casts: NONE SEEN [LPF]
Crystals: NONE SEEN [HPF]
Glucose, UA: NEGATIVE
Ketones, ur: NEGATIVE
Leukocytes, UA: NEGATIVE
Nitrite: NEGATIVE
Protein, ur: NEGATIVE
Specific Gravity, Urine: 1.01 (ref 1.001–1.035)
Yeast: NONE SEEN [HPF]
pH: 7 (ref 5.0–8.0)

## 2016-12-03 NOTE — Addendum Note (Signed)
Addended by: Thurnell Garbe A on: 12/03/2016 04:49 PM   Modules accepted: Orders

## 2016-12-03 NOTE — Progress Notes (Signed)
Presents with complaint of low back pain that radiated towards her groin which is minimal at this time. Rates discomfort at 2. Questionable blood in the urine, is worried that she could have a potential kidney stone, has had one in the past. Scheduled for hip replacement surgery April 18 and wanted to make sure she would be okay to have surgery. Has not seen a urologist and would like a referral. Was having some external vaginal itching used topical yogurt with some relief.  Denies vaginal discharge discharge, pain with urination, fever.  Exam: Appears well, comfortable. No CVAT or back discomfort at all. Abdomen soft nontender, no radiation of pain, discomfort in the groin area. External genitalia within normal limits, minimal erythema, wet prep done with a Q-tip. Wet prep negative.  Bimanual, no fullness or tenderness with exam. UA: Trace blood, 0-2 RBCs, negative leukocytes, 0-5 WBCs, few bacteria  Resolving groin discomfort History of kidney stones  Plan: Urine culture pending. Reassurance given regarding normality of exam. Will schedule referral to urologist due to history of kidney stones per request.

## 2016-12-04 ENCOUNTER — Telehealth: Payer: Self-pay | Admitting: *Deleted

## 2016-12-04 LAB — URINE CULTURE

## 2016-12-04 NOTE — Telephone Encounter (Signed)
-----   Message from Huel Cote, NP sent at 12/03/2016  3:33 PM EDT ----- Please schedule her for urology consult history of kidney stones, none at that present time but would like to see a urologist. Any time is okay in June. Having hip surgery April 18. thanks

## 2016-12-04 NOTE — Telephone Encounter (Signed)
Notes faxed to [facility] they will fax me back with time and date to relay to patient.

## 2016-12-05 ENCOUNTER — Ambulatory Visit
Admission: RE | Admit: 2016-12-05 | Discharge: 2016-12-05 | Disposition: A | Discharge status: Home / Self Care | Payer: Medicare Other | Source: Ambulatory Visit | Attending: Family Medicine | Admitting: Family Medicine

## 2016-12-05 DIAGNOSIS — R1011 Right upper quadrant pain: Secondary | ICD-10-CM

## 2016-12-10 ENCOUNTER — Other Ambulatory Visit: Payer: Medicare Other

## 2016-12-10 NOTE — Patient Instructions (Signed)
Sandra Ellis  12/10/2016   Your procedure is scheduled on:   Report to [facility] Main  Entrance [place]  elevators to 3rd floor to  [place] at    0930 AM.    Call this number if you have problems the morning of surgery [phone]    Remember: ONLY 1 PERSON MAY GO WITH YOU TO SHORT STAY TO GET  READY MORNING OF [name].  Do not eat food or drink liquids :After Midnight.     Take these medicines the morning of surgery with A SIP OF WATER: synthroid                                You may not have any metal on your body including hair pins and              piercings  Do not wear jewelry, make-up, lotions, powders or perfumes, deodorant             Do not wear nail polish.  Do not shave  48 hours prior to surgery.               Do not bring valuables to the hospital. [place].  Contacts, dentures or bridgework may not be worn into surgery.  Leave suitcase in the car. After surgery it may be brought to your room.                 Please read over the following fact sheets you were given: _____________________________________________________________________           [facility] - Preparing for Surgery Before surgery, you can play an important role.  Because skin is not sterile, your skin needs to be as free of germs as possible.  You can reduce the number of germs on your skin by washing with CHG (chlorahexidine gluconate) soap before surgery.  CHG is an antiseptic cleaner which kills germs and bonds with the skin to continue killing germs even after washing. Please DO NOT use if you have an allergy to CHG or antibacterial soaps.  If your skin becomes reddened/irritated stop using the CHG and inform your nurse when you arrive at Short Stay. Do not shave (including legs and underarms) for at least 48 hours prior to the first CHG shower.  You may shave your face/neck. Please follow these  instructions carefully:  1.  Shower with CHG Soap the night before surgery and the  morning of Surgery.  2.  If you choose to wash your hair, wash your hair first as usual with your  normal  shampoo.  3.  After you shampoo, rinse your hair and body thoroughly to remove the  shampoo.                           4.  Use CHG as you would any other liquid soap.  You can apply chg directly  to the skin and wash                       Gently with a scrungie or clean washcloth.  5.  Apply the CHG Soap to your body ONLY FROM  THE NECK DOWN.   Do not use on face/ open                           Wound or open sores. Avoid contact with eyes, ears mouth and genitals (private parts).                       Wash face,  Genitals (private parts) with your normal soap.             6.  Wash thoroughly, paying special attention to the area where your surgery  will be performed.  7.  Thoroughly rinse your body with warm water from the neck down.  8.  DO NOT shower/wash with your normal soap after using and rinsing off  the CHG Soap.                9.  Pat yourself dry with a clean towel.            10.  Wear clean pajamas.            11.  Place clean sheets on your bed the night of your first shower and do not  sleep with pets. Day of Surgery : Do not apply any lotions/deodorants the morning of surgery.  Please wear clean clothes to the hospital/surgery center.  FAILURE TO FOLLOW THESE INSTRUCTIONS MAY RESULT IN THE CANCELLATION OF YOUR SURGERY PATIENT SIGNATURE_________________________________  NURSE SIGNATURE__________________________________  ________________________________________________________________________  WHAT IS A BLOOD TRANSFUSION? Blood Transfusion Information  A transfusion is the replacement of blood or some of its parts. Blood is made up of multiple cells which provide different functions.  Red blood cells carry oxygen and are used for blood loss replacement.  White blood cells fight against  infection.  Platelets control bleeding.  Plasma helps clot blood.  Other blood products are available for specialized needs, such as hemophilia or other clotting disorders. BEFORE THE TRANSFUSION  Who gives blood for transfusions?   Healthy volunteers who are fully evaluated to make sure their blood is safe. This is blood bank blood. Transfusion therapy is the safest it has ever been in the practice of medicine. Before blood is taken from a donor, a complete history is taken to make sure that person has no history of diseases nor engages in risky social behavior (examples are intravenous drug use or sexual activity with multiple partners). The donor's travel history is screened to minimize risk of transmitting infections, such as malaria. The donated blood is tested for signs of infectious diseases, such as HIV and hepatitis. The blood is then tested to be sure it is compatible with you in order to minimize the chance of a transfusion reaction. If you or a relative donates blood, this is often done in anticipation of surgery and is not appropriate for emergency situations. It takes many days to process the donated blood. RISKS AND COMPLICATIONS Although transfusion therapy is very safe and saves many lives, the main dangers of transfusion include:   Getting an infectious disease.  Developing a transfusion reaction. This is an allergic reaction to something in the blood you were given. Every precaution is taken to prevent this. The decision to have a blood transfusion has been considered carefully by your caregiver before blood is given. Blood is not given unless the benefits outweigh the risks. AFTER THE TRANSFUSION  Right after receiving a blood transfusion, you will usually feel much better and  more energetic. This is especially true if your red blood cells have gotten low (anemic). The transfusion raises the level of the red blood cells which carry oxygen, and this usually causes an energy  increase.  The nurse administering the transfusion will monitor you carefully for complications. HOME CARE INSTRUCTIONS  No special instructions are needed after a transfusion. You may find your energy is better. Speak with your caregiver about any limitations on activity for underlying diseases you may have. SEEK MEDICAL CARE IF:   Your condition is not improving after your transfusion.  You develop redness or irritation at the intravenous (IV) site. SEEK IMMEDIATE MEDICAL CARE IF:  Any of the following symptoms occur over the next 12 hours:  Shaking chills.  You have a temperature by mouth above 102 F (38.9 C), not controlled by medicine.  Chest, back, or muscle pain.  People around you feel you are not acting correctly or are confused.  Shortness of breath or difficulty breathing.  Dizziness and fainting.  You get a rash or develop hives.  You have a decrease in urine output.  Your urine turns a dark color or changes to pink, red, or brown. Any of the following symptoms occur over the next 10 days:  You have a temperature by mouth above 102 F (38.9 C), not controlled by medicine.  Shortness of breath.  Weakness after normal activity.  The white part of the eye turns yellow (jaundice).  You have a decrease in the amount of urine or are urinating less often.  Your urine turns a dark color or changes to pink, red, or brown. Document Released: 08/16/2000 Document Revised: 11/11/2011 Document Reviewed: 04/04/2008 ExitCare Patient Information 2014 [place].  _______________________________________________________________________  Incentive Spirometer  An incentive spirometer is a tool that can help keep your lungs clear and active. This tool measures how well you are filling your lungs with each breath. Taking long deep breaths may help reverse or decrease the chance of developing breathing (pulmonary) problems (especially infection) following:  A long  period of time when you are unable to move or be active. BEFORE THE PROCEDURE   If the spirometer includes an indicator to show your best effort, your nurse or respiratory therapist will set it to a desired goal.  If possible, sit up straight or lean slightly forward. Try not to slouch.  Hold the incentive spirometer in an upright position. INSTRUCTIONS FOR USE  1. Sit on the edge of your bed if possible, or sit up as far as you can in bed or on a chair. 2. Hold the incentive spirometer in an upright position. 3. Breathe out normally. 4. Place the mouthpiece in your mouth and seal your lips tightly around it. 5. Breathe in slowly and as deeply as possible, raising the piston or the ball toward the top of the column. 6. Hold your breath for 3-5 seconds or for as long as possible. Allow the piston or ball to fall to the bottom of the column. 7. Remove the mouthpiece from your mouth and breathe out normally. 8. Rest for a few seconds and repeat Steps 1 through 7 at least 10 times every 1-2 hours when you are awake. Take your time and take a few normal breaths between deep breaths. 9. The spirometer may include an indicator to show your best effort. Use the indicator as a goal to work toward during each repetition. 10. After each set of 10 deep breaths, practice coughing to be sure your lungs  are clear. If you have an incision (the cut made at the time of surgery), support your incision when coughing by placing a pillow or rolled up towels firmly against it. Once you are able to get out of bed, walk around indoors and cough well. You may stop using the incentive spirometer when instructed by your caregiver.  RISKS AND COMPLICATIONS  Take your time so you do not get dizzy or light-headed.  If you are in pain, you may need to take or ask for pain medication before doing incentive spirometry. It is harder to take a deep breath if you are having pain. AFTER USE  Rest and breathe slowly and  easily.  It can be helpful to keep track of a log of your progress. Your caregiver can provide you with a simple table to help with this. If you are using the spirometer at home, follow these instructions: [place] IF:   You are having difficultly using the spirometer.  You have trouble using the spirometer as often as instructed.  Your pain medication is not giving enough relief while using the spirometer.  You develop fever of 100.5 F (38.1 C) or higher. SEEK IMMEDIATE MEDICAL CARE IF:   You cough up bloody sputum that had not been present before.  You develop fever of 102 F (38.9 C) or greater.  You develop worsening pain at or near the incision site. MAKE SURE YOU:   Understand these instructions.  Will watch your condition.  Will get help right away if you are not doing well or get worse. Document Released: 12/30/2006 Document Revised: 11/11/2011 Document Reviewed: 03/02/2007 [facility] Patient Information 2014 [place], [place].   ________________________________________________________________________

## 2016-12-12 ENCOUNTER — Inpatient Hospital Stay (HOSPITAL_COMMUNITY)
Admission: RE | Admit: 2016-12-12 | Discharge: 2016-12-12 | Disposition: A | Discharge status: Home / Self Care | Payer: Medicare Other | Source: Ambulatory Visit

## 2016-12-18 ENCOUNTER — Encounter (HOSPITAL_COMMUNITY): Admission: RE | Payer: Self-pay | Source: Ambulatory Visit

## 2016-12-18 ENCOUNTER — Inpatient Hospital Stay (HOSPITAL_COMMUNITY): Admission: RE | Admit: 2016-12-18 | Payer: Medicare Other | Source: Ambulatory Visit | Admitting: Orthopedic Surgery

## 2016-12-18 DIAGNOSIS — M1612 Unilateral primary osteoarthritis, left hip: Secondary | ICD-10-CM | POA: Diagnosis not present

## 2016-12-18 SURGERY — ARTHROPLASTY, HIP, TOTAL,POSTERIOR APPROACH
Anesthesia: General | Site: Hip | Laterality: Left

## 2016-12-18 NOTE — Telephone Encounter (Signed)
Appointment on 6//7/18 @ 9:15am with Dr.Manny left message for pt to call.

## 2016-12-18 NOTE — Telephone Encounter (Signed)
Pt called back stating she is not ready to schedule this and will call back once she is ready. I called [facility] and cancel appointment.

## 2016-12-19 ENCOUNTER — Other Ambulatory Visit: Payer: Self-pay | Admitting: Orthopedic Surgery

## 2016-12-24 NOTE — Progress Notes (Signed)
08-04-16 (EPIC) EKG 08-04-16 (EPIC) CXR

## 2016-12-25 NOTE — Patient Instructions (Signed)
Debralee Braaksma  12/25/2016   Your procedure is scheduled on: 12-31-16   Report to [facility] Main  Entrance Report to admitting at 0900 AM   Call this number if you have problems the morning of surgery [phone]   Remember: ONLY 1 PERSON MAY GO WITH YOU TO SHORT STAY TO GET  READY MORNING OF [place].  Do not eat food or drink liquids :After Midnight.     Take these medicines the morning of surgery with A SIP OF WATER: Levothyroxine (Synthroid)                                You may not have any metal on your body including hair pins and              piercings  Do not wear jewelry, make-up, lotions, powders or perfumes, deodorant             Do not wear nail polish.  Do not shave  48 hours prior to surgery.              Men may shave face and neck.   Do not bring valuables to the hospital. [place].  Contacts, dentures or bridgework may not be worn into surgery.  Leave suitcase in the car. After surgery it may be brought to your room.     [facility] - Preparing for Surgery Before surgery, you can play an important role.  Because skin is not sterile, your skin needs to be as free of germs as possible.  You can reduce the number of germs on your skin by washing with CHG (chlorahexidine gluconate) soap before surgery.  CHG is an antiseptic cleaner which kills germs and bonds with the skin to continue killing germs even after washing. Please DO NOT use if you have an allergy to CHG or antibacterial soaps.  If your skin becomes reddened/irritated stop using the CHG and inform your nurse when you arrive at Short Stay. Do not shave (including legs and underarms) for at least 48 hours prior to the first CHG shower.  You may shave your face/neck. Please follow these instructions carefully:  1.  Shower with CHG Soap the night before surgery and the  morning of Surgery.  2.  If you choose to wash your hair, wash  your hair first as usual with your  normal  shampoo.  3.  After you shampoo, rinse your hair and body thoroughly to remove the  shampoo.                           4.  Use CHG as you would any other liquid soap.  You can apply chg directly  to the skin and wash                       Gently with a scrungie or clean washcloth.  5.  Apply the CHG Soap to your body ONLY FROM THE NECK DOWN.   Do not use on face/ open  Wound or open sores. Avoid contact with eyes, ears mouth and genitals (private parts).                       Wash face,  Genitals (private parts) with your normal soap.             6.  Wash thoroughly, paying special attention to the area where your surgery  will be performed.  7.  Thoroughly rinse your body with warm water from the neck down.  8.  DO NOT shower/wash with your normal soap after using and rinsing off  the CHG Soap.                9.  Pat yourself dry with a clean towel.            10.  Wear clean pajamas.            11.  Place clean sheets on your bed the night of your first shower and do not  sleep with pets. Day of Surgery : Do not apply any lotions/deodorants the morning of surgery.  Please wear clean clothes to the hospital/surgery center.  FAILURE TO FOLLOW THESE INSTRUCTIONS MAY RESULT IN THE CANCELLATION OF YOUR SURGERY PATIENT SIGNATURE_________________________________  NURSE SIGNATURE__________________________________  ________________________________________________________________________   [facility] [place]  An incentive spirometer is a tool that can help keep your lungs clear and active. This tool measures how well you are filling your lungs with each breath. Taking long deep breaths may help reverse or decrease the chance of developing breathing (pulmonary) problems (especially infection) following:  A long period of time when you are unable to move or be active. BEFORE THE PROCEDURE   If the spirometer includes an  indicator to show your best effort, your nurse or respiratory therapist will set it to a desired goal.  If possible, sit up straight or lean slightly forward. Try not to slouch.  Hold the incentive spirometer in an upright position. INSTRUCTIONS FOR USE  1. Sit on the edge of your bed if possible, or sit up as far as you can in bed or on a chair. 2. Hold the incentive spirometer in an upright position. 3. Breathe out normally. 4. Place the mouthpiece in your mouth and seal your lips tightly around it. 5. Breathe in slowly and as deeply as possible, raising the piston or the ball toward the top of the column. 6. Hold your breath for 3-5 seconds or for as long as possible. Allow the piston or ball to fall to the bottom of the column. 7. Remove the mouthpiece from your mouth and breathe out normally. 8. Rest for a few seconds and repeat Steps 1 through 7 at least 10 times every 1-2 hours when you are awake. Take your time and take a few normal breaths between deep breaths. 9. The spirometer may include an indicator to show your best effort. Use the indicator as a goal to work toward during each repetition. 10. After each set of 10 deep breaths, practice coughing to be sure your lungs are clear. If you have an incision (the cut made at the time of surgery), support your incision when coughing by placing a pillow or rolled up towels firmly against it. Once you are able to get out of bed, walk around indoors and cough well. You may stop using the incentive spirometer when instructed by your caregiver.  RISKS AND COMPLICATIONS  Take your time so you do not get  dizzy or light-headed.  If you are in pain, you may need to take or ask for pain medication before doing incentive spirometry. It is harder to take a deep breath if you are having pain. AFTER USE  Rest and breathe slowly and easily.  It can be helpful to keep track of a log of your progress. Your caregiver can provide you with a simple table  to help with this. If you are using the spirometer at home, follow these instructions: [place] IF:   You are having difficultly using the spirometer.  You have trouble using the spirometer as often as instructed.  Your pain medication is not giving enough relief while using the spirometer.  You develop fever of 100.5 F (38.1 C) or higher. SEEK IMMEDIATE MEDICAL CARE IF:   You cough up bloody sputum that had not been present before.  You develop fever of 102 F (38.9 C) or greater.  You develop worsening pain at or near the incision site. MAKE SURE YOU:   Understand these instructions.  Will watch your condition.  Will get help right away if you are not doing well or get worse. Document Released: 12/30/2006 Document Revised: 11/11/2011 Document Reviewed: 03/02/2007 Exit[employer] Patient Information 2014 [employer], [place].   ________________________________________________________________________  WHAT IS A BLOOD TRANSFUSION? Blood Transfusion Information  A transfusion is the replacement of blood or some of its parts. Blood is made up of multiple cells which provide different functions.  Red blood cells carry oxygen and are used for blood loss replacement.  White blood cells fight against infection.  Platelets control bleeding.  Plasma helps clot blood.  Other blood products are available for specialized needs, such as hemophilia or other clotting disorders. BEFORE THE TRANSFUSION  Who gives blood for transfusions?   Healthy volunteers who are fully evaluated to make sure their blood is safe. This is blood bank blood. Transfusion therapy is the safest it has ever been in the practice of medicine. Before blood is taken from a donor, a complete history is taken to make sure that person has no history of diseases nor engages in risky social behavior (examples are intravenous drug use or sexual activity with multiple partners). The donor's travel history is screened  to minimize risk of transmitting infections, such as malaria. The donated blood is tested for signs of infectious diseases, such as HIV and hepatitis. The blood is then tested to be sure it is compatible with you in order to minimize the chance of a transfusion reaction. If you or a relative donates blood, this is often done in anticipation of surgery and is not appropriate for emergency situations. It takes many days to process the donated blood. RISKS AND COMPLICATIONS Although transfusion therapy is very safe and saves many lives, the main dangers of transfusion include:   Getting an infectious disease.  Developing a transfusion reaction. This is an allergic reaction to something in the blood you were given. Every precaution is taken to prevent this. The decision to have a blood transfusion has been considered carefully by your caregiver before blood is given. Blood is not given unless the benefits outweigh the risks. AFTER THE TRANSFUSION  Right after receiving a blood transfusion, you will usually feel much better and more energetic. This is especially true if your red blood cells have gotten low (anemic). The transfusion raises the level of the red blood cells which carry oxygen, and this usually causes an energy increase.  The nurse administering the transfusion will  monitor you carefully for complications. HOME CARE INSTRUCTIONS  No special instructions are needed after a transfusion. You may find your energy is better. Speak with your caregiver about any limitations on activity for underlying diseases you may have. SEEK MEDICAL CARE IF:   Your condition is not improving after your transfusion.  You develop redness or irritation at the intravenous (IV) site. SEEK IMMEDIATE MEDICAL CARE IF:  Any of the following symptoms occur over the next 12 hours:  Shaking chills.  You have a temperature by mouth above 102 F (38.9 C), not controlled by medicine.  Chest, back, or muscle  pain.  People around you feel you are not acting correctly or are confused.  Shortness of breath or difficulty breathing.  Dizziness and fainting.  You get a rash or develop hives.  You have a decrease in urine output.  Your urine turns a dark color or changes to pink, red, or brown. Any of the following symptoms occur over the next 10 days:  You have a temperature by mouth above 102 F (38.9 C), not controlled by medicine.  Shortness of breath.  Weakness after normal activity.  The white part of the eye turns yellow (jaundice).  You have a decrease in the amount of urine or are urinating less often.  Your urine turns a dark color or changes to pink, red, or brown. Document Released: 08/16/2000 Document Revised: 11/11/2011 Document Reviewed: 04/04/2008 Exit[employer] Patient Information 2014 [employer], [place].  _______________________________________________________________________Please read over the following fact sheets you were given: _____________________________________________________________________

## 2016-12-27 ENCOUNTER — Encounter (INDEPENDENT_AMBULATORY_CARE_PROVIDER_SITE_OTHER): Payer: Self-pay

## 2016-12-27 ENCOUNTER — Encounter (HOSPITAL_COMMUNITY): Payer: Self-pay

## 2016-12-27 ENCOUNTER — Encounter (HOSPITAL_COMMUNITY)
Admission: RE | Admit: 2016-12-27 | Discharge: 2016-12-27 | Disposition: A | Discharge status: Home / Self Care | Payer: Medicare Other | Source: Ambulatory Visit | Attending: Orthopedic Surgery | Admitting: Orthopedic Surgery

## 2016-12-27 DIAGNOSIS — Z01812 Encounter for preprocedural laboratory examination: Secondary | ICD-10-CM | POA: Insufficient documentation

## 2016-12-27 DIAGNOSIS — M1612 Unilateral primary osteoarthritis, left hip: Secondary | ICD-10-CM | POA: Diagnosis not present

## 2016-12-27 LAB — CBC
HCT: 40.9 % (ref 36.0–46.0)
Hemoglobin: 14.2 g/dL (ref 12.0–15.0)
MCH: 31.4 pg (ref 26.0–34.0)
MCHC: 34.7 g/dL (ref 30.0–36.0)
MCV: 90.5 fL (ref 78.0–100.0)
Platelets: 273 10*3/uL (ref 150–400)
RBC: 4.52 MIL/uL (ref 3.87–5.11)
RDW: 13.3 % (ref 11.5–15.5)
WBC: 7.2 10*3/uL (ref 4.0–10.5)

## 2016-12-27 LAB — BASIC METABOLIC PANEL
Anion gap: 8 (ref 5–15)
BUN: 19 mg/dL (ref 6–20)
CO2: 28 mmol/L (ref 22–32)
Calcium: 9.5 mg/dL (ref 8.9–10.3)
Chloride: 98 mmol/L — ABNORMAL LOW (ref 101–111)
Creatinine, Ser: 0.87 mg/dL (ref 0.44–1.00)
GFR calc Af Amer: >60 mL/min (ref >60)
GFR calc non Af Amer: >60 mL/min (ref >60)
Glucose, Bld: 104 mg/dL — ABNORMAL HIGH (ref 65–99)
Potassium: 4.2 mmol/L (ref 3.5–5.1)
Sodium: 134 mmol/L — ABNORMAL LOW (ref 135–145)

## 2016-12-27 LAB — SURGICAL PCR SCREEN
MRSA, PCR: NEGATIVE
Staphylococcus aureus: NEGATIVE

## 2016-12-27 LAB — ABO/RH: ABO/RH(D): B NEG

## 2016-12-29 NOTE — H&P (Signed)
TOTAL HIP ADMISSION H&P  Patient is admitted for left total hip arthroplasty, anterior approach.  Subjective:  Chief Complaint:     Left hip primary OA / pain  HPI: Sandra Ellis, 71 y.o. female, has a history of pain and functional disability in the left hip(s) due to arthritis and patient has failed non-surgical conservative treatments for greater than 12 weeks to include NSAID's and/or analgesics and activity modification.  Onset of symptoms was gradual starting 7+ years ago with gradually worsening course since that time.The patient noted no past surgery on the left hip(s).  Patient currently rates pain in the left hip at 10 out of 10 with activity. Patient has night pain, worsening of pain with activity and weight bearing, trendelenberg gait, pain that interfers with activities of daily living and pain with passive range of motion. Patient has evidence of periarticular osteophytes and joint space narrowing by imaging studies. This condition presents safety issues increasing the risk of falls.   There is no current active infection.   Risks, benefits and expectations were discussed with the patient.  Risks including but not limited to the risk of anesthesia, blood clots, nerve damage, blood vessel damage, failure of the prosthesis, infection and up to and including death.  Patient understand the risks, benefits and expectations and wishes to proceed with surgery.   PCP: Shirline Frees, MD  D/C Plans:       Home  Post-op Meds:       No Rx given  Tranexamic Acid:      To be given - topically  (questionable previous PE)  Decadron:      Is to be given  FYI:     Xarelto then ASA (questionable previous PE)  Norco  DME: Set up  PT:  No PT   Patient Active Problem List   Diagnosis Date Noted  . Acute bronchitis 02/15/2016  . Bronchiectasis ([place]) 02/15/2016  . Cough 02/15/2016  . Bronchiectasis without acute exacerbation ([place]) 02/01/2016  . Dyspnea 01/11/2016  . Pulmonary nodule  11/18/2014  . Essential hypertension, benign 12/29/2013  . Unspecified hypothyroidism 12/29/2013   Past Medical History:  Diagnosis Date  . Elevated cholesterol   . H/O blood clots 1990    Past Surgical History:  Procedure Laterality Date  . KNEE SURGERY     cyst removed  . tonisllectomy    . VAGINAL HYSTERECTOMY     TVH    No prescriptions prior to admission.   Allergies  Allergen Reactions  . Levaquin [Levofloxacin In D5w] Other (See Comments)    NUMBNESS/REDNESS  . Ciprofloxacin Rash and Other (See Comments)    NUMBNESS/REDNESS/RASH    Social History  Substance Use Topics  . Smoking status: Former Smoker    Packs/day: 0.50    Years: 8.00    Types: Cigarettes    Quit date: 09/02/1973  . Smokeless tobacco: Never Used  . Alcohol use No    Family History  Problem Relation Age of Onset  . Diabetes Mother   . Asthma Mother      Review of Systems  Constitutional: Negative.   HENT: Negative.   Eyes: Negative.   Respiratory: Negative.   Cardiovascular: Negative.   Gastrointestinal: Negative.   Genitourinary: Positive for frequency.  Musculoskeletal: Positive for joint pain.  Skin: Negative.   Neurological: Negative.   Endo/Heme/Allergies: Negative.   Psychiatric/Behavioral: Negative.     Objective:  Physical Exam  Constitutional: She is oriented to person, place, and time. She appears well-developed.  HENT:  Head: Normocephalic.  Eyes: Pupils are equal, round, and reactive to light.  Neck: Neck supple. No JVD present. No tracheal deviation present. No thyromegaly present.  Cardiovascular: Normal rate, regular rhythm and intact distal pulses.   Respiratory: Effort normal and breath sounds normal. No respiratory distress. She has no wheezes.  GI: Soft. There is no tenderness. There is no guarding.  Musculoskeletal:       Left hip: She exhibits decreased range of motion, decreased strength, tenderness and bony tenderness. She exhibits no swelling, no  deformity and no laceration.  Lymphadenopathy:    She has no cervical adenopathy.  Neurological: She is alert and oriented to person, place, and time.  Skin: Skin is warm and dry.  Psychiatric: She has a normal mood and affect.      Labs:  Estimated body mass index is 22.1 kg/m as calculated from the following:   Height as of 12/27/16: 5\' 7"  (1.702 m).   Weight as of 12/27/16: 64 kg (141 lb 2 oz).   Imaging Review Plain radiographs demonstrate severe degenerative joint disease of the left hip(s). The bone quality appears to be good for age and reported activity level.  Assessment/Plan:  End stage arthritis, left hip(s)  The patient history, physical examination, clinical judgement of the provider and imaging studies are consistent with end stage degenerative joint disease of the left hip(s) and total hip arthroplasty is deemed medically necessary. The treatment options including medical management, injection therapy, arthroscopy and arthroplasty were discussed at length. The risks and benefits of total hip arthroplasty were presented and reviewed. The risks due to aseptic loosening, infection, stiffness, dislocation/subluxation,  thromboembolic complications and other imponderables were discussed.  The patient acknowledged the explanation, agreed to proceed with the plan and consent was signed. Patient is being admitted for inpatient treatment for surgery, pain control, PT, OT, prophylactic antibiotics, VTE prophylaxis, progressive ambulation and ADL's and discharge planning.The patient is planning to be discharged home.      [place] Lindsea Olivar   PA-C  12/29/2016, 10:57 AM

## 2016-12-31 ENCOUNTER — Inpatient Hospital Stay (HOSPITAL_COMMUNITY)
Admission: RE | Admit: 2016-12-31 | Discharge: 2017-01-01 | DRG: 470 | Disposition: A | Discharge status: Home / Self Care | Payer: Medicare Other | Source: Ambulatory Visit | Attending: Orthopedic Surgery | Admitting: Orthopedic Surgery

## 2016-12-31 ENCOUNTER — Inpatient Hospital Stay (HOSPITAL_COMMUNITY): Payer: Medicare Other | Admitting: Anesthesiology

## 2016-12-31 ENCOUNTER — Inpatient Hospital Stay (HOSPITAL_COMMUNITY): Payer: Medicare Other

## 2016-12-31 ENCOUNTER — Encounter (HOSPITAL_COMMUNITY)
Admission: RE | Disposition: A | Discharge status: Home / Self Care | Payer: Self-pay | Source: Ambulatory Visit | Attending: Orthopedic Surgery

## 2016-12-31 ENCOUNTER — Encounter (HOSPITAL_COMMUNITY): Payer: Self-pay | Admitting: *Deleted

## 2016-12-31 DIAGNOSIS — I10 Essential (primary) hypertension: Secondary | ICD-10-CM | POA: Diagnosis not present

## 2016-12-31 DIAGNOSIS — E039 Hypothyroidism, unspecified: Secondary | ICD-10-CM | POA: Diagnosis present

## 2016-12-31 DIAGNOSIS — M1612 Unilateral primary osteoarthritis, left hip: Principal | ICD-10-CM | POA: Diagnosis present

## 2016-12-31 DIAGNOSIS — Z86711 Personal history of pulmonary embolism: Secondary | ICD-10-CM

## 2016-12-31 DIAGNOSIS — Z79891 Long term (current) use of opiate analgesic: Secondary | ICD-10-CM | POA: Diagnosis not present

## 2016-12-31 DIAGNOSIS — Z96642 Presence of left artificial hip joint: Secondary | ICD-10-CM

## 2016-12-31 DIAGNOSIS — E7800 Pure hypercholesterolemia, unspecified: Secondary | ICD-10-CM | POA: Diagnosis present

## 2016-12-31 DIAGNOSIS — Z87891 Personal history of nicotine dependence: Secondary | ICD-10-CM | POA: Diagnosis not present

## 2016-12-31 DIAGNOSIS — Z79899 Other long term (current) drug therapy: Secondary | ICD-10-CM

## 2016-12-31 DIAGNOSIS — Z881 Allergy status to other antibiotic agents status: Secondary | ICD-10-CM | POA: Diagnosis not present

## 2016-12-31 DIAGNOSIS — Z96649 Presence of unspecified artificial hip joint: Secondary | ICD-10-CM

## 2016-12-31 HISTORY — PX: TOTAL HIP ARTHROPLASTY: SHX124

## 2016-12-31 LAB — TYPE AND SCREEN
ABO/RH(D): B NEG
Antibody Screen: NEGATIVE

## 2016-12-31 SURGERY — ARTHROPLASTY, HIP, TOTAL, ANTERIOR APPROACH
Anesthesia: Spinal | Site: Hip | Laterality: Left

## 2016-12-31 MED ORDER — DIPHENHYDRAMINE HCL 25 MG PO CAPS: 25.0000 mg | ORAL_CAPSULE | Freq: Four times a day (QID) | ORAL | Status: DC | PRN

## 2016-12-31 MED ORDER — ROSUVASTATIN CALCIUM 5 MG PO TABS
5.0000 mg | ORAL_TABLET | Freq: Every day | ORAL | Status: DC
  Administered 2016-12-31: 5 mg via ORAL
  Filled 2016-12-31: qty 1

## 2016-12-31 MED ORDER — POLYETHYLENE GLYCOL 3350 17 G PO PACK: 17.0000 g | PACK | Freq: Two times a day (BID) | ORAL | 0 refills | Status: DC

## 2016-12-31 MED ORDER — PHENYLEPHRINE 40 MCG/ML (10ML) SYRINGE FOR IV PUSH (FOR BLOOD PRESSURE SUPPORT)
PREFILLED_SYRINGE | INTRAVENOUS | Status: AC
  Filled 2016-12-31: qty 10

## 2016-12-31 MED ORDER — LEVOTHYROXINE SODIUM 75 MCG PO TABS
75.0000 ug | ORAL_TABLET | Freq: Every day | ORAL | Status: DC
  Administered 2017-01-01: 09:00:00 75 ug via ORAL
  Filled 2016-12-31: qty 1

## 2016-12-31 MED ORDER — ONDANSETRON HCL 4 MG PO TABS: 4.0000 mg | ORAL_TABLET | Freq: Four times a day (QID) | ORAL | Status: DC | PRN

## 2016-12-31 MED ORDER — HYDROCODONE-ACETAMINOPHEN 7.5-325 MG PO TABS
1.0000 | ORAL_TABLET | ORAL | Status: DC
  Administered 2016-12-31: 2 via ORAL
  Administered 2016-12-31: 1 via ORAL
  Administered 2016-12-31: 2 via ORAL
  Administered 2017-01-01: 04:00:00 1 via ORAL
  Filled 2016-12-31 (×3): qty 1
  Filled 2016-12-31: qty 2
  Filled 2016-12-31 (×2): qty 1

## 2016-12-31 MED ORDER — ONDANSETRON HCL 4 MG/2ML IJ SOLN
INTRAMUSCULAR | Status: DC | PRN
  Administered 2016-12-31: 4 mg via INTRAVENOUS

## 2016-12-31 MED ORDER — MENTHOL 3 MG MT LOZG: 1.0000 | LOZENGE | OROMUCOSAL | Status: DC | PRN

## 2016-12-31 MED ORDER — FENTANYL CITRATE (PF) 100 MCG/2ML IJ SOLN
INTRAMUSCULAR | Status: AC
  Filled 2016-12-31: qty 2

## 2016-12-31 MED ORDER — CELECOXIB 200 MG PO CAPS
200.0000 mg | ORAL_CAPSULE | Freq: Two times a day (BID) | ORAL | Status: DC
  Administered 2016-12-31 – 2017-01-01 (×2): 200 mg via ORAL
  Filled 2016-12-31 (×2): qty 1

## 2016-12-31 MED ORDER — LIDOCAINE 2% (20 MG/ML) 5 ML SYRINGE
INTRAMUSCULAR | Status: AC
  Filled 2016-12-31: qty 5

## 2016-12-31 MED ORDER — METOCLOPRAMIDE HCL 5 MG PO TABS: 5.0000 mg | ORAL_TABLET | Freq: Three times a day (TID) | ORAL | Status: DC | PRN

## 2016-12-31 MED ORDER — TRETINOIN 0.1 % EX CREA: 1.0000 "application " | TOPICAL_CREAM | Freq: Every day | CUTANEOUS | Status: DC

## 2016-12-31 MED ORDER — DEXAMETHASONE SODIUM PHOSPHATE 10 MG/ML IJ SOLN
INTRAMUSCULAR | Status: AC
  Filled 2016-12-31: qty 1

## 2016-12-31 MED ORDER — ONDANSETRON HCL 4 MG/2ML IJ SOLN: 4.0000 mg | Freq: Four times a day (QID) | INTRAMUSCULAR | Status: DC | PRN

## 2016-12-31 MED ORDER — CEFAZOLIN SODIUM-DEXTROSE 2-4 GM/100ML-% IV SOLN
INTRAVENOUS | Status: AC
Start: 2016-12-31 — End: 2016-12-31
  Filled 2016-12-31: qty 100

## 2016-12-31 MED ORDER — LIDOCAINE 2% (20 MG/ML) 5 ML SYRINGE
INTRAMUSCULAR | Status: DC | PRN
  Administered 2016-12-31: 40 mg via INTRAVENOUS

## 2016-12-31 MED ORDER — METHOCARBAMOL 500 MG PO TABS: 500.0000 mg | ORAL_TABLET | Freq: Four times a day (QID) | ORAL | 0 refills | Status: DC | PRN

## 2016-12-31 MED ORDER — OXYCODONE HCL 5 MG PO TABS: 5.0000 mg | ORAL_TABLET | Freq: Once | ORAL | Status: DC | PRN

## 2016-12-31 MED ORDER — RIVAROXABAN 10 MG PO TABS: 10.0000 mg | ORAL_TABLET | Freq: Every day | ORAL | 0 refills | Status: DC

## 2016-12-31 MED ORDER — DEXAMETHASONE SODIUM PHOSPHATE 10 MG/ML IJ SOLN
10.0000 mg | Freq: Once | INTRAMUSCULAR | Status: AC
  Administered 2017-01-01: 09:00:00 10 mg via INTRAVENOUS
  Filled 2016-12-31: qty 1

## 2016-12-31 MED ORDER — METOCLOPRAMIDE HCL 5 MG/ML IJ SOLN
5.0000 mg | Freq: Three times a day (TID) | INTRAMUSCULAR | Status: DC | PRN
  Administered 2017-01-01: 12:00:00 10 mg via INTRAVENOUS
  Filled 2016-12-31: qty 2

## 2016-12-31 MED ORDER — ASPIRIN 81 MG PO CHEW: 81.0000 mg | CHEWABLE_TABLET | Freq: Two times a day (BID) | ORAL | 0 refills | Status: DC

## 2016-12-31 MED ORDER — LACTATED RINGERS IV SOLN
INTRAVENOUS | Status: DC
  Administered 2016-12-31 (×3): via INTRAVENOUS

## 2016-12-31 MED ORDER — PROPOFOL 10 MG/ML IV BOLUS
INTRAVENOUS | Status: AC
  Filled 2016-12-31: qty 60

## 2016-12-31 MED ORDER — ONDANSETRON HCL 4 MG/2ML IJ SOLN
INTRAMUSCULAR | Status: AC
  Filled 2016-12-31: qty 2

## 2016-12-31 MED ORDER — RIVAROXABAN 10 MG PO TABS: 10.0000 mg | ORAL_TABLET | Freq: Every day | ORAL | Status: DC

## 2016-12-31 MED ORDER — DOCUSATE SODIUM 100 MG PO CAPS
100.0000 mg | ORAL_CAPSULE | Freq: Two times a day (BID) | ORAL | Status: DC
  Administered 2016-12-31 – 2017-01-01 (×2): 100 mg via ORAL
  Filled 2016-12-31 (×2): qty 1

## 2016-12-31 MED ORDER — HYDROMORPHONE HCL 1 MG/ML IJ SOLN
INTRAMUSCULAR | Status: AC
  Filled 2016-12-31: qty 1

## 2016-12-31 MED ORDER — PHENOL 1.4 % MT LIQD
1.0000 | OROMUCOSAL | Status: DC | PRN
  Filled 2016-12-31: qty 177

## 2016-12-31 MED ORDER — ONDANSETRON HCL 4 MG/2ML IJ SOLN
4.0000 mg | Freq: Four times a day (QID) | INTRAMUSCULAR | Status: DC | PRN
Start: 2016-12-31 — End: 2017-01-01
  Administered 2017-01-01: 09:00:00 4 mg via INTRAVENOUS
  Filled 2016-12-31: qty 2

## 2016-12-31 MED ORDER — HYDROMORPHONE HCL 1 MG/ML IJ SOLN
0.5000 mg | INTRAMUSCULAR | Status: DC | PRN
  Filled 2016-12-31 (×2): qty 1

## 2016-12-31 MED ORDER — ALUM & MAG HYDROXIDE-SIMETH 200-200-20 MG/5ML PO SUSP: 15.0000 mL | ORAL | Status: DC | PRN

## 2016-12-31 MED ORDER — POLYETHYLENE GLYCOL 3350 17 G PO PACK
17.0000 g | PACK | Freq: Two times a day (BID) | ORAL | Status: DC
  Administered 2016-12-31: 17 g via ORAL
  Filled 2016-12-31: qty 1

## 2016-12-31 MED ORDER — PROPOFOL 500 MG/50ML IV EMUL
INTRAVENOUS | Status: DC | PRN
  Administered 2016-12-31: 25 ug/kg/min via INTRAVENOUS

## 2016-12-31 MED ORDER — SODIUM CHLORIDE 0.9 % IV SOLN
2000.0000 mg | Freq: Once | INTRAVENOUS | Status: AC
  Administered 2016-12-31: 2000 mg via TOPICAL
  Filled 2016-12-31: qty 10

## 2016-12-31 MED ORDER — BISACODYL 10 MG RE SUPP: 10.0000 mg | Freq: Every day | RECTAL | Status: DC | PRN

## 2016-12-31 MED ORDER — CHLORHEXIDINE GLUCONATE 4 % EX LIQD: 60.0000 mL | Freq: Once | CUTANEOUS | Status: DC

## 2016-12-31 MED ORDER — HYDROCHLOROTHIAZIDE 25 MG PO TABS: 25.0000 mg | ORAL_TABLET | Freq: Every day | ORAL | Status: DC

## 2016-12-31 MED ORDER — MIDAZOLAM HCL 2 MG/2ML IJ SOLN
INTRAMUSCULAR | Status: AC
  Filled 2016-12-31: qty 2

## 2016-12-31 MED ORDER — EPHEDRINE 5 MG/ML INJ
INTRAVENOUS | Status: AC
  Filled 2016-12-31: qty 10

## 2016-12-31 MED ORDER — HYDROMORPHONE HCL 1 MG/ML IJ SOLN
0.2500 mg | INTRAMUSCULAR | Status: DC | PRN
  Administered 2016-12-31 (×4): 0.5 mg via INTRAVENOUS

## 2016-12-31 MED ORDER — PHENYLEPHRINE HCL 10 MG/ML IJ SOLN
INTRAMUSCULAR | Status: DC | PRN
  Administered 2016-12-31: 40 ug via INTRAVENOUS
  Administered 2016-12-31 (×4): 80 ug via INTRAVENOUS
  Administered 2016-12-31: 40 ug via INTRAVENOUS
  Administered 2016-12-31: 80 ug via INTRAVENOUS

## 2016-12-31 MED ORDER — MIDAZOLAM HCL 2 MG/2ML IJ SOLN
INTRAMUSCULAR | Status: DC | PRN
  Administered 2016-12-31 (×2): 1 mg via INTRAVENOUS

## 2016-12-31 MED ORDER — DOCUSATE SODIUM 100 MG PO CAPS: 100.0000 mg | ORAL_CAPSULE | Freq: Two times a day (BID) | ORAL | 0 refills | Status: DC

## 2016-12-31 MED ORDER — OXYCODONE HCL 5 MG/5ML PO SOLN
5.0000 mg | Freq: Once | ORAL | Status: DC | PRN
  Filled 2016-12-31: qty 5

## 2016-12-31 MED ORDER — FERROUS SULFATE 325 (65 FE) MG PO TABS
325.0000 mg | ORAL_TABLET | Freq: Three times a day (TID) | ORAL | Status: DC
Start: 2016-12-31 — End: 2017-12-15

## 2016-12-31 MED ORDER — MAGNESIUM CITRATE PO SOLN: 1.0000 | Freq: Once | ORAL | Status: DC | PRN

## 2016-12-31 MED ORDER — BUPIVACAINE IN DEXTROSE 0.75-8.25 % IT SOLN
INTRATHECAL | Status: DC | PRN
  Administered 2016-12-31: 2 mL via INTRATHECAL

## 2016-12-31 MED ORDER — CEFAZOLIN SODIUM-DEXTROSE 2-4 GM/100ML-% IV SOLN
2.0000 g | INTRAVENOUS | Status: AC
  Administered 2016-12-31: 2 g via INTRAVENOUS

## 2016-12-31 MED ORDER — ATENOLOL 25 MG PO TABS: 12.5000 mg | ORAL_TABLET | Freq: Every day | ORAL | Status: DC

## 2016-12-31 MED ORDER — EPHEDRINE SULFATE 50 MG/ML IJ SOLN
INTRAMUSCULAR | Status: DC | PRN
  Administered 2016-12-31 (×3): 10 mg via INTRAVENOUS

## 2016-12-31 MED ORDER — CEFAZOLIN SODIUM-DEXTROSE 2-4 GM/100ML-% IV SOLN
2.0000 g | Freq: Four times a day (QID) | INTRAVENOUS | Status: AC
  Administered 2016-12-31 (×2): 2 g via INTRAVENOUS
  Filled 2016-12-31 (×2): qty 100

## 2016-12-31 MED ORDER — HYDROCODONE-ACETAMINOPHEN 7.5-325 MG PO TABS: 1.0000 | ORAL_TABLET | ORAL | 0 refills | Status: DC | PRN

## 2016-12-31 MED ORDER — FENTANYL CITRATE (PF) 100 MCG/2ML IJ SOLN
INTRAMUSCULAR | Status: DC | PRN
  Administered 2016-12-31 (×2): 50 ug via INTRAVENOUS

## 2016-12-31 MED ORDER — METHOCARBAMOL 500 MG PO TABS
500.0000 mg | ORAL_TABLET | Freq: Four times a day (QID) | ORAL | Status: DC | PRN
  Administered 2016-12-31 – 2017-01-01 (×2): 500 mg via ORAL
  Filled 2016-12-31 (×2): qty 1

## 2016-12-31 MED ORDER — SODIUM CHLORIDE 0.9 % IV SOLN
100.0000 mL/h | INTRAVENOUS | Status: DC
  Administered 2016-12-31: 100 mL/h via INTRAVENOUS
  Filled 2016-12-31 (×3): qty 1000

## 2016-12-31 MED ORDER — DEXAMETHASONE SODIUM PHOSPHATE 10 MG/ML IJ SOLN
INTRAMUSCULAR | Status: DC | PRN
  Administered 2016-12-31: 10 mg via INTRAVENOUS

## 2016-12-31 MED ORDER — FERROUS SULFATE 325 (65 FE) MG PO TABS: 325.0000 mg | ORAL_TABLET | Freq: Three times a day (TID) | ORAL | Status: DC

## 2016-12-31 MED ORDER — METHOCARBAMOL 1000 MG/10ML IJ SOLN
500.0000 mg | Freq: Four times a day (QID) | INTRAMUSCULAR | Status: DC | PRN
  Administered 2016-12-31: 500 mg via INTRAVENOUS
  Filled 2016-12-31: qty 550

## 2016-12-31 SURGICAL SUPPLY — 39 items
BAG DECANTER FOR FLEXI CONT (MISCELLANEOUS) IMPLANT
BAG ZIPLOCK 12X15 (MISCELLANEOUS) IMPLANT
BLADE SAG 18X100X1.27 (BLADE) ×2 IMPLANT
CAPT HIP TOTAL 2 ×2 IMPLANT
CLOTH BEACON ORANGE TIMEOUT ST (SAFETY) ×2 IMPLANT
COVER PERINEAL POST (MISCELLANEOUS) ×2 IMPLANT
COVER SURGICAL LIGHT HANDLE (MISCELLANEOUS) ×2 IMPLANT
DERMABOND ADVANCED (GAUZE/BANDAGES/DRESSINGS) ×1
DERMABOND ADVANCED .7 DNX12 (GAUZE/BANDAGES/DRESSINGS) ×1 IMPLANT
DRAPE STERI IOBAN 125X83 (DRAPES) ×2 IMPLANT
DRAPE U-SHAPE 47X51 STRL (DRAPES) ×4 IMPLANT
DRESSING AQUACEL AG SP 3.5X10 (GAUZE/BANDAGES/DRESSINGS) ×1 IMPLANT
DRSG AQUACEL AG ADV 3.5X10 (GAUZE/BANDAGES/DRESSINGS) ×2 IMPLANT
DRSG AQUACEL AG SP 3.5X10 (GAUZE/BANDAGES/DRESSINGS) ×2
DURAPREP 26ML APPLICATOR (WOUND CARE) ×2 IMPLANT
ELECT REM PT RETURN 15FT ADLT (MISCELLANEOUS) ×2 IMPLANT
GLOVE BIOGEL M STRL SZ7.5 (GLOVE) IMPLANT
GLOVE BIOGEL PI IND STRL 7.5 (GLOVE) ×1 IMPLANT
GLOVE BIOGEL PI IND STRL 8.5 (GLOVE) ×1 IMPLANT
GLOVE BIOGEL PI INDICATOR 7.5 (GLOVE) ×1
GLOVE BIOGEL PI INDICATOR 8.5 (GLOVE) ×1
GLOVE ECLIPSE 8.0 STRL XLNG CF (GLOVE) ×4 IMPLANT
GLOVE ORTHO TXT STRL SZ7.5 (GLOVE) ×2 IMPLANT
GOWN STRL REUS W/TWL LRG LVL3 (GOWN DISPOSABLE) ×2 IMPLANT
GOWN STRL REUS W/TWL XL LVL3 (GOWN DISPOSABLE) ×2 IMPLANT
HOLDER FOLEY CATH W/STRAP (MISCELLANEOUS) ×2 IMPLANT
LIQUID BAND (GAUZE/BANDAGES/DRESSINGS) ×2 IMPLANT
NS IRRIG 1000ML POUR BTL (IV SOLUTION) ×2 IMPLANT
PACK ANTERIOR HIP CUSTOM (KITS) ×2 IMPLANT
SUT MNCRL AB 4-0 PS2 18 (SUTURE) ×2 IMPLANT
SUT STRATAFIX 0 PDS 27 VIOLET (SUTURE) ×2
SUT VIC AB 1 CT1 36 (SUTURE) ×6 IMPLANT
SUT VIC AB 2-0 CT1 27 (SUTURE) ×2
SUT VIC AB 2-0 CT1 TAPERPNT 27 (SUTURE) ×2 IMPLANT
SUTURE STRATFX 0 PDS 27 VIOLET (SUTURE) ×1 IMPLANT
TRAY FOLEY W/METER SILVER 14FR (SET/KITS/TRAYS/PACK) ×2 IMPLANT
TRAY FOLEY W/METER SILVER 16FR (SET/KITS/TRAYS/PACK) IMPLANT
WATER STERILE IRR 1500ML POUR (IV SOLUTION) ×4 IMPLANT
YANKAUER SUCT BULB TIP 10FT TU (MISCELLANEOUS) IMPLANT

## 2016-12-31 NOTE — Anesthesia Procedure Notes (Signed)
Procedure Name: MAC Date/Time: 12/31/2016 12:30 PM Performed by: Dione Booze Pre-anesthesia Checklist: Patient identified, Suction available, Patient being monitored and Emergency Drugs available Patient Re-evaluated:Patient Re-evaluated prior to inductionOxygen Delivery Method: Simple face mask Placement Confirmation: positive ETCO2

## 2016-12-31 NOTE — Transfer of Care (Signed)
Immediate Anesthesia Transfer of Care Note  Patient: Sandra Ellis  Procedure(s) Performed: Procedure(s): LEFT TOTAL HIP ARTHROPLASTY ANTERIOR APPROACH (Left)  Patient Location: PACU  Anesthesia Type:MAC and Spinal  Level of Consciousness: awake, alert , oriented and patient cooperative  Airway & Oxygen Therapy: Patient Spontanous Breathing and Patient connected to face mask oxygen  Post-op Assessment: Report given to RN and Post -op Vital signs reviewed and stable  Post vital signs: Reviewed and stable  Last Vitals:  Vitals:   12/31/16 0855 12/31/16 1417  BP: 140/72 (!) 88/39  Pulse: (!) 58 80  Resp: 18   Temp: 36.6 C     Last Pain:  Vitals:   12/31/16 0918  TempSrc:   PainSc: 0-No pain      Patients Stated Pain Goal: 3 (26/20/35 5974)  Complications: No apparent anesthesia complications

## 2016-12-31 NOTE — Interval H&P Note (Signed)
History and Physical Interval Note:  12/31/2016 11:20 AM  [name]  has presented today for surgery, with the diagnosis of left hip osteoarthritis  The various methods of treatment have been discussed with the patient and family. After consideration of risks, benefits and other options for treatment, the patient has consented to  Procedure(s): LEFT TOTAL HIP ARTHROPLASTY ANTERIOR APPROACH (Left) as a surgical intervention .  The patient's history has been reviewed, patient examined, no change in status, stable for surgery.  I have reviewed the patient's chart and labs.  Questions were answered to the patient's satisfaction.     Mauri Pole

## 2016-12-31 NOTE — Anesthesia Preprocedure Evaluation (Addendum)
Anesthesia Evaluation  Patient identified by MRN, date of birth, ID band Patient awake    Reviewed: Allergy & Precautions, NPO status , Patient's Chart, lab work & pertinent test results, reviewed documented beta blocker date and time   Airway Mallampati: I  TM Distance: >3 FB Neck ROM: Full    Dental  (+) Teeth Intact, Dental Advisory Given   Pulmonary former smoker,    breath sounds clear to auscultation       Cardiovascular hypertension, Pt. on medications and Pt. on home beta blockers  Rhythm:Regular Rate:Normal     Neuro/Psych negative neurological ROS  negative psych ROS   GI/Hepatic negative GI ROS, Neg liver ROS,   Endo/Other  Hypothyroidism   Renal/GU negative Renal ROS  negative genitourinary   Musculoskeletal negative musculoskeletal ROS (+)   Abdominal   Peds negative pediatric ROS (+)  Hematology negative hematology ROS (+)   Anesthesia Other Findings   Reproductive/Obstetrics negative OB ROS                           Lab Results  Component Value Date   WBC 7.2 12/27/2016   HGB 14.2 12/27/2016   HCT 40.9 12/27/2016   MCV 90.5 12/27/2016   PLT 273 12/27/2016     Anesthesia Physical Anesthesia Plan  ASA: II  Anesthesia Plan: Spinal   Post-op Pain Management:    Induction: Intravenous  Airway Management Planned: Natural Airway  Additional Equipment:   Intra-op Plan:   Post-operative Plan:   Informed Consent: I have reviewed the patients History and Physical, chart, labs and discussed the procedure including the risks, benefits and alternatives for the proposed anesthesia with the patient or authorized representative who has indicated his/her understanding and acceptance.     Plan Discussed with: CRNA  Anesthesia Plan Comments: (Xarelto - Not taking at present time. )       Anesthesia Quick Evaluation

## 2016-12-31 NOTE — Op Note (Signed)
NAME:  Sandra Ellis                ACCOUNT NO.: [id number]      MEDICAL RECORD NO.: [id number]      FACILITY:  [facility]      PHYSICIAN:  Paralee Cancel D  DATE OF BIRTH:  1946/08/15     DATE OF PROCEDURE:  12/31/2016                                 OPERATIVE REPORT         PREOPERATIVE DIAGNOSIS: Left  hip osteoarthritis.      POSTOPERATIVE DIAGNOSIS:  Left hip osteoarthritis.      PROCEDURE:  Left total hip replacement through an anterior approach   utilizing DePuy THR system, component size 35mm pinnacle cup, a size 36+4 neutral   Altrex liner, a size 6 Hi Tri Lock stem with a 36+1.5 delta ceramic   ball.      SURGEON:  Pietro Cassis. Alvan Dame, M.D.      ASSISTANT:  Nehemiah Massed, PA-C     ANESTHESIA:  Spinal.      SPECIMENS:  None.      COMPLICATIONS:  None.      BLOOD LOSS:  250 cc     DRAINS:  None.      INDICATION OF THE PROCEDURE:  Jaria Conway is a 71 y.o. female who had   presented to office for evaluation of left hip pain.  Radiographs revealed   progressive degenerative changes with bone-on-bone   articulation to the  hip joint.  The patient had painful limited range of   motion significantly affecting their overall quality of life.  The patient was failing to    respond to conservative measures, and at this point was ready   to proceed with more definitive measures.  The patient has noted progressive   degenerative changes in his hip, progressive problems and dysfunction   with regarding the hip prior to surgery.  Consent was obtained for   benefit of pain relief.  Specific risk of infection, DVT, component   failure, dislocation, need for revision surgery, as well discussion of   the anterior versus posterior approach were reviewed.  Consent was   obtained for benefit of anterior pain relief through an anterior   approach.      PROCEDURE IN DETAIL:  The patient was brought to operative theater.   Once adequate anesthesia, preoperative  antibiotics, 2gm of Ancef and 10 mg of Decadron administered.   The patient was positioned supine on the OSI Hanna table.  Once adequate   padding of boney process was carried out, we had predraped out the hip, and  used fluoroscopy to confirm orientation of the pelvis and position.      The left hip was then prepped and draped from proximal iliac crest to   mid thigh with shower curtain technique.      Time-out was performed identifying the patient, planned procedure, and   extremity.     An incision was then made 2 cm distal and lateral to the   anterior superior iliac spine extending over the orientation of the   tensor fascia lata muscle and sharp dissection was carried down to the   fascia of the muscle and protractor placed in the soft tissues.      The fascia was then incised.  The muscle belly was  identified and swept   laterally and retractor placed along the superior neck.  Following   cauterization of the circumflex vessels and removing some pericapsular   fat, a second cobra retractor was placed on the inferior neck.  A third   retractor was placed on the anterior acetabulum after elevating the   anterior rectus.  A L-capsulotomy was along the line of the   superior neck to the trochanteric fossa, then extended proximally and   distally.  Tag sutures were placed and the retractors were then placed   intracapsular.  We then identified the trochanteric fossa and   orientation of my neck cut, confirmed this radiographically   and then made a neck osteotomy with the femur on traction.  The femoral   head was removed without difficulty or complication.  Traction was let   off and retractors were placed posterior and anterior around the   acetabulum.      The labrum and foveal tissue were debrided.  I began reaming with a 67mm   reamer and reamed up to 51 mm reamer with good bony bed preparation and a 46mm   cup was chosen.  The final 85mm Pinnacle cup was then impacted under  fluoroscopy  to confirm the depth of penetration and orientation with respect to   abduction.  A screw was placed followed by the hole eliminator.  The final   36+4 neutral Altrex liner was impacted with good visualized rim fit.  The cup was positioned anatomically within the acetabular portion of the pelvis.      At this point, the femur was rolled at 80 degrees.  Further capsule was   released off the inferior aspect of the femoral neck.  I then   released the superior capsule proximally.  The hook was placed laterally   along the femur and elevated manually and held in position with the bed   hook.  The leg was then extended and adducted with the leg rolled to 100   degrees of external rotation.  Once the proximal femur was fully   exposed, I used a box osteotome to set orientation.  I then began   broaching with the starting chili pepper broach and passed this by hand and then broached up to 6.  With the 6 broach in place I chose a high offset neck and did several trial reductions.  The offset was appropriate, leg lengths   appeared to be equal best matched with the +1.5 head ball, confirmed radiographically.  Initial trial with a 4 broach indicated some length and that the stem was small.  So, when I revisited the femur prep I re-broached up to the 6 but also sat it about 4 mm further into femur to accommodate radiographic determinations.   Given these findings, I went ahead and dislocated the hip, repositioned all   retractors and positioned the right hip in the extended and abducted position.  The final 6 Hi Tri Lock stem was   chosen and it was impacted down to the level of neck cut.  Based on this   and the trial reduction, a 36+1.5 delta ceramic ball was chosen and   impacted onto a clean and dry trunnion, and the hip was reduced.  The   hip had been irrigated throughout the case again at this point.  I did   reapproximate the superior capsular leaflet to the anterior leaflet   using  #1 Vicryl.  The fascia of the  tensor fascia lata muscle was then reapproximated using #1 Vicryl and #0 Stratafix sutures.  The   remaining wound was closed with 2-0 Vicryl and running 4-0 Monocryl.   The hip was cleaned, dried, and dressed sterilely using Dermabond and   Aquacel dressing.  She was then brought   to recovery room in stable condition tolerating the procedure well.    Nehemiah Massed, PA-C was present for the entirety of the case involved from   preoperative positioning, perioperative retractor management, general   facilitation of the case, as well as primary wound closure as assistant.            Pietro Cassis Alvan Dame, M.D.        12/31/2016 1:47 PM

## 2016-12-31 NOTE — Anesthesia Procedure Notes (Signed)
Spinal  Patient location during procedure: OR Start time: 12/31/2016 12:25 PM End time: 12/31/2016 12:27 PM Staffing Anesthesiologist: Suella Broad D Performed: anesthesiologist  Preanesthetic Checklist Completed: patient identified, site marked, surgical consent, pre-op evaluation, timeout performed, IV checked, risks and benefits discussed and monitors and equipment checked Spinal Block Patient position: sitting Prep: Betadine Patient monitoring: heart rate, continuous pulse ox, blood pressure and cardiac monitor Approach: midline Location: L4-5 Injection technique: single-shot Needle Needle type: Whitacre and Introducer  Needle gauge: 24 G Needle length: 9 cm Additional Notes Negative paresthesia. Negative blood return. Positive free-flowing CSF. Expiration date of kit checked and confirmed. Patient tolerated procedure well, without complications.

## 2016-12-31 NOTE — Discharge Instructions (Addendum)

## 2017-01-01 ENCOUNTER — Encounter (HOSPITAL_COMMUNITY): Payer: Self-pay | Admitting: Orthopedic Surgery

## 2017-01-01 LAB — BASIC METABOLIC PANEL
Anion gap: 8 (ref 5–15)
BUN: 11 mg/dL (ref 6–20)
CO2: 26 mmol/L (ref 22–32)
Calcium: 8.8 mg/dL — ABNORMAL LOW (ref 8.9–10.3)
Chloride: 99 mmol/L — ABNORMAL LOW (ref 101–111)
Creatinine, Ser: 0.71 mg/dL (ref 0.44–1.00)
GFR calc Af Amer: >60 mL/min (ref >60)
GFR calc non Af Amer: >60 mL/min (ref >60)
Glucose, Bld: 176 mg/dL — ABNORMAL HIGH (ref 65–99)
Potassium: 3.7 mmol/L (ref 3.5–5.1)
Sodium: 133 mmol/L — ABNORMAL LOW (ref 135–145)

## 2017-01-01 LAB — CBC
HCT: 34.4 % — ABNORMAL LOW (ref 36.0–46.0)
Hemoglobin: 11.8 g/dL — ABNORMAL LOW (ref 12.0–15.0)
MCH: 31.1 pg (ref 26.0–34.0)
MCHC: 34.3 g/dL (ref 30.0–36.0)
MCV: 90.8 fL (ref 78.0–100.0)
Platelets: 246 10*3/uL (ref 150–400)
RBC: 3.79 MIL/uL — ABNORMAL LOW (ref 3.87–5.11)
RDW: 13.2 % (ref 11.5–15.5)
WBC: 15.4 10*3/uL — ABNORMAL HIGH (ref 4.0–10.5)

## 2017-01-01 MED ORDER — ASPIRIN 81 MG PO CHEW: 162.0000 mg | CHEWABLE_TABLET | Freq: Two times a day (BID) | ORAL | 0 refills | Status: AC

## 2017-01-01 MED ORDER — ASPIRIN 81 MG PO CHEW
162.0000 mg | CHEWABLE_TABLET | Freq: Two times a day (BID) | ORAL | Status: DC
  Administered 2017-01-01: 09:00:00 162 mg via ORAL
  Filled 2017-01-01: qty 2

## 2017-01-01 NOTE — Evaluation (Signed)
Occupational Therapy Evaluation Patient Details Name: Sandra Ellis MRN: [id number] DOB: July 10, 1946 Today's Date: 01/01/2017    History of Present Illness Pt s/p L THR   Clinical Impression   Pt is a 71 y/o F who presents with the above. Pt currently requires MinGuard to supervision for majority of her ADLs and functional mobility, with increased assist for LB dressing and LB bathing. Pt will have 24 hour assist from family upon return home for completing ADLs PRN. Education provided and questions answered throughout session. Pt reports feeling comfortable with completing ADLs and functional mobility upon return home and with family assist. No further acute OT needs identified at this time. Will sign off.     Follow Up Recommendations  No OT follow up;Supervision/Assistance - 24 hour (initially )    Equipment Recommendations  3 in 1 bedside commode (3:1 delivered to room prior to session)           Precautions / Restrictions Precautions Precautions: Fall Restrictions Weight Bearing Restrictions: No Other Position/Activity Restrictions: WBAT      Mobility Bed Mobility Overal bed mobility: Needs Assistance Bed Mobility: Supine to Sit     Supine to sit: Min guard     General bed mobility comments: OOB with PT  Transfers Overall transfer level: Needs assistance Equipment used: Rolling walker (2 wheeled) Transfers: Sit to/from Stand Sit to Stand: Min guard         General transfer comment: for safety     Balance Overall balance assessment: No apparent balance deficits (not formally assessed)                                         ADL either performed or assessed with clinical judgement   ADL Overall ADL's : Needs assistance/impaired Eating/Feeding: Independent;Sitting   Grooming: Oral care;Min guard;Standing   Upper Body Bathing: Min guard;Sitting   Lower Body Bathing: Min guard;Sit to/from stand   Upper Body Dressing : Set  up;Sitting   Lower Body Dressing: Minimal assistance;Sit to/from stand   Toilet Transfer: Min guard;Cueing for safety;Ambulation;BSC;RW Toilet Transfer Details (indicate cue type and reason): BSC over toilet  Toileting- Clothing Manipulation and Hygiene: Min guard;Sit to/from stand   Tub/ Shower Transfer: Walk-in shower;Cueing for sequencing;Ambulation;Rolling walker   Functional mobility during ADLs: Min guard;Rolling walker General ADL Comments: Pt completed room and hallway level functional mobility, standing grooming ADLs, toilet and shower transfer during session, Pt with BP running low, reporting she feels better when up and moving, s/s monitored throughout session                          Pertinent Vitals/Pain Pain Assessment: Faces Pain Score: 2  Faces Pain Scale: Hurts a little bit Pain Location: L hip Pain Descriptors / Indicators: Aching;Sore Pain Intervention(s): Limited activity within patient's tolerance;Monitored during session;Ice applied     Extremity/Trunk Assessment Upper Extremity Assessment Upper Extremity Assessment: Overall WFL for tasks assessed   Lower Extremity Assessment Lower Extremity Assessment: LLE deficits/detail LLE Deficits / Details: Strength at hip 2+/5 with AAROM at hip to 90 flex and 20 abd   Cervical / Trunk Assessment Cervical / Trunk Assessment: Normal   Communication Communication Communication: No difficulties   Cognition Arousal/Alertness: Awake/alert Behavior During Therapy: WFL for tasks assessed/performed Overall Cognitive Status: Within Functional Limits for tasks assessed  General Comments                Home Living Family/patient expects to be discharged to:: Private residence Living Arrangements: Spouse/significant other Available Help at Discharge: Family Type of Home: House Home Access: Stairs to enter Technical brewer of Steps: 2 Entrance  Stairs-Rails: None Home Layout: One level     Bathroom Shower/Tub: Occupational psychologist: [place]: Environmental consultant - 2 wheels;Cane - single point;Shower seat - built in          Prior Functioning/Environment Level of Independence: Independent                 OT Problem List: Decreased strength;Decreased activity tolerance            OT Goals(Current goals can be found in the care plan section) Acute Rehab OT Goals Patient Stated Goal: Regain IND; get outside to walk, back to being active  OT Goal Formulation: With patient                                 AM-PAC PT "6 Clicks" Daily Activity     Outcome Measure Help from another person eating meals?: None Help from another person taking care of personal grooming?: A Little Help from another person toileting, which includes using toliet, bedpan, or urinal?: A Little Help from another person bathing (including washing, rinsing, drying)?: A Little Help from another person to put on and taking off regular upper body clothing?: A Little Help from another person to put on and taking off regular lower body clothing?: A Little 6 Click Score: 19   End of Session Equipment Utilized During Treatment: Gait belt;Rolling walker Nurse Communication: Mobility status  Activity Tolerance: Patient tolerated treatment well Patient left: in chair;with call bell/phone within reach;with nursing/sitter in room;with family/visitor present  OT Visit Diagnosis: Muscle weakness (generalized) (M62.81)                Time: 6950-7225 OT Time Calculation (min): 32 min Charges:  OT General Charges $OT Visit: 1 Procedure OT Evaluation $OT Eval Low Complexity: 1 Procedure OT Treatments $Self Care/Home Management : 8-22 mins G-Codes:     Lou Cal, OT Pager [phone] 01/01/2017   Raymondo Band 01/01/2017, 12:42 PM

## 2017-01-01 NOTE — Evaluation (Signed)
Physical Therapy Evaluation Patient Details Name: Sandra Ellis MRN: [id number] DOB: 26-Jun-1946 Today's Date: 01/01/2017   History of Present Illness  Pt s/p L THR  Clinical Impression  Pt s/p L THR and presents with decreased L LE strength/ROM and post op pain limiting functional mobility.  Pt should progress to dc home with assist of family.    Follow Up Recommendations No PT follow up (per physician)    Equipment Recommendations  None recommended by PT    Recommendations for Other Services OT consult     Precautions / Restrictions Precautions Precautions: Fall Restrictions Weight Bearing Restrictions: No Other Position/Activity Restrictions: WBAT      Mobility  Bed Mobility Overal bed mobility: Needs Assistance Bed Mobility: Supine to Sit     Supine to sit: Min guard     General bed mobility comments: cues for sequence and use of R LE to self assist  Transfers Overall transfer level: Needs assistance Equipment used: Rolling walker (2 wheeled) Transfers: Sit to/from Stand Sit to Stand: Min assist         General transfer comment: cues for LE management and use of UEs to self assist  Ambulation/Gait Ambulation/Gait assistance: Min assist;Min guard Ambulation Distance (Feet): 159 Feet Assistive device: Rolling walker (2 wheeled) Gait Pattern/deviations: Step-to pattern;Step-through pattern;Decreased step length - right;Decreased step length - left;Shuffle;Trunk flexed Gait velocity: decr Gait velocity interpretation: Below normal speed for age/gender General Gait Details: cues for posture, position from RW and initial sequence  Stairs            Wheelchair Mobility    Modified Rankin (Stroke Patients Only)       Balance Overall balance assessment: No apparent balance deficits (not formally assessed)                                           Pertinent Vitals/Pain Pain Assessment: 0-10 Pain Score: 2  Pain Location: L  hip Pain Descriptors / Indicators: Aching;Sore Pain Intervention(s): Limited activity within patient's tolerance;Monitored during session;Ice applied    Home Living Family/patient expects to be discharged to:: Private residence Living Arrangements: Spouse/significant other Available Help at Discharge: Family Type of Home: House Home Access: Stairs to enter Entrance Stairs-Rails: None Entrance Stairs-Number of Steps: 2 Home Layout: One level Home Equipment: Environmental consultant - 2 wheels;Cane - single point      Prior Function Level of Independence: Independent               Hand Dominance        Extremity/Trunk Assessment   Upper Extremity Assessment Upper Extremity Assessment: Overall WFL for tasks assessed    Lower Extremity Assessment Lower Extremity Assessment: LLE deficits/detail LLE Deficits / Details: Strength at hip 2+/5 with AAROM at hip to 90 flex and 20 abd    Cervical / Trunk Assessment Cervical / Trunk Assessment: Normal  Communication   Communication: No difficulties  Cognition Arousal/Alertness: Awake/alert Behavior During Therapy: WFL for tasks assessed/performed Overall Cognitive Status: Within Functional Limits for tasks assessed                                        General Comments      Exercises Total Joint Exercises Ankle Circles/Pumps: AROM;Both;15 reps;Supine Quad Sets: AROM;Both;10 reps;Supine Heel Slides: AAROM;Left;20 reps;Supine Hip ABduction/ADduction: AAROM;Left;15  reps;Supine   Assessment/Plan    PT Assessment Patient needs continued PT services  PT Problem List Decreased strength;Decreased range of motion;Decreased activity tolerance;Decreased mobility;Decreased knowledge of use of DME;Pain       PT Treatment Interventions DME instruction;Gait training;Stair training;Functional mobility training;Therapeutic activities;Therapeutic exercise;Patient/family education    PT Goals (Current goals can be found in the  Care Plan section)  Acute Rehab PT Goals Patient Stated Goal: Regain IND PT Goal Formulation: With patient Time For Goal Achievement: 01/02/17 Potential to Achieve Goals: Good    Frequency 7X/week   Barriers to discharge        Co-evaluation               AM-PAC PT "6 Clicks" Daily Activity  Outcome Measure Difficulty turning over in bed (including adjusting bedclothes, sheets and blankets)?: A Little Difficulty moving from lying on back to sitting on the side of the bed? : A Little Difficulty sitting down on and standing up from a chair with arms (e.g., wheelchair, bedside commode, etc,.)?: A Little Help needed moving to and from a bed to chair (including a wheelchair)?: A Little Help needed walking in hospital room?: A Little Help needed climbing 3-5 steps with a railing? : A Little 6 Click Score: 18    End of Session Equipment Utilized During Treatment: Gait belt Activity Tolerance: Patient tolerated treatment well Patient left: in chair;with call bell/phone within reach;with family/visitor present Nurse Communication: Mobility status PT Visit Diagnosis: Difficulty in walking, not elsewhere classified (R26.2)    Time: 3817-7116 PT Time Calculation (min) (ACUTE ONLY): 39 min   Charges:   PT Evaluation $PT Eval Low Complexity: 1 Procedure PT Treatments $Gait Training: 8-22 mins $Therapeutic Exercise: 8-22 mins   PT G Codes:        Pg [phone]   Bransen Fassnacht 01/01/2017, 11:06 AM

## 2017-01-01 NOTE — Progress Notes (Signed)
Discharge plan:  Pt in need of RW and 3in1 for home. She states she went to the [facility] retail store with script and it was never delivered. [facility] rep alerted of situation and RW, 3in1 to be delivered to room. No other CM needs communicated. Marney Doctor RN,BSN,NCM [phone]

## 2017-01-01 NOTE — Progress Notes (Signed)
Physical Therapy Treatment Patient Details Name: Sandra Ellis MRN: [id number] DOB: 1946-03-12 Today's Date: 01/01/2017    History of Present Illness Pt s/p L THR    PT Comments    Pt performed therex program with assist and reviewed progression of therex with written instructions provided.   Follow Up Recommendations  No PT follow up     Equipment Recommendations  None recommended by PT    Recommendations for Other Services OT consult     Precautions / Restrictions Precautions Precautions: Fall Restrictions Weight Bearing Restrictions: No Other Position/Activity Restrictions: WBAT    Mobility  Bed Mobility Overal bed mobility: Needs Assistance Bed Mobility: Supine to Sit     Supine to sit: Min guard     General bed mobility comments: min cues for sequence  Transfers Overall transfer level: Needs assistance Equipment used: Rolling walker (2 wheeled) Transfers: Sit to/from Stand Sit to Stand: Supervision         General transfer comment: cues for use of UEs to self assist  Ambulation/Gait Ambulation/Gait assistance: Min guard;Supervision Ambulation Distance (Feet): 222 Feet (and 15' into bathroom) Assistive device: Rolling walker (2 wheeled) Gait Pattern/deviations: Step-to pattern;Step-through pattern;Decreased step length - right;Decreased step length - left;Shuffle;Trunk flexed Gait velocity: decr Gait velocity interpretation: Below normal speed for age/gender General Gait Details: cues for posture, position from RW and initial sequence   Stairs Stairs: Yes   Stair Management: No rails;With walker Number of Stairs: 4 General stair comments: 2 steps twice with doorframe and RW up stairs and RW down.  Cues for sequence and foot/RW placement.  Spouse present.  Wheelchair Mobility    Modified Rankin (Stroke Patients Only)       Balance Overall balance assessment: No apparent balance deficits (not formally assessed)                                           Cognition Arousal/Alertness: Awake/alert Behavior During Therapy: WFL for tasks assessed/performed Overall Cognitive Status: Within Functional Limits for tasks assessed                                        Exercises Total Joint Exercises Ankle Circles/Pumps: AROM;Both;15 reps;Supine Quad Sets: AROM;Both;10 reps;Supine Heel Slides: AAROM;Left;20 reps;Supine Hip ABduction/ADduction: AAROM;Left;15 reps;Supine Long Arc Quad: 10 reps;Seated;AROM;Left    General Comments        Pertinent Vitals/Pain Pain Assessment: 0-10 Pain Score: 2  Faces Pain Scale: Hurts a little bit Pain Location: L hip Pain Descriptors / Indicators: Aching;Sore Pain Intervention(s): Limited activity within patient's tolerance    Home Living Family/patient expects to be discharged to:: Private residence Living Arrangements: Spouse/significant other Available Help at Discharge: Family Type of Home: House Home Access: Stairs to enter   Home Layout: One level Home Equipment: Environmental consultant - 2 wheels;Cane - single point;Shower seat - built in      Prior Function Level of Independence: Independent          PT Goals (current goals can now be found in the care plan section) Acute Rehab PT Goals Patient Stated Goal: Regain IND; get outside to walk, back to being active  PT Goal Formulation: With patient Time For Goal Achievement: 01/02/17 Potential to Achieve Goals: Good Progress towards PT goals: Progressing toward goals    Frequency  7X/week      PT Plan Current plan remains appropriate    Co-evaluation              AM-PAC PT "6 Clicks" Daily Activity  Outcome Measure  Difficulty turning over in bed (including adjusting bedclothes, sheets and blankets)?: A Little Difficulty moving from lying on back to sitting on the side of the bed? : A Little Difficulty sitting down on and standing up from a chair with arms (e.g., wheelchair, bedside  commode, etc,.)?: A Little Help needed moving to and from a bed to chair (including a wheelchair)?: A Little Help needed walking in hospital room?: A Little Help needed climbing 3-5 steps with a railing? : A Little 6 Click Score: 18    End of Session Equipment Utilized During Treatment: Gait belt Activity Tolerance: Patient tolerated treatment well Patient left: in chair;with call bell/phone within reach;with family/visitor present Nurse Communication: Mobility status PT Visit Diagnosis: Difficulty in walking, not elsewhere classified (R26.2)     Time: 1751-0258 PT Time Calculation (min) (ACUTE ONLY): 19 min  Charges:  $Gait Training: 8-22 mins $Therapeutic Exercise: 8-22 mins $Therapeutic Activity: 8-22 mins                    G Codes:       Pg [phone]    Jasmine Mcbeth 01/01/2017, 3:20 PM

## 2017-01-01 NOTE — Progress Notes (Signed)
Physical Therapy Treatment Patient Details Name: Sandra Ellis MRN: [id number] DOB: 03-25-1946 Today's Date: 01/01/2017    History of Present Illness Pt s/p L THR    PT Comments    Pt progressing well with mobility.  Reviewed stairs and car transfers with pt and spouse.   Follow Up Recommendations  No PT follow up     Equipment Recommendations  None recommended by PT    Recommendations for Other Services OT consult     Precautions / Restrictions Precautions Precautions: Fall Restrictions Weight Bearing Restrictions: No Other Position/Activity Restrictions: WBAT    Mobility  Bed Mobility Overal bed mobility: Needs Assistance Bed Mobility: Supine to Sit     Supine to sit: Min guard     General bed mobility comments: min cues for sequence  Transfers Overall transfer level: Needs assistance Equipment used: Rolling walker (2 wheeled) Transfers: Sit to/from Stand Sit to Stand: Supervision         General transfer comment: cues for use of UEs to self assist  Ambulation/Gait Ambulation/Gait assistance: Min guard;Supervision Ambulation Distance (Feet): 222 Feet (and 15' into bathroom) Assistive device: Rolling walker (2 wheeled) Gait Pattern/deviations: Step-to pattern;Step-through pattern;Decreased step length - right;Decreased step length - left;Shuffle;Trunk flexed Gait velocity: decr Gait velocity interpretation: Below normal speed for age/gender General Gait Details: cues for posture, position from RW and initial sequence   Stairs Stairs: Yes   Stair Management: No rails;With walker Number of Stairs: 4 General stair comments: 2 steps twice with doorframe and RW up stairs and RW down.  Cues for sequence and foot/RW placement.  Spouse present.  Wheelchair Mobility    Modified Rankin (Stroke Patients Only)       Balance Overall balance assessment: No apparent balance deficits (not formally assessed)                                           Cognition Arousal/Alertness: Awake/alert Behavior During Therapy: WFL for tasks assessed/performed Overall Cognitive Status: Within Functional Limits for tasks assessed                                        Exercises      General Comments        Pertinent Vitals/Pain Pain Assessment: 0-10 Pain Score: 2  Faces Pain Scale: Hurts a little bit Pain Location: L hip Pain Descriptors / Indicators: Aching;Sore Pain Intervention(s): Limited activity within patient's tolerance;Monitored during session    [place] expects to be discharged to:: Private residence Living Arrangements: Spouse/significant other Available Help at Discharge: Family Type of Home: House Home Access: Stairs to enter   Home Layout: One level Home Equipment: Environmental consultant - 2 wheels;Cane - single point;Shower seat - built in      Prior Function Level of Independence: Independent          PT Goals (current goals can now be found in the care plan section) Acute Rehab PT Goals Patient Stated Goal: Regain IND; get outside to walk, back to being active  PT Goal Formulation: With patient Time For Goal Achievement: 01/02/17 Potential to Achieve Goals: Good Progress towards PT goals: Progressing toward goals    Frequency    7X/week      PT Plan Current plan remains appropriate    Co-evaluation  AM-PAC PT "6 Clicks" Daily Activity  Outcome Measure  Difficulty turning over in bed (including adjusting bedclothes, sheets and blankets)?: A Little Difficulty moving from lying on back to sitting on the side of the bed? : A Little Difficulty sitting down on and standing up from a chair with arms (e.g., wheelchair, bedside commode, etc,.)?: A Little Help needed moving to and from a bed to chair (including a wheelchair)?: A Little Help needed walking in hospital room?: A Little Help needed climbing 3-5 steps with a railing? : A Little 6 Click  Score: 18    End of Session Equipment Utilized During Treatment: Gait belt Activity Tolerance: Patient tolerated treatment well Patient left: in chair;with call bell/phone within reach;with family/visitor present Nurse Communication: Mobility status PT Visit Diagnosis: Difficulty in walking, not elsewhere classified (R26.2)     Time: 9037-9558 PT Time Calculation (min) (ACUTE ONLY): 26 min  Charges:  $Gait Training: 8-22 mins $Therapeutic Activity: 8-22 mins                    G Codes:       Pg [phone]    Jeanita Carneiro 01/01/2017, 3:14 PM

## 2017-01-01 NOTE — Anesthesia Postprocedure Evaluation (Addendum)
Anesthesia Post Note  Patient: Crystalmarie Yasin  Procedure(s) Performed: Procedure(s) (LRB): LEFT TOTAL HIP ARTHROPLASTY ANTERIOR APPROACH (Left)  Patient location during evaluation: PACU Anesthesia Type: Spinal Level of consciousness: oriented and awake and alert Pain management: pain level controlled Vital Signs Assessment: post-procedure vital signs reviewed and stable Respiratory status: spontaneous breathing, respiratory function stable and patient connected to nasal cannula oxygen Cardiovascular status: blood pressure returned to baseline and stable Postop Assessment: no headache and no backache Anesthetic complications: no       Last Vitals:  Vitals:   01/01/17 0106 01/01/17 0542  BP: (!) 115/50 (!) 114/45  Pulse: 64 64  Resp: 15 15  Temp: 36.4 C 36.6 C    Last Pain:  Vitals:   01/01/17 0610  TempSrc:   PainSc: 3                  ROSE,GEORGE S

## 2017-01-05 ENCOUNTER — Encounter (HOSPITAL_COMMUNITY): Payer: Self-pay | Admitting: Emergency Medicine

## 2017-01-05 ENCOUNTER — Emergency Department (HOSPITAL_COMMUNITY)
Admission: EM | Admit: 2017-01-05 | Discharge: 2017-01-05 | Disposition: A | Discharge status: Home / Self Care | Payer: Medicare Other | Attending: Emergency Medicine | Admitting: Emergency Medicine

## 2017-01-05 DIAGNOSIS — Z87891 Personal history of nicotine dependence: Secondary | ICD-10-CM | POA: Insufficient documentation

## 2017-01-05 DIAGNOSIS — I10 Essential (primary) hypertension: Secondary | ICD-10-CM | POA: Diagnosis not present

## 2017-01-05 DIAGNOSIS — Z7982 Long term (current) use of aspirin: Secondary | ICD-10-CM | POA: Diagnosis not present

## 2017-01-05 DIAGNOSIS — Z96642 Presence of left artificial hip joint: Secondary | ICD-10-CM | POA: Diagnosis not present

## 2017-01-05 DIAGNOSIS — E039 Hypothyroidism, unspecified: Secondary | ICD-10-CM | POA: Insufficient documentation

## 2017-01-05 DIAGNOSIS — M7989 Other specified soft tissue disorders: Secondary | ICD-10-CM | POA: Diagnosis not present

## 2017-01-05 DIAGNOSIS — Z9889 Other specified postprocedural states: Secondary | ICD-10-CM

## 2017-01-05 DIAGNOSIS — Z79899 Other long term (current) drug therapy: Secondary | ICD-10-CM | POA: Diagnosis not present

## 2017-01-05 MED ORDER — ENOXAPARIN SODIUM 80 MG/0.8ML ~~LOC~~ SOLN
1.0000 mg/kg | Freq: Once | SUBCUTANEOUS | Status: AC
  Administered 2017-01-05: 65 mg via SUBCUTANEOUS
  Filled 2017-01-05: qty 0.65

## 2017-01-05 NOTE — Discharge Instructions (Signed)
It was our pleasure to provide your ER care today - we hope that you feel better.  Please return to [facility] tomorrow morning for vascular ultrasound to rule out DVT.   Return to ER if worse, new symptoms, fevers, chest pain, trouble breathing, other concern.

## 2017-01-05 NOTE — ED Provider Notes (Addendum)
[facility] DEPT Provider Note   CSN: [id number] Arrival date & time: 01/05/17  1827     History   Chief Complaint No chief complaint on file.   HPI Sandra Ellis is a 71 y.o. female.  Patient s/p left THA last week, c/o feeling left leg swelling, behind knee and lower leg since surgery, also small amt bruising to posterior left knee. Denies abrupt/acute worsening of pain/swelling. Denies numbness/weakness. Is doing her home exercises as instructed. Is on asa, no anticoag use. ?remote hx dvt. Denies chest pain or sob.    The history is provided by the patient.    Past Medical History:  Diagnosis Date  . Elevated cholesterol   . H/O blood clots 1990    Patient Active Problem List   Diagnosis Date Noted  . 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 Acute bronchitis 02/15/2016  . Bronchiectasis ([place]) 02/15/2016  . Cough 02/15/2016  . Bronchiectasis without acute exacerbation ([place]) 02/01/2016  . Dyspnea 01/11/2016  . Pulmonary nodule 11/18/2014  . Essential hypertension, benign 12/29/2013  . Unspecified hypothyroidism 12/29/2013    Past Surgical History:  Procedure Laterality Date  . KNEE SURGERY     cyst removed  . tonisllectomy    . TOTAL HIP ARTHROPLASTY Left 12/31/2016   Procedure: LEFT TOTAL HIP ARTHROPLASTY ANTERIOR APPROACH;  Surgeon: Paralee Cancel, MD;  Location: [facility] ORS;  Service: Orthopedics;  Laterality: Left;  Marland Kitchen VAGINAL HYSTERECTOMY     TVH    OB History    Gravida Para Term Preterm AB Living   3 3       3    SAB TAB Ectopic Multiple Live Births                   Home Medications    Prior to Admission medications   Medication Sig Start Date End Date Taking? Authorizing Provider  aspirin 81 MG chewable tablet Chew 2 tablets (162 mg total) by mouth 2 (two) times daily. Take for 4 weeks. 01/01/17 01/31/17  Danae Orleans, PA-C  atenolol (TENORMIN) 25 MG tablet Take 12.5 mg by mouth at bedtime.     [provider]  Coenzyme Q10 (CO Q10) 100 MG CAPS  Take 100 mg by mouth at bedtime.    [provider]  docusate sodium (COLACE) 100 MG capsule Take 1 capsule (100 mg total) by mouth 2 (two) times daily. 12/31/16   Danae Orleans, PA-C  ferrous sulfate (FERROUSUL) 325 (65 FE) MG tablet Take 1 tablet (325 mg total) by mouth 3 (three) times daily with meals. 12/31/16   Danae Orleans, PA-C  hydrochlorothiazide (HYDRODIURIL) 25 MG tablet Take 25 mg by mouth daily at 2 PM.     [provider]  HYDROcodone-acetaminophen (NORCO) 7.5-325 MG tablet Take 1-2 tablets by mouth every 4 (four) hours as needed for moderate pain or severe pain. 12/31/16   Danae Orleans, PA-C  levothyroxine (SYNTHROID, LEVOTHROID) 75 MCG tablet Take 75 mcg by mouth daily before breakfast.     [provider]  Magnesium 250 MG TABS Take 250 mg by mouth daily at 2 PM.    [provider]  methocarbamol (ROBAXIN) 500 MG tablet Take 1 tablet (500 mg total) by mouth every 6 (six) hours as needed for muscle spasms. 12/31/16   Danae Orleans, PA-C  Multiple Vitamins-Minerals (HAIR SKIN AND NAILS FORMULA PO) Take 1 tablet by mouth daily.    [provider]  Omega-3 Fatty Acids (FISH OIL) 1200 MG CAPS Take 1,200 mg  by mouth 2 (two) times daily.    [provider]  OVER THE COUNTER MEDICATION Take 4 each by mouth 4 (four) times a week. Juicy juice multivitamin - chewable 2 vegetable and 2 fruit.     [provider]  polyethylene glycol (MIRALAX / GLYCOLAX) packet Take 17 g by mouth 2 (two) times daily. 12/31/16   Danae Orleans, PA-C  potassium gluconate 595 (99 K) MG TABS tablet Take 595 mg by mouth daily at 2 PM.    [provider]  Probiotic Product (PROBIOTIC PO) Take 2 tablets by mouth daily.    [provider]  rosuvastatin (CRESTOR) 5 MG tablet Take 5 mg by mouth at bedtime.    [provider]  tretinoin (RETIN-A) 0.1 % cream Apply 1 application topically at bedtime. APPLIED TO FACE 10/10/16   [provider]  vitamin B-12 (CYANOCOBALAMIN) 1000 MCG tablet Take 1,000 mcg by mouth daily at 2 PM.    [provider]    Family History Family History  Problem Relation Age of Onset  . Diabetes Mother   . Asthma Mother     Social History Social History  Substance Use Topics  . Smoking status: Former Smoker    Packs/day: 0.50    Years: 8.00    Types: Cigarettes    Quit date: 09/02/1973  . Smokeless tobacco: Never Used  . Alcohol use No     Allergies   Levaquin [levofloxacin in d5w] and Ciprofloxacin   Review of Systems Review of Systems  Constitutional: Negative for chills and fever.  Respiratory: Negative for shortness of breath.   Cardiovascular: Positive for leg swelling. Negative for chest pain.  Hematological: Does not bruise/bleed easily.     Physical Exam Updated Vital Signs BP (!) 120/55 (BP Location: Right Arm)   Pulse 72   Temp 98.2 F (36.8 C) (Oral)   Resp 18   Ht 5\' 7"  (1.702 m)   Wt 64 kg   SpO2 97%   BMI 22.08 kg/m   Physical Exam  Constitutional: She appears well-developed and well-nourished. No distress.  HENT:  Head: Atraumatic.  Eyes: Conjunctivae are normal. No scleral icterus.  Neck: No tracheal deviation present.  Cardiovascular: Normal rate and intact distal pulses.   Pulmonary/Chest: Effort normal. No respiratory distress.  Abdominal: Normal appearance.  Musculoskeletal: She exhibits no edema.  Mild swelling left leg. Healing surgical incision w dressing intact, no sign of infection around area. dp/pt 2+. Minimal dependent bruising noted posterior to left knee.   Neurological: She is alert.  Skin: Skin is warm and dry. No rash noted.  Psychiatric: She has a normal mood and affect.  Nursing note and vitals reviewed.    ED Treatments / Results  Labs (all labs ordered are listed, but only abnormal results are displayed) Labs Reviewed - No data to display  EKG  EKG Interpretation None       Radiology No  results found.  Procedures Procedures (including critical care time)  Medications Ordered in ED Medications  enoxaparin (LOVENOX) injection 65 mg (not administered)     Initial Impression / Assessment and Plan / ED Course  I have reviewed the triage vital signs and the nursing notes.  Pertinent labs & imaging results that were available during my care of the patient were reviewed by me and considered in my medical decision making (see chart for details).  Venous doppler ordered upon my eval - however they are no longer available.   Discussed  plan for dose lovenox in ED and vascular u/s in AM.  Pt agreeable w plan.  Vascular tech has already left for night - will have return to [facility] in AM.     Final Clinical Impressions(s) / ED Diagnoses   Final diagnoses:  None    New Prescriptions New Prescriptions   No medications on file     Lajean Saver, MD 01/05/17 2012    Lajean Saver, MD 01/05/17 2054

## 2017-01-05 NOTE — ED Triage Notes (Signed)
Pt reports she had a L hip replacement on Tuesday. Pt began to note L calf swelling and bruising today. On aspirin, but not blood thinners.

## 2017-01-06 ENCOUNTER — Encounter (HOSPITAL_COMMUNITY): Payer: Self-pay | Admitting: Orthopedic Surgery

## 2017-01-06 ENCOUNTER — Ambulatory Visit (HOSPITAL_COMMUNITY)
Admission: RE | Admit: 2017-01-06 | Discharge: 2017-01-06 | Disposition: A | Discharge status: Home / Self Care | Payer: Medicare Other | Source: Ambulatory Visit | Attending: Emergency Medicine | Admitting: Emergency Medicine

## 2017-01-06 DIAGNOSIS — M79605 Pain in left leg: Secondary | ICD-10-CM | POA: Diagnosis not present

## 2017-01-06 DIAGNOSIS — Z96642 Presence of left artificial hip joint: Secondary | ICD-10-CM | POA: Insufficient documentation

## 2017-01-06 DIAGNOSIS — M79609 Pain in unspecified limb: Secondary | ICD-10-CM | POA: Diagnosis not present

## 2017-01-06 NOTE — Progress Notes (Signed)
VASCULAR LAB PRELIMINARY  PRELIMINARY  PRELIMINARY  PRELIMINARY  Left lower extremity venous duplex completed.    Preliminary report:  There is no DVT or SVT noted in the left lower extremity.  The tibioperoneal trunk is dilated with sluggish flow.  Sluggish flow also noted in the bilateral common femoral veins.    Called report to [place] at [facility], [facility], RVT 01/06/2017, 9:06 AM

## 2017-01-06 NOTE — Addendum Note (Signed)
Addendum  created 01/06/17 0651 by Lollie Sails, CRNA   Anesthesia Event edited, Anesthesia Staff edited

## 2017-01-08 NOTE — Discharge Summary (Signed)
Physician Discharge Summary  Patient ID: Sandra Ellis MRN: [id number] DOB/AGE: 1945-11-15 71 y.o.  Admit date: 12/31/2016 Discharge date: 01/01/2017   Procedures:  Procedure(s) (LRB): LEFT TOTAL HIP ARTHROPLASTY ANTERIOR APPROACH (Left)  Attending Physician:  Dr. Paralee Cancel   Admission Diagnoses:    Left hip primary OA / pain  Discharge Diagnoses:  Principal Problem:   S/P left THA, AA  Past Medical History:  Diagnosis Date  . Elevated cholesterol   . H/O blood clots 1990    HPI:    Sandra Ellis, 71 y.o. female, has a history of pain and functional disability in the left hip(s) due to arthritis and patient has failed non-surgical conservative treatments for greater than 12 weeks to include NSAID's and/or analgesics and activity modification.  Onset of symptoms was gradual starting 7+ years ago with gradually worsening course since that time.The patient noted no past surgery on the left hip(s).  Patient currently rates pain in the left hip at 10 out of 10 with activity. Patient has night pain, worsening of pain with activity and weight bearing, trendelenberg gait, pain that interfers with activities of daily living and pain with passive range of motion. Patient has evidence of periarticular osteophytes and joint space narrowing by imaging studies. This condition presents safety issues increasing the risk of falls.  There is no current active infection.   Risks, benefits and expectations were discussed with the patient.  Risks including but not limited to the risk of anesthesia, blood clots, nerve damage, blood vessel damage, failure of the prosthesis, infection and up to and including death.  Patient understand the risks, benefits and expectations and wishes to proceed with surgery.  PCP: Shirline Frees, MD   Discharged Condition: good  Hospital Course:  Patient underwent the above stated procedure on 12/31/2016. Patient tolerated the procedure well and brought to the recovery  room in good condition and subsequently to the floor.  POD #1 BP: 114/45 ; Pulse: 64 ; Temp: 97.8 F (36.6 C) ; Resp: 15 Patient reports pain as mild, pain controlled. No events throughout the night.  Dr. Alvan Dame had a discussion with the patient with the previously questionable blood clot.  Patient and Dr. Alvan Dame agree with the use of ASA over Xarelto in the post-op time frame for DVT/PE prophylaxis.  Patient looking forward to getting better.  Ready to be discharged home. Dorsiflexion/plantar flexion intact, incision: dressing C/D/I, no cellulitis present and compartment soft.   LABS  Basename    HGB     11.8  HCT     34.4     Discharge Exam: General appearance: alert, cooperative and no distress Extremities: Homans sign is negative, no sign of DVT, no edema, redness or tenderness in the calves or thighs and no ulcers, gangrene or trophic changes  Disposition: Home with follow up in 2 weeks   Follow-up Information    Paralee Cancel, MD. Schedule an appointment as soon as possible for a visit in 2 week(s).   Specialty:  Orthopedic Surgery Contact information: [address] [place] [address] [phone]           Discharge Instructions    Call MD / Call 911    Complete by:  As directed    If you experience chest pain or shortness of breath, CALL 911 and be transported to the hospital emergency room.  If you develope a fever above 101 F, pus (white drainage) or increased drainage or redness at the wound, or calf  pain, call your surgeon's office.   Change dressing    Complete by:  As directed    Maintain surgical dressing until follow up in the clinic. If the edges start to pull up, may reinforce with tape. If the dressing is no longer working, may remove and cover with gauze and tape, but must keep the area dry and clean.  Call with any questions or concerns.   Constipation Prevention    Complete by:  As directed    Drink plenty of fluids.  Prune juice may  be helpful.  You may use a stool softener, such as Colace (over the counter) 100 mg twice a day.  Use MiraLax (over the counter) for constipation as needed.   Diet - low sodium heart healthy    Complete by:  As directed    Discharge instructions    Complete by:  As directed    Maintain surgical dressing until follow up in the clinic. If the edges start to pull up, may reinforce with tape. If the dressing is no longer working, may remove and cover with gauze and tape, but must keep the area dry and clean.  Follow up in 2 weeks at [facility]. Call with any questions or concerns.   Increase activity slowly as tolerated    Complete by:  As directed    Weight bearing as tolerated with assist device (walker, cane, etc) as directed, use it as long as suggested by your surgeon or therapist, typically at least 4-6 weeks.   TED hose    Complete by:  As directed    Use stockings (TED hose) for 2 weeks on both leg(s).  You may remove them at night for sleeping.      Allergies as of 01/01/2017      Reactions   Levaquin [levofloxacin In D5w] Other (See Comments)   NUMBNESS/REDNESS   Ciprofloxacin Rash, Other (See Comments)   NUMBNESS/REDNESS/RASH      Medication List    STOP taking these medications   aspirin EC 81 MG tablet Replaced by:  aspirin 81 MG chewable tablet   sulfamethoxazole-trimethoprim 800-160 MG tablet Commonly known as:  BACTRIM DS,SEPTRA DS     TAKE these medications   aspirin 81 MG chewable tablet Chew 2 tablets (162 mg total) by mouth 2 (two) times daily. Take for 4 weeks. Replaces:  aspirin EC 81 MG tablet   atenolol 25 MG tablet Commonly known as:  TENORMIN Take 12.5 mg by mouth at bedtime.   Co Q10 100 MG Caps Take 100 mg by mouth at bedtime.   docusate sodium 100 MG capsule Commonly known as:  COLACE Take 1 capsule (100 mg total) by mouth 2 (two) times daily.   ferrous sulfate 325 (65 FE) MG tablet Commonly known as:  FERROUSUL Take 1 tablet (325  mg total) by mouth 3 (three) times daily with meals.   Fish Oil 1200 MG Caps Take 1,200 mg by mouth 2 (two) times daily.   HAIR SKIN AND NAILS FORMULA PO Take 1 tablet by mouth daily.   hydrochlorothiazide 25 MG tablet Commonly known as:  HYDRODIURIL Take 25 mg by mouth daily at 2 PM.   HYDROcodone-acetaminophen 7.5-325 MG tablet Commonly known as:  NORCO Take 1-2 tablets by mouth every 4 (four) hours as needed for moderate pain or severe pain.   levothyroxine 75 MCG tablet Commonly known as:  SYNTHROID, LEVOTHROID Take 75 mcg by mouth daily before breakfast.   Magnesium 250 MG Tabs Take  250 mg by mouth daily at 2 PM.   methocarbamol 500 MG tablet Commonly known as:  ROBAXIN Take 1 tablet (500 mg total) by mouth every 6 (six) hours as needed for muscle spasms.   OVER THE COUNTER MEDICATION Take 4 each by mouth 4 (four) times a week. Juicy juice multivitamin - chewable 2 vegetable and 2 fruit.   polyethylene glycol packet Commonly known as:  MIRALAX / GLYCOLAX Take 17 g by mouth 2 (two) times daily.   potassium gluconate 595 (99 K) MG Tabs tablet Take 595 mg by mouth daily at 2 PM.   PROBIOTIC PO Take 2 tablets by mouth daily.   rosuvastatin 5 MG tablet Commonly known as:  CRESTOR Take 5 mg by mouth at bedtime.   tretinoin 0.1 % cream Commonly known as:  RETIN-A Apply 1 application topically at bedtime. APPLIED TO FACE   vitamin B-12 1000 MCG tablet Commonly known as:  CYANOCOBALAMIN Take 1,000 mcg by mouth daily at 2 PM.        Signed: [place] Pugh. Artur Winningham   PA-C  01/08/2017, 7:41 AM

## 2017-01-08 NOTE — Progress Notes (Signed)
     Subjective: 1 Day Post-Op Procedure(s) (LRB): LEFT TOTAL HIP ARTHROPLASTY ANTERIOR APPROACH (Left)   Patient reports pain as mild, pain controlled. No events throughout the night.  Dr. Alvan Dame had a discussion with the patient with the previously questionable blood clot.  Patient and Dr. Alvan Dame agree with the use of ASA over Xarelto in the post-op time frame for DVT/PE prophylaxis.  Patient looking forward to getting better.  Ready to be discharged home.  Objective:   VITALS:    01/01/17 0542  BP: (!) 114/45  Pulse: 64  Resp: 15  Temp: 97.8 F (36.6 C)    Dorsiflexion/Plantar flexion intact Incision: dressing C/D/I No cellulitis present Compartment soft  LABS  LABS   Recent Labs  01/07/17 0558  HGB 11.8*  HCT 34.4*  WBC 15.4*  PLT 246        Recent Labs  01/07/17 0558  NA 133  K 3.7  BUN 11  CREATININE 0.71  GLUCOSE 176*          Assessment/Plan: 1 Day Post-Op Procedure(s) (LRB): LEFT TOTAL HIP ARTHROPLASTY ANTERIOR APPROACH (Left) Advance diet Up with therapy D/C IV fluids Discharge home Follow up in 2 weeks at [facility]. Follow up with OLIN,Jeramie Scogin D in 2 weeks.  Contact information:  [facility] [address] [place] Clemmie Marxen   PAC  01/01/2017, 9:10 AM

## 2017-01-15 DIAGNOSIS — Z96642 Presence of left artificial hip joint: Secondary | ICD-10-CM | POA: Diagnosis not present

## 2017-01-15 DIAGNOSIS — Z471 Aftercare following joint replacement surgery: Secondary | ICD-10-CM | POA: Diagnosis not present

## 2017-01-23 ENCOUNTER — Encounter (HOSPITAL_COMMUNITY): Payer: Self-pay | Admitting: *Deleted

## 2017-01-23 ENCOUNTER — Emergency Department (HOSPITAL_COMMUNITY)
Admission: EM | Admit: 2017-01-23 | Discharge: 2017-01-24 | Disposition: A | Discharge status: Home / Self Care | Payer: Medicare Other | Attending: Emergency Medicine | Admitting: Emergency Medicine

## 2017-01-23 ENCOUNTER — Emergency Department (HOSPITAL_COMMUNITY): Payer: Medicare Other

## 2017-01-23 DIAGNOSIS — E039 Hypothyroidism, unspecified: Secondary | ICD-10-CM | POA: Insufficient documentation

## 2017-01-23 DIAGNOSIS — R1011 Right upper quadrant pain: Secondary | ICD-10-CM | POA: Diagnosis not present

## 2017-01-23 DIAGNOSIS — Z87891 Personal history of nicotine dependence: Secondary | ICD-10-CM | POA: Insufficient documentation

## 2017-01-23 DIAGNOSIS — R140 Abdominal distension (gaseous): Secondary | ICD-10-CM | POA: Diagnosis not present

## 2017-01-23 DIAGNOSIS — R1013 Epigastric pain: Secondary | ICD-10-CM | POA: Diagnosis not present

## 2017-01-23 DIAGNOSIS — R110 Nausea: Secondary | ICD-10-CM | POA: Diagnosis not present

## 2017-01-23 DIAGNOSIS — I10 Essential (primary) hypertension: Secondary | ICD-10-CM | POA: Diagnosis not present

## 2017-01-23 DIAGNOSIS — R109 Unspecified abdominal pain: Secondary | ICD-10-CM | POA: Diagnosis not present

## 2017-01-23 DIAGNOSIS — Z96642 Presence of left artificial hip joint: Secondary | ICD-10-CM | POA: Diagnosis not present

## 2017-01-23 DIAGNOSIS — Z7982 Long term (current) use of aspirin: Secondary | ICD-10-CM | POA: Diagnosis not present

## 2017-01-23 LAB — COMPREHENSIVE METABOLIC PANEL
ALT: 18 U/L (ref 14–54)
AST: 21 U/L (ref 15–41)
Albumin: 3.9 g/dL (ref 3.5–5.0)
Alkaline Phosphatase: 88 U/L (ref 38–126)
Anion gap: 8 (ref 5–15)
BUN: 10 mg/dL (ref 6–20)
CO2: 26 mmol/L (ref 22–32)
Calcium: 9.5 mg/dL (ref 8.9–10.3)
Chloride: 94 mmol/L — ABNORMAL LOW (ref 101–111)
Creatinine, Ser: 0.77 mg/dL (ref 0.44–1.00)
GFR calc Af Amer: >60 mL/min (ref >60)
GFR calc non Af Amer: >60 mL/min (ref >60)
Glucose, Bld: 105 mg/dL — ABNORMAL HIGH (ref 65–99)
Potassium: 3.8 mmol/L (ref 3.5–5.1)
Sodium: 128 mmol/L — ABNORMAL LOW (ref 135–145)
Total Bilirubin: 0.6 mg/dL (ref 0.3–1.2)
Total Protein: 6.7 g/dL (ref 6.5–8.1)

## 2017-01-23 LAB — CBC
HCT: 37.4 % (ref 36.0–46.0)
Hemoglobin: 12.8 g/dL (ref 12.0–15.0)
MCH: 31 pg (ref 26.0–34.0)
MCHC: 34.2 g/dL (ref 30.0–36.0)
MCV: 90.6 fL (ref 78.0–100.0)
Platelets: 416 10*3/uL — ABNORMAL HIGH (ref 150–400)
RBC: 4.13 MIL/uL (ref 3.87–5.11)
RDW: 12.8 % (ref 11.5–15.5)
WBC: 9.5 10*3/uL (ref 4.0–10.5)

## 2017-01-23 LAB — URINALYSIS, ROUTINE W REFLEX MICROSCOPIC
Bilirubin Urine: NEGATIVE
Glucose, UA: NEGATIVE mg/dL
Ketones, ur: 5 mg/dL — AB
Leukocytes, UA: NEGATIVE
Nitrite: NEGATIVE
Protein, ur: NEGATIVE mg/dL
Specific Gravity, Urine: 1.004 — ABNORMAL LOW (ref 1.005–1.030)
pH: 6 (ref 5.0–8.0)

## 2017-01-23 LAB — LIPASE, BLOOD: Lipase: 26 U/L (ref 11–51)

## 2017-01-23 MED ORDER — GI COCKTAIL ~~LOC~~
30.0000 mL | Freq: Once | ORAL | Status: AC
  Administered 2017-01-23: 30 mL via ORAL
  Filled 2017-01-23: qty 30

## 2017-01-23 MED ORDER — TECHNETIUM TC 99M MEBROFENIN IV KIT
5.0000 | PACK | Freq: Once | INTRAVENOUS | Status: AC | PRN
  Administered 2017-01-23: 5 via INTRAVENOUS

## 2017-01-23 MED ORDER — ONDANSETRON HCL 4 MG PO TABS: 4.0000 mg | ORAL_TABLET | Freq: Three times a day (TID) | ORAL | 0 refills | Status: DC | PRN

## 2017-01-23 MED ORDER — OMEPRAZOLE-SODIUM BICARBONATE 40-1100 MG PO CAPS: 1.0000 | ORAL_CAPSULE | Freq: Two times a day (BID) | ORAL | 0 refills | Status: DC

## 2017-01-23 MED ORDER — SUCRALFATE 1 G PO TABS: 1.0000 g | ORAL_TABLET | Freq: Three times a day (TID) | ORAL | 0 refills | Status: DC

## 2017-01-23 NOTE — ED Triage Notes (Signed)
To ED for further eval of nausea and possible gallbladder problems. States she had ultrasound of gallbladder a month ago and MD was concerned with diseased gallbladder. States she was to have a total hip (done 3 wks ago) so she delayed the [place] scan her MD wanted next. States she has been watching her diet but now just feels nausea and 'sick'. Decreased appetite now. No sob. No cp.

## 2017-01-23 NOTE — ED Provider Notes (Addendum)
[facility] DEPT Provider Note   CSN: [id number] Arrival date & time: 01/23/17  1250     History   Chief Complaint Chief Complaint  Patient presents with  . Nausea    HPI Sandra Ellis is a 71 y.o. female who presents to the ED with cc of epigastric abdominal pain and nausea. She has had several intermittent episodes of this for many years. It seems to be related to eating fatty foods. [facility] her [facility]bladder. On 12/03/2016. The patient had a recent right upper quadrant abdominal ultrasound that showed a cyst on the liver and no other abnormalities. In the interim, the patient had a total hip arthroplasty. She states that she has been vigilant about her food intake and has not been eating any fatty foods. She has had 3 days of continuous epigastric pain with nausea and no vomiting following eating steak. Her last episode in April occurred after eating buttered popcorn. The patient has not had any active vomiting, diarrhea or fevers. She does have associated belching.  HPI  Past Medical History:  Diagnosis Date  . Elevated cholesterol   . H/O blood clots 1990    Patient Active Problem List   Diagnosis Date Noted  . 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 Acute bronchitis 02/15/2016  . Bronchiectasis ([place]) 02/15/2016  . Cough 02/15/2016  . Bronchiectasis without acute exacerbation ([place]) 02/01/2016  . Dyspnea 01/11/2016  . Pulmonary nodule 11/18/2014  . Essential hypertension, benign 12/29/2013  . Unspecified hypothyroidism 12/29/2013    Past Surgical History:  Procedure Laterality Date  . KNEE SURGERY     cyst removed  . tonisllectomy    . TOTAL HIP ARTHROPLASTY Left 12/31/2016   Procedure: LEFT TOTAL HIP ARTHROPLASTY ANTERIOR APPROACH;  Surgeon: Paralee Cancel, MD;  Location: [facility] ORS;  Service: Orthopedics;  Laterality: Left;  [place] VAGINAL HYSTERECTOMY     TVH    OB History    Gravida Para Term Preterm AB Living   3 3       3    SAB TAB Ectopic  Multiple Live Births                   Home Medications    Prior to Admission medications   Medication Sig Start Date End Date Taking? Authorizing Provider  aspirin 81 MG chewable tablet Chew 2 tablets (162 mg total) by mouth 2 (two) times daily. Take for 4 weeks. 01/01/17 01/31/17  Danae Orleans, PA-C  atenolol (TENORMIN) 25 MG tablet Take 12.5 mg by mouth at bedtime.     [provider]  Coenzyme Q10 (CO Q10) 100 MG CAPS Take 100 mg by mouth at bedtime.    [provider]  docusate sodium (COLACE) 100 MG capsule Take 1 capsule (100 mg total) by mouth 2 (two) times daily. 12/31/16   Danae Orleans, PA-C  ferrous sulfate (FERROUSUL) 325 (65 FE) MG tablet Take 1 tablet (325 mg total) by mouth 3 (three) times daily with meals. 12/31/16   Danae Orleans, PA-C  hydrochlorothiazide (HYDRODIURIL) 25 MG tablet Take 25 mg by mouth daily at 2 PM.     [provider]  HYDROcodone-acetaminophen (NORCO) 7.5-325 MG tablet Take 1-2 tablets by mouth every 4 (four) hours as needed for moderate pain or severe pain. 12/31/16   Danae Orleans, PA-C  levothyroxine (SYNTHROID, LEVOTHROID) 75 MCG tablet Take 75 mcg by mouth daily before breakfast.     [provider]  Magnesium 250 MG TABS  Take 250 mg by mouth daily at 2 PM.    [provider]  methocarbamol (ROBAXIN) 500 MG tablet Take 1 tablet (500 mg total) by mouth every 6 (six) hours as needed for muscle spasms. 12/31/16   Danae Orleans, PA-C  Multiple Vitamins-Minerals (HAIR SKIN AND NAILS FORMULA PO) Take 1 tablet by mouth daily.    [provider]  Omega-3 Fatty Acids (FISH OIL) 1200 MG CAPS Take 1,200 mg by mouth 2 (two) times daily.    [provider]  OVER THE COUNTER MEDICATION Take 4 each by mouth 4 (four) times a week. Juicy juice multivitamin - chewable 2 vegetable and 2 fruit.     [provider]  polyethylene glycol (MIRALAX / GLYCOLAX) packet Take 17 g by mouth 2 (two) times daily.  12/31/16   Danae Orleans, PA-C  potassium gluconate 595 (99 K) MG TABS tablet Take 595 mg by mouth daily at 2 PM.    [provider]  Probiotic Product (PROBIOTIC PO) Take 2 tablets by mouth daily.    [provider]  rosuvastatin (CRESTOR) 5 MG tablet Take 5 mg by mouth at bedtime.    [provider]  tretinoin (RETIN-A) 0.1 % cream Apply 1 application topically at bedtime. APPLIED TO FACE 10/10/16   [provider]  vitamin B-12 (CYANOCOBALAMIN) 1000 MCG tablet Take 1,000 mcg by mouth daily at 2 PM.    [provider]    Family History Family History  Problem Relation Age of Onset  . Diabetes Mother   . Asthma Mother     Social History Social History  Substance Use Topics  . Smoking status: Former Smoker    Packs/day: 0.50    Years: 8.00    Types: Cigarettes    Quit date: 09/02/1973  . Smokeless tobacco: Never Used  . Alcohol use No     Allergies   Levaquin [levofloxacin in d5w] and Ciprofloxacin   Review of Systems Review of Systems  Ten systems reviewed and are negative for acute change, except as noted in the HPI.   Physical Exam Updated Vital Signs BP 135/63   Pulse (!) 51   Temp 97.5 F (36.4 C) (Oral)   Resp 18   SpO2 100%   Physical Exam  Constitutional: She is oriented to person, place, and time. She appears well-developed and well-nourished. No distress.  HENT:  Head: Normocephalic and atraumatic.  Eyes: Conjunctivae are normal. No scleral icterus.  Neck: Normal range of motion.  Cardiovascular: Normal rate, regular rhythm and normal heart sounds.  Exam reveals no gallop and no friction rub.   No murmur heard. Pulmonary/Chest: Effort normal and breath sounds normal. No respiratory distress.  Abdominal: Soft. Bowel sounds are normal. She exhibits no distension and no mass. There is tenderness (epigastrium). There is no guarding.  Neurological: She is alert and oriented to person, place, and time.  Skin: Skin  is warm and dry. She is not diaphoretic.  Psychiatric: Her behavior is normal.  Nursing note and vitals reviewed.    ED Treatments / Results  Labs (all labs ordered are listed, but only abnormal results are displayed) Labs Reviewed  COMPREHENSIVE METABOLIC PANEL - Abnormal; Notable for the following:       Result Value   Sodium 128 (*)    Chloride 94 (*)    Glucose, Bld 105 (*)    All other components within normal limits  CBC - Abnormal; Notable for the following:    Platelets 416 (*)  All other components within normal limits  LIPASE, BLOOD  URINALYSIS, ROUTINE W REFLEX MICROSCOPIC    EKG  EKG Interpretation None       Radiology No results found.  Procedures Procedures (including critical care time)  Medications Ordered in ED Medications  gi cocktail (Maalox,Lidocaine,Donnatal) (30 mLs Oral Given 01/23/17 1548)     Initial Impression / Assessment and Plan / ED Course  I have reviewed the triage vital signs and the nursing notes.  Pertinent labs & imaging results that were available during my care of the patient were reviewed by me and considered in my medical decision making (see chart for details).     Patient with epigastric pain has been intermittent for many years. I spoke with Dr.Brahmbhatt of [facility], who suggested a HIDA scan here. Patient HIDA scan is negative for any abnormality. Dr. Alessandra Bevels  recommends PPI twice a day for at least 2 weeks. She is advised to follow-up with the gastroenterologist. Tolerating by mouth fluids. She appears safe for discharge at this time  Final Clinical Impressions(s) / ED Diagnoses   Final diagnoses:  Postprandial abdominal pain in right upper quadrant  Postprandial nausea  Epigastric pain    New Prescriptions New Prescriptions   No medications on file     Margarita Mail, PA-C 01/24/17 0112    Tegeler, Gwenyth Allegra, MD 01/29/17 1313    Margarita Mail, PA-C 02/26/17 1631    Tegeler,  Gwenyth Allegra, MD 02/26/17 1750

## 2017-01-23 NOTE — Discharge Instructions (Signed)

## 2017-01-23 NOTE — ED Notes (Signed)
Called [facility] who advises they will come for pt in the next 30 min.

## 2017-01-23 NOTE — ED Notes (Signed)
Pt transported to [facility]  

## 2017-01-23 NOTE — ED Notes (Signed)
Pt rpeorts that she has had problems with GI for years but this episode has lasted 3 days.

## 2017-01-28 DIAGNOSIS — R1032 Left lower quadrant pain: Secondary | ICD-10-CM | POA: Diagnosis not present

## 2017-01-28 DIAGNOSIS — R140 Abdominal distension (gaseous): Secondary | ICD-10-CM | POA: Diagnosis not present

## 2017-01-28 DIAGNOSIS — R1030 Lower abdominal pain, unspecified: Secondary | ICD-10-CM | POA: Diagnosis not present

## 2017-01-31 NOTE — Addendum Note (Signed)
Addendum  created 01/31/17 1250 by Effie Berkshire, MD   Sign clinical note

## 2017-02-11 ENCOUNTER — Ambulatory Visit: Payer: Medicare Other | Admitting: Women's Health

## 2017-02-12 DIAGNOSIS — Z471 Aftercare following joint replacement surgery: Secondary | ICD-10-CM | POA: Diagnosis not present

## 2017-02-12 DIAGNOSIS — Z96642 Presence of left artificial hip joint: Secondary | ICD-10-CM | POA: Diagnosis not present

## 2017-02-12 DIAGNOSIS — M25652 Stiffness of left hip, not elsewhere classified: Secondary | ICD-10-CM | POA: Diagnosis not present

## 2017-03-21 IMAGING — DX DG CHEST 2V
2 series · 2 of 2 positions shown · non-contrast
Comparison: 02/15/2016 and prior chest radiographs dating back to
07/04/2008

CLINICAL DATA: Chest pain for 5 days and acute cough today.

EXAM:
CHEST  2 VIEW

[chest pa]
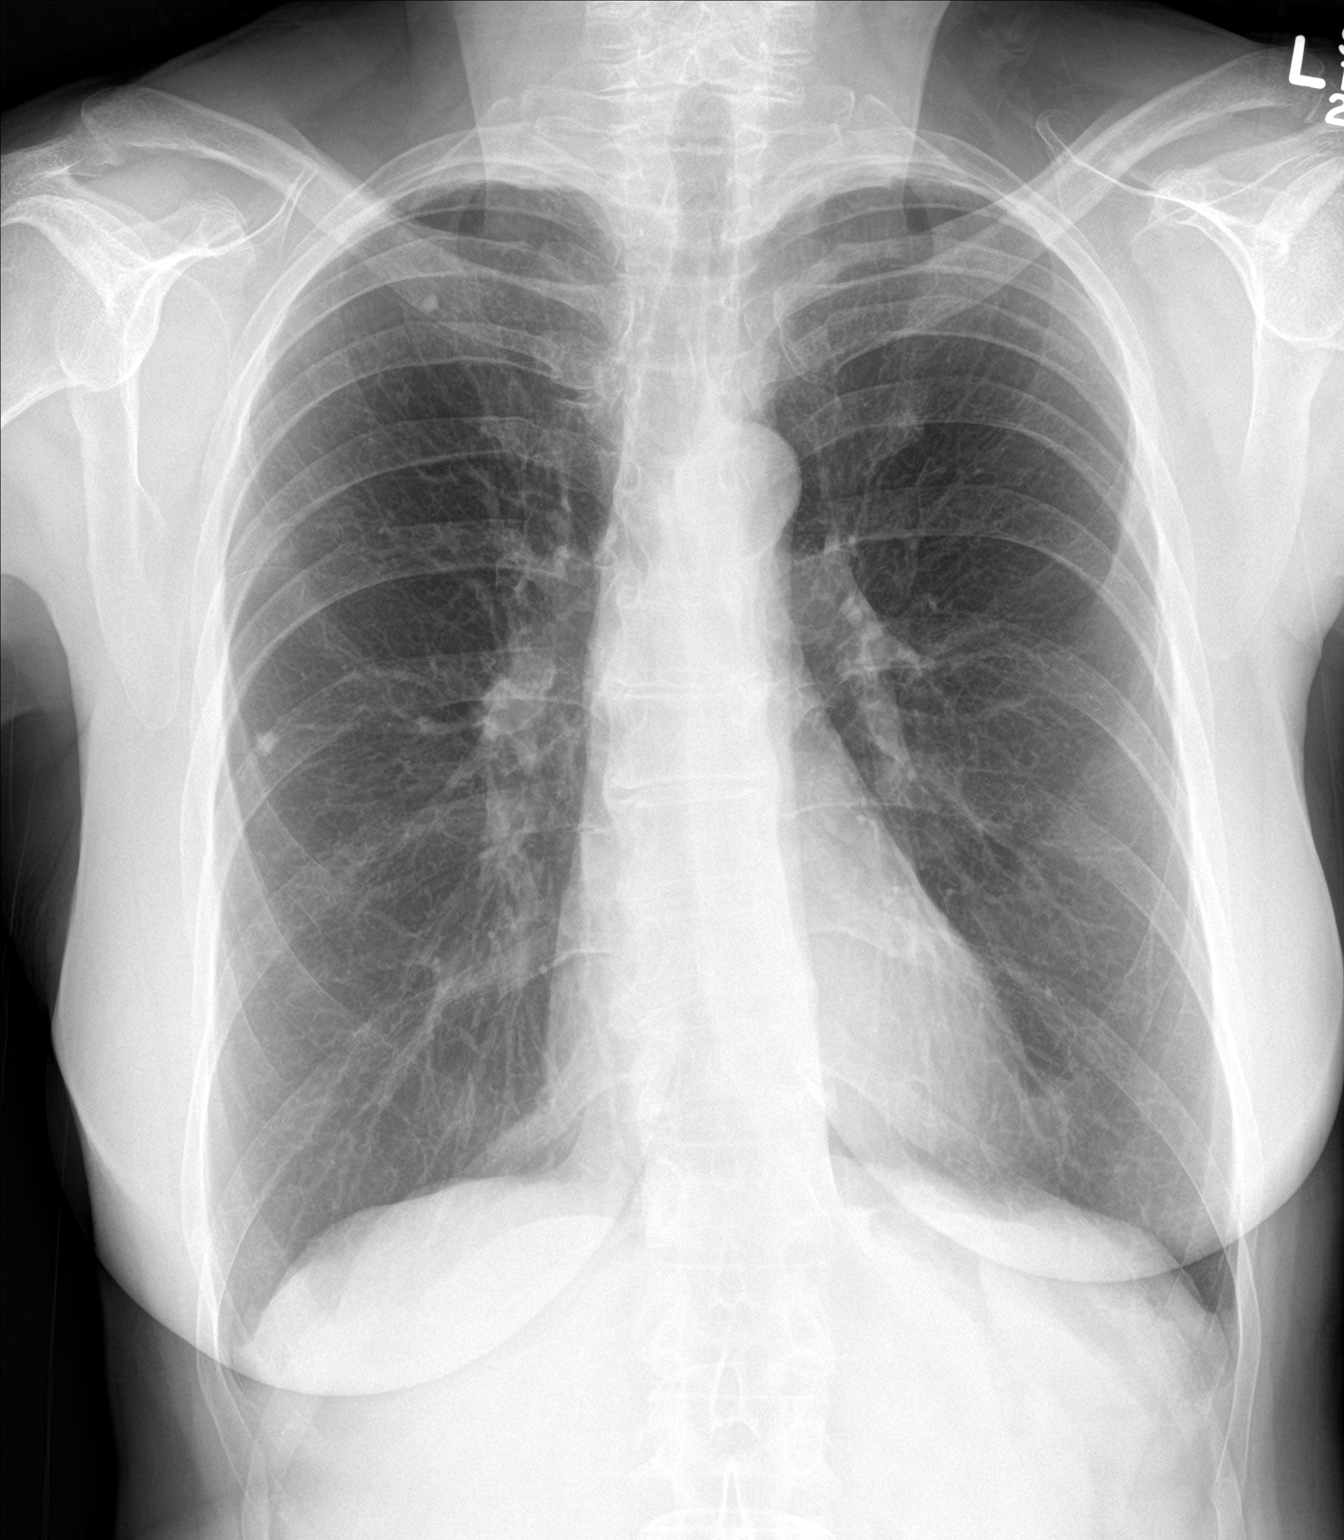

[chest lat]
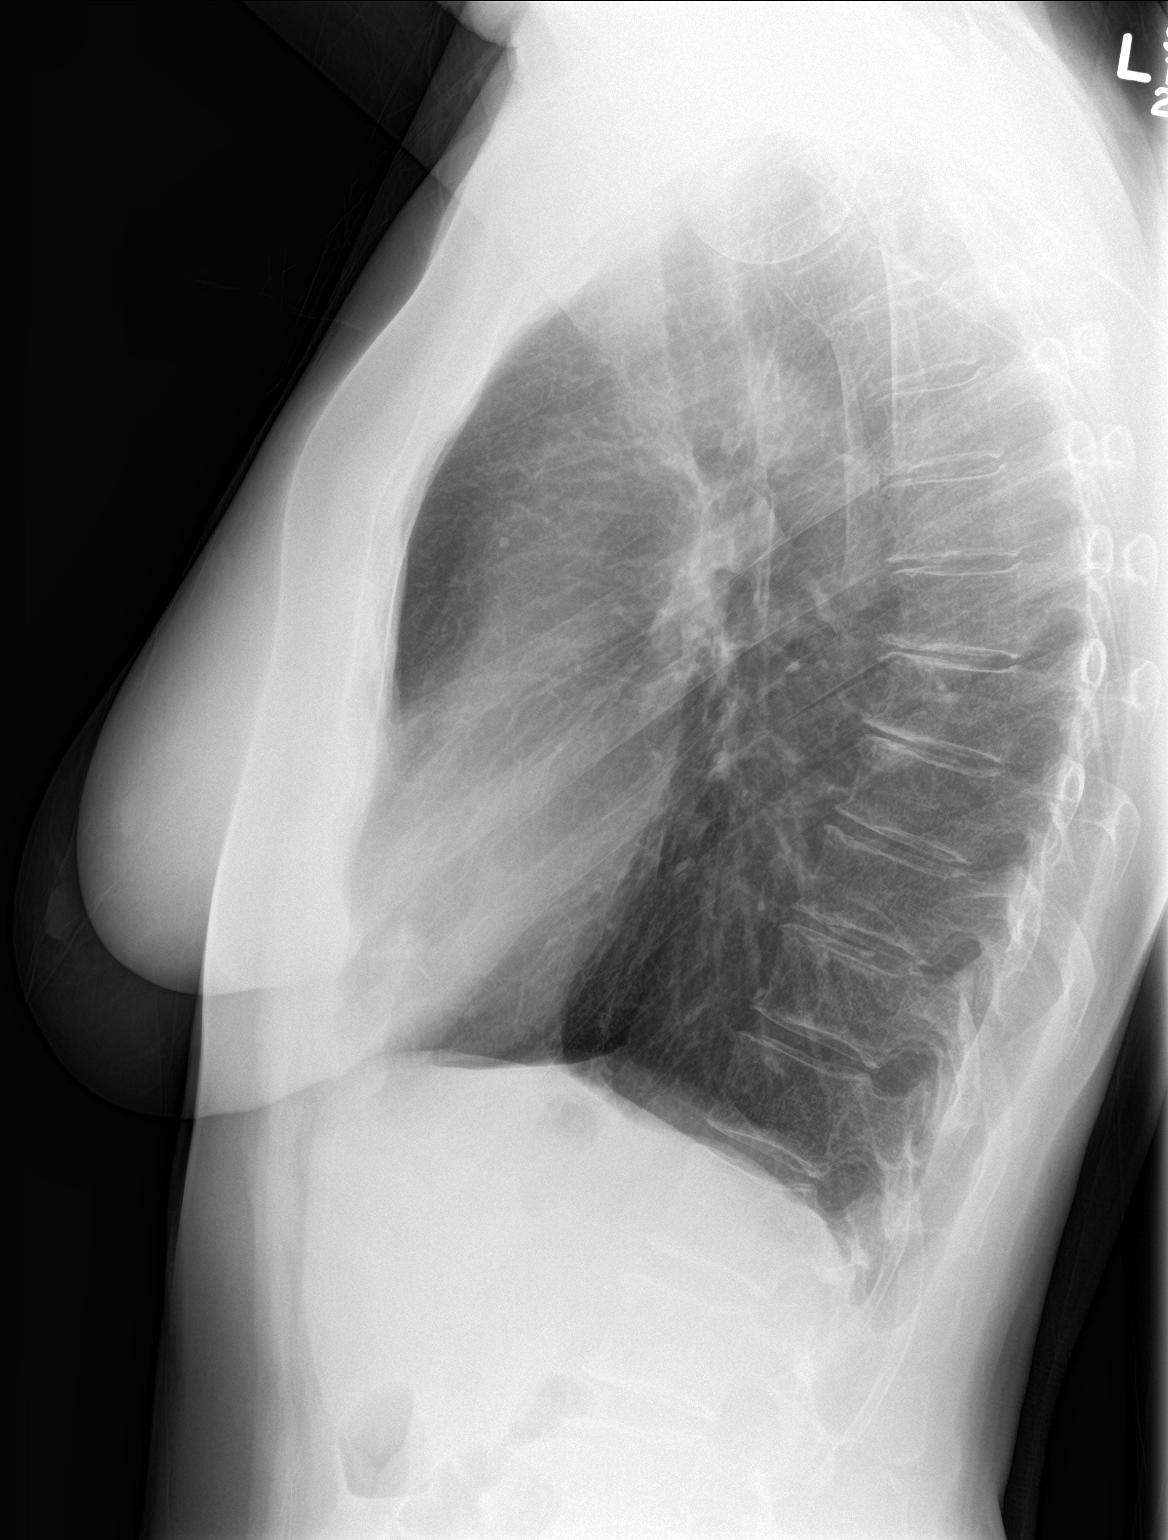

[2 of 2 positions shown; findings below may reference images not displayed]

FINDINGS: The cardiomediastinal silhouette is unremarkable.

There is no evidence of focal airspace disease, pulmonary edema,
suspicious pulmonary nodule/mass, pleural effusion, or pneumothorax.

Right lung calcified granulomas again noted.

No acute bony abnormalities are identified.
IMPRESSION: No evidence of active cardiopulmonary disease.

## 2017-03-26 DIAGNOSIS — Z471 Aftercare following joint replacement surgery: Secondary | ICD-10-CM | POA: Diagnosis not present

## 2017-03-26 DIAGNOSIS — Z96642 Presence of left artificial hip joint: Secondary | ICD-10-CM | POA: Diagnosis not present

## 2017-03-28 DIAGNOSIS — H52203 Unspecified astigmatism, bilateral: Secondary | ICD-10-CM | POA: Diagnosis not present

## 2017-03-28 DIAGNOSIS — H01004 Unspecified blepharitis left upper eyelid: Secondary | ICD-10-CM | POA: Diagnosis not present

## 2017-03-28 DIAGNOSIS — H01001 Unspecified blepharitis right upper eyelid: Secondary | ICD-10-CM | POA: Diagnosis not present

## 2017-03-28 DIAGNOSIS — H2513 Age-related nuclear cataract, bilateral: Secondary | ICD-10-CM | POA: Diagnosis not present

## 2017-04-30 DIAGNOSIS — R600 Localized edema: Secondary | ICD-10-CM | POA: Diagnosis not present

## 2017-04-30 DIAGNOSIS — E7800 Pure hypercholesterolemia, unspecified: Secondary | ICD-10-CM | POA: Diagnosis not present

## 2017-04-30 DIAGNOSIS — R002 Palpitations: Secondary | ICD-10-CM | POA: Diagnosis not present

## 2017-04-30 DIAGNOSIS — R7303 Prediabetes: Secondary | ICD-10-CM | POA: Diagnosis not present

## 2017-04-30 DIAGNOSIS — E039 Hypothyroidism, unspecified: Secondary | ICD-10-CM | POA: Diagnosis not present

## 2017-06-24 DIAGNOSIS — E785 Hyperlipidemia, unspecified: Secondary | ICD-10-CM | POA: Diagnosis not present

## 2017-06-24 DIAGNOSIS — R002 Palpitations: Secondary | ICD-10-CM | POA: Diagnosis not present

## 2017-06-24 DIAGNOSIS — E039 Hypothyroidism, unspecified: Secondary | ICD-10-CM | POA: Diagnosis not present

## 2017-06-25 DIAGNOSIS — E039 Hypothyroidism, unspecified: Secondary | ICD-10-CM | POA: Diagnosis not present

## 2017-06-25 DIAGNOSIS — R002 Palpitations: Secondary | ICD-10-CM | POA: Diagnosis not present

## 2017-06-25 DIAGNOSIS — E785 Hyperlipidemia, unspecified: Secondary | ICD-10-CM | POA: Diagnosis not present

## 2017-08-07 DIAGNOSIS — E1165 Type 2 diabetes mellitus with hyperglycemia: Secondary | ICD-10-CM | POA: Diagnosis not present

## 2017-08-07 DIAGNOSIS — E039 Hypothyroidism, unspecified: Secondary | ICD-10-CM | POA: Diagnosis not present

## 2017-08-07 DIAGNOSIS — E1122 Type 2 diabetes mellitus with diabetic chronic kidney disease: Secondary | ICD-10-CM | POA: Diagnosis not present

## 2017-08-07 DIAGNOSIS — N182 Chronic kidney disease, stage 2 (mild): Secondary | ICD-10-CM | POA: Diagnosis not present

## 2017-08-07 DIAGNOSIS — Z23 Encounter for immunization: Secondary | ICD-10-CM | POA: Diagnosis not present

## 2017-08-07 DIAGNOSIS — E785 Hyperlipidemia, unspecified: Secondary | ICD-10-CM | POA: Diagnosis not present

## 2017-09-25 DIAGNOSIS — R079 Chest pain, unspecified: Secondary | ICD-10-CM | POA: Diagnosis not present

## 2017-09-25 DIAGNOSIS — M546 Pain in thoracic spine: Secondary | ICD-10-CM | POA: Diagnosis not present

## 2017-09-25 DIAGNOSIS — R001 Bradycardia, unspecified: Secondary | ICD-10-CM | POA: Diagnosis not present

## 2017-09-25 DIAGNOSIS — R1013 Epigastric pain: Secondary | ICD-10-CM | POA: Diagnosis not present

## 2017-09-25 DIAGNOSIS — M549 Dorsalgia, unspecified: Secondary | ICD-10-CM | POA: Diagnosis not present

## 2017-09-30 ENCOUNTER — Encounter: Payer: Self-pay | Admitting: Women's Health

## 2017-09-30 ENCOUNTER — Ambulatory Visit (INDEPENDENT_AMBULATORY_CARE_PROVIDER_SITE_OTHER): Payer: Medicare Other | Admitting: Women's Health

## 2017-09-30 VITALS — BP 110/78 | Ht 67.0 in | Wt 138.6 lb

## 2017-09-30 DIAGNOSIS — Z01419 Encounter for gynecological examination (general) (routine) without abnormal findings: Secondary | ICD-10-CM | POA: Diagnosis not present

## 2017-09-30 DIAGNOSIS — N952 Postmenopausal atrophic vaginitis: Secondary | ICD-10-CM | POA: Diagnosis not present

## 2017-09-30 NOTE — Patient Instructions (Signed)
Health Maintenance for Postmenopausal Women Menopause is a normal process in which your reproductive ability comes to an end. This process happens gradually over a span of months to years, usually between the ages of 22 and 9. Menopause is complete when you have missed 12 consecutive menstrual periods. It is important to talk with your health care provider about some of the most common conditions that affect postmenopausal women, such as heart disease, cancer, and bone loss (osteoporosis). Adopting a healthy lifestyle and getting preventive care can help to promote your health and wellness. Those actions can also lower your chances of developing some of these common conditions. What should I know about menopause? During menopause, you may experience a number of symptoms, such as:  Moderate-to-severe hot flashes.  Night sweats.  Decrease in sex drive.  Mood swings.  Headaches.  Tiredness.  Irritability.  Memory problems.  Insomnia.  Choosing to treat or not to treat menopausal changes is an individual decision that you make with your health care provider. What should I know about hormone replacement therapy and supplements? Hormone therapy products are effective for treating symptoms that are associated with menopause, such as hot flashes and night sweats. Hormone replacement carries certain risks, especially as you become older. If you are thinking about using estrogen or estrogen with progestin treatments, discuss the benefits and risks with your health care provider. What should I know about heart disease and stroke? Heart disease, heart attack, and stroke become more likely as you age. This may be due, in part, to the hormonal changes that your body experiences during menopause. These can affect how your body processes dietary fats, triglycerides, and cholesterol. Heart attack and stroke are both medical emergencies. There are many things that you can do to help prevent heart disease  and stroke:  Have your blood pressure checked at least every 1-2 years. High blood pressure causes heart disease and increases the risk of stroke.  If you are 53-22 years old, ask your health care provider if you should take aspirin to prevent a heart attack or a stroke.  Do not use any tobacco products, including cigarettes, chewing tobacco, or electronic cigarettes. If you need help quitting, ask your health care provider.  It is important to eat a healthy diet and maintain a healthy weight. ? Be sure to include plenty of vegetables, fruits, low-fat dairy products, and lean protein. ? Avoid eating foods that are high in solid fats, added sugars, or salt (sodium).  Get regular exercise. This is one of the most important things that you can do for your health. ? Try to exercise for at least 150 minutes each week. The type of exercise that you do should increase your heart rate and make you sweat. This is known as moderate-intensity exercise. ? Try to do strengthening exercises at least twice each week. Do these in addition to the moderate-intensity exercise.  Know your numbers.Ask your health care provider to check your cholesterol and your blood glucose. Continue to have your blood tested as directed by your health care provider.  What should I know about cancer screening? There are several types of cancer. Take the following steps to reduce your risk and to catch any cancer development as early as possible. Breast Cancer  Practice breast self-awareness. ? This means understanding how your breasts normally appear and feel. ? It also means doing regular breast self-exams. Let your health care provider know about any changes, no matter how small.  If you are 40  or older, have a clinician do a breast exam (clinical breast exam or CBE) every year. Depending on your age, family history, and medical history, it may be recommended that you also have a yearly breast X-ray (mammogram).  If you  have a family history of breast cancer, talk with your health care provider about genetic screening.  If you are at high risk for breast cancer, talk with your health care provider about having an MRI and a mammogram every year.  Breast cancer (BRCA) gene test is recommended for women who have family members with BRCA-related cancers. Results of the assessment will determine the need for genetic counseling and BRCA1 and for BRCA2 testing. BRCA-related cancers include these types: ? Breast. This occurs in males or females. ? Ovarian. ? Tubal. This may also be called fallopian tube cancer. ? Cancer of the abdominal or pelvic lining (peritoneal cancer). ? Prostate. ? Pancreatic.  Cervical, Uterine, and Ovarian Cancer Your health care provider may recommend that you be screened regularly for cancer of the pelvic organs. These include your ovaries, uterus, and vagina. This screening involves a pelvic exam, which includes checking for microscopic changes to the surface of your cervix (Pap test).  For women ages 21-65, health care providers may recommend a pelvic exam and a Pap test every three years. For women ages 79-65, they may recommend the Pap test and pelvic exam, combined with testing for human papilloma virus (HPV), every five years. Some types of HPV increase your risk of cervical cancer. Testing for HPV may also be done on women of any age who have unclear Pap test results.  Other health care providers may not recommend any screening for nonpregnant women who are considered low risk for pelvic cancer and have no symptoms. Ask your health care provider if a screening pelvic exam is right for you.  If you have had past treatment for cervical cancer or a condition that could lead to cancer, you need Pap tests and screening for cancer for at least 20 years after your treatment. If Pap tests have been discontinued for you, your risk factors (such as having a new sexual partner) need to be  reassessed to determine if you should start having screenings again. Some women have medical problems that increase the chance of getting cervical cancer. In these cases, your health care provider may recommend that you have screening and Pap tests more often.  If you have a family history of uterine cancer or ovarian cancer, talk with your health care provider about genetic screening.  If you have vaginal bleeding after reaching menopause, tell your health care provider.  There are currently no reliable tests available to screen for ovarian cancer.  Lung Cancer Lung cancer screening is recommended for adults 69-62 years old who are at high risk for lung cancer because of a history of smoking. A yearly low-dose CT scan of the lungs is recommended if you:  Currently smoke.  Have a history of at least 30 pack-years of smoking and you currently smoke or have quit within the past 15 years. A pack-year is smoking an average of one pack of cigarettes per day for one year.  Yearly screening should:  Continue until it has been 15 years since you quit.  Stop if you develop a health problem that would prevent you from having lung cancer treatment.  Colorectal Cancer  This type of cancer can be detected and can often be prevented.  Routine colorectal cancer screening usually begins at  age 42 and continues through age 45.  If you have risk factors for colon cancer, your health care provider may recommend that you be screened at an earlier age.  If you have a family history of colorectal cancer, talk with your health care provider about genetic screening.  Your health care provider may also recommend using home test kits to check for hidden blood in your stool.  A small camera at the end of a tube can be used to examine your colon directly (sigmoidoscopy or colonoscopy). This is done to check for the earliest forms of colorectal cancer.  Direct examination of the colon should be repeated every  5-10 years until age 71. However, if early forms of precancerous polyps or small growths are found or if you have a family history or genetic risk for colorectal cancer, you may need to be screened more often.  Skin Cancer  Check your skin from head to toe regularly.  Monitor any moles. Be sure to tell your health care provider: ? About any new moles or changes in moles, especially if there is a change in a mole's shape or color. ? If you have a mole that is larger than the size of a pencil eraser.  If any of your family members has a history of skin cancer, especially at a Masyn Fullam age, talk with your health care provider about genetic screening.  Always use sunscreen. Apply sunscreen liberally and repeatedly throughout the day.  Whenever you are outside, protect yourself by wearing long sleeves, pants, a wide-brimmed hat, and sunglasses.  What should I know about osteoporosis? Osteoporosis is a condition in which bone destruction happens more quickly than new bone creation. After menopause, you may be at an increased risk for osteoporosis. To help prevent osteoporosis or the bone fractures that can happen because of osteoporosis, the following is recommended:  If you are 46-71 years old, get at least 1,000 mg of calcium and at least 600 mg of vitamin D per day.  If you are older than age 55 but younger than age 65, get at least 1,200 mg of calcium and at least 600 mg of vitamin D per day.  If you are older than age 54, get at least 1,200 mg of calcium and at least 800 mg of vitamin D per day.  Smoking and excessive alcohol intake increase the risk of osteoporosis. Eat foods that are rich in calcium and vitamin D, and do weight-bearing exercises several times each week as directed by your health care provider. What should I know about how menopause affects my mental health? Depression may occur at any age, but it is more common as you become older. Common symptoms of depression  include:  Low or sad mood.  Changes in sleep patterns.  Changes in appetite or eating patterns.  Feeling an overall lack of motivation or enjoyment of activities that you previously enjoyed.  Frequent crying spells.  Talk with your health care provider if you think that you are experiencing depression. What should I know about immunizations? It is important that you get and maintain your immunizations. These include:  Tetanus, diphtheria, and pertussis (Tdap) booster vaccine.  Influenza every year before the flu season begins.  Pneumonia vaccine.  Shingles vaccine.  Your health care provider may also recommend other immunizations. This information is not intended to replace advice given to you by your health care provider. Make sure you discuss any questions you have with your health care provider. Document Released: 10/11/2005  Document Revised: 03/08/2016 Document Reviewed: 05/23/2015 Elsevier Interactive Patient Education  2018 [employer] Inc[employer]  

## 2017-09-30 NOTE — Progress Notes (Addendum)
Sandra Ellis [birth year] [id number]    History: 72 yo MWF G3P3 presents for breast and pelvic exam. 1995 TAH for DUB, no HRT. Hypertension, hypercholesterolemia, hypothyroidism, managed by PCP. Osteopenia but declines treatment or repeat DEXA. Normal Pap and mammogram history. 09/25/2017-ER visit for chest pain radiating to left shoulder, negative cardiac work up. 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 Had Pneumovax, Zostavax and has annual flu vaccines.  Past medical history, past surgical history, family history and social history were all reviewed and documented in the EPIC chart. 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 Originally from [place]. Father died at age 27, mother is living independently  age 70,  Diabetic on insulin  ROS:  A ROS was performed and pertinent positives and negatives are included.  Exam: Appeared well.   Vitals:   09/30/17 1033  BP: 110/78  Weight: 138 lb 9.6 oz (62.9 kg)  Height: 5\' 7"  (1.702 m)   Body mass index is 21.71 kg/m.   General appearance:  Normal Thyroid:  Symmetrical, normal in size, without palpable masses or nodularity. Respiratory  Auscultation:  Clear without wheezing or rhonchi Cardiovascular  Auscultation:  Regular rate, without rubs, murmurs or gallops  Edema/varicosities:  Not grossly evident Abdominal  Soft,nontender, without masses, guarding or rebound.  Liver/spleen:  No organomegaly noted  Hernia:  None appreciated  Skin  Inspection:  Grossly normal   Breasts: Examined lying and sitting.     Right: Without masses, retractions, discharge or axillary adenopathy.     Left: Without masses, retractions, discharge or axillary adenopathy. Gentitourinary   Inguinal/mons:  Normal without inguinal adenopathy  External genitalia:  Normal  BUS/Urethra/Skene's glands:  Normal  Vagina:  Atrophic   Cervix:  absent  Uterus:  absent  Adnexa/parametria:      Rt: Without masses or tenderness.   Lt: Without masses or tenderness.  Anus and perineum: Normal  Digital rectal exam: Normal sphincter tone without palpated masses or tenderness  Assessment/Plan:  72 y.o. MWF G3P3 for breast and pelvic exam with no complaints.   95 TAH for DUB, no HRT Vaginal atrophy Osteopenia declines treatment or repeat DEXA HTN/hypercholesterolemia/hyperthyroidism-PCP manages labs and meds 12/31/2016 - Left total hip arthroplasty.   Plan: Patient to continue using baby oil as vaginal lubricant with intercourse, this has worked for many years. Continue eating healthy and regular exercise swimming, stretches, and  stationary bike. SBE's, continue annual 3-D screening mammogram. Reviewed importance of weightbearing and balance exercises, home safety and fall prevention discussed.   Huel Cote [facility], 11:23 AM 09/30/2017

## 2017-10-20 DIAGNOSIS — M5416 Radiculopathy, lumbar region: Secondary | ICD-10-CM | POA: Diagnosis not present

## 2017-10-20 DIAGNOSIS — M62830 Muscle spasm of back: Secondary | ICD-10-CM | POA: Diagnosis not present

## 2017-10-20 DIAGNOSIS — M9903 Segmental and somatic dysfunction of lumbar region: Secondary | ICD-10-CM | POA: Diagnosis not present

## 2017-10-20 DIAGNOSIS — M9904 Segmental and somatic dysfunction of sacral region: Secondary | ICD-10-CM | POA: Diagnosis not present

## 2017-10-20 DIAGNOSIS — M9905 Segmental and somatic dysfunction of pelvic region: Secondary | ICD-10-CM | POA: Diagnosis not present

## 2017-10-23 DIAGNOSIS — Z1231 Encounter for screening mammogram for malignant neoplasm of breast: Secondary | ICD-10-CM | POA: Diagnosis not present

## 2017-11-13 DIAGNOSIS — G43909 Migraine, unspecified, not intractable, without status migrainosus: Secondary | ICD-10-CM | POA: Diagnosis not present

## 2017-11-13 DIAGNOSIS — E7800 Pure hypercholesterolemia, unspecified: Secondary | ICD-10-CM | POA: Diagnosis not present

## 2017-11-13 DIAGNOSIS — R7303 Prediabetes: Secondary | ICD-10-CM | POA: Diagnosis not present

## 2017-11-13 DIAGNOSIS — R600 Localized edema: Secondary | ICD-10-CM | POA: Diagnosis not present

## 2017-11-13 DIAGNOSIS — R002 Palpitations: Secondary | ICD-10-CM | POA: Diagnosis not present

## 2017-11-13 DIAGNOSIS — Z0000 Encounter for general adult medical examination without abnormal findings: Secondary | ICD-10-CM | POA: Diagnosis not present

## 2017-11-13 DIAGNOSIS — Z1389 Encounter for screening for other disorder: Secondary | ICD-10-CM | POA: Diagnosis not present

## 2017-11-13 DIAGNOSIS — E039 Hypothyroidism, unspecified: Secondary | ICD-10-CM | POA: Diagnosis not present

## 2017-11-27 DIAGNOSIS — M533 Sacrococcygeal disorders, not elsewhere classified: Secondary | ICD-10-CM | POA: Diagnosis not present

## 2017-11-27 DIAGNOSIS — M545 Low back pain: Secondary | ICD-10-CM | POA: Diagnosis not present

## 2017-12-15 ENCOUNTER — Ambulatory Visit (INDEPENDENT_AMBULATORY_CARE_PROVIDER_SITE_OTHER): Payer: Medicare Other | Admitting: Adult Health

## 2017-12-15 ENCOUNTER — Encounter: Payer: Self-pay | Admitting: Adult Health

## 2017-12-15 DIAGNOSIS — J069 Acute upper respiratory infection, unspecified: Secondary | ICD-10-CM

## 2017-12-15 MED ORDER — LEVALBUTEROL HCL 0.63 MG/3ML IN NEBU
0.6300 mg | INHALATION_SOLUTION | Freq: Once | RESPIRATORY_TRACT | Status: AC
  Administered 2017-12-15: 0.63 mg via RESPIRATORY_TRACT

## 2017-12-15 NOTE — Assessment & Plan Note (Signed)
URI vs AR flare  Steroid on hold  xopenex neb   Plan  Patient Instructions  Push Fluids and rest .  Zyrtec 10mg  daily As needed  Drainage  Mucinex Twice daily  As needed  Cough/congestion  Prednisone taper to have on hold if symptoms worse with wheezing  Follow up with Dr. Pennie Banter in 4-6 months and As needed   Please contact office for sooner follow up if symptoms do not improve or worsen or seek emergency care

## 2017-12-15 NOTE — Patient Instructions (Addendum)
Push Fluids and rest .  Zyrtec 10mg  daily As needed  Drainage  Mucinex Twice daily  As needed  Cough/congestion  Prednisone taper to have on hold if symptoms worse with wheezing  Follow up with Dr. Pennie Banter in 4-6 months and As needed   Please contact office for sooner follow up if symptoms do not improve or worsen or seek emergency care

## 2017-12-15 NOTE — Progress Notes (Signed)
@Patient  ID: Sandra Ellis, female    DOB: [birth year], 72 y.o.   MRN: [id number]  Chief Complaint  Patient presents with  . 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  From [place] endemic area for histoplasmosis -she is a Theme park manager   TEST  CT chest HRCT showed no ILD, very mild bronchiectasis , old granulomatous dz.  Labs showed IgE 196 , RAST neg. WBC was elevated (on steroids ) , eos ok  PFT 2017 .FEV1 100% , ratio 80 , FVC 94, DLCO 705.   12/15/2017 Acute OV : Cough  Patient presents for an acute office visit.  Patient complains of 3 days of sore throat , dry cough , tender glands  And tightness . Took albuterol which helped some . She took mucinex D but did not help and made her feel worse, seems to dry her out too much . Denies fever, discolored mucus , chest pain , orthopnea or edema.  Says she had been doing very well until this past few days.  Has a big church dinner this week does not want to be sick.       Allergies  Allergen Reactions  . Levaquin [Levofloxacin In D5w] Other (See Comments)    NUMBNESS/REDNESS  . Ciprofloxacin Rash and Other (See Comments)    NUMBNESS/REDNESS/RASH    Immunization History  Administered Date(s) Administered  . Influenza, High Dose Seasonal PF 06/16/2017  . Influenza,inj,Quad PF,6+ Mos 06/24/2015  . Influenza-Unspecified 06/02/2014    Past Medical History:  Diagnosis Date  . Elevated cholesterol   . H/O blood clots 1990    Tobacco History: Social History   Tobacco Use  Smoking Status Former Smoker  . Packs/day: 0.50  . Years: 8.00  . Pack years: 4.00  . Types: Cigarettes  . Last attempt to quit: 09/02/1973  . Years since quitting: 44.3  Smokeless Tobacco Never Used   Counseling given: Not Answered   Outpatient Encounter Medications as of 12/15/2017  Medication Sig  .  atenolol (TENORMIN) 25 MG tablet Take 12.5 mg by mouth at bedtime.   Marland Kitchen CINNAMON PO Take 1 tablet by mouth 1 day or 1 dose.  . Coenzyme Q10 (CO Q10) 100 MG CAPS Take 100 mg by mouth at bedtime.  . hydrochlorothiazide (HYDRODIURIL) 25 MG tablet Take 12.5 mg by mouth daily at 2 PM.   . levothyroxine (SYNTHROID, LEVOTHROID) 75 MCG tablet Take 75 mcg by mouth daily before breakfast.   . Magnesium 250 MG TABS Take 250 mg by mouth daily at 2 PM.  . Multiple Vitamins-Minerals (HAIR SKIN AND NAILS FORMULA PO) Take 1 tablet by mouth daily.  . Omega-3 Fatty Acids (FISH OIL) 1200 MG CAPS Take 1,200 mg by mouth 2 (two) times daily.  Marland Kitchen omeprazole-sodium bicarbonate (ZEGERID) 40-1100 MG capsule Take 1 capsule by mouth 2 (two) times daily.  Marland Kitchen OVER THE COUNTER MEDICATION Take 4 each by mouth 4 (four) times a week. Juicy juice multivitamin - chewable 2 vegetable and 2 fruit.   Marland Kitchen potassium gluconate 595 (99 K) MG TABS tablet Take 595 mg by mouth daily at 2 PM.  . Probiotic Product (PROBIOTIC PO) Take 2 tablets by mouth daily.  . rosuvastatin (CRESTOR) 5 MG tablet Take 5 mg by mouth every other day.   . tretinoin (RETIN-A) 0.1 % cream Apply 1 application topically at bedtime.  APPLIED TO FACE  . [DISCONTINUED] docusate sodium (COLACE) 100 MG capsule Take 1 capsule (100 mg total) by mouth 2 (two) times daily. (Patient not taking: Reported on 01/23/2017)  . [DISCONTINUED] ferrous sulfate (FERROUSUL) 325 (65 FE) MG tablet Take 1 tablet (325 mg total) by mouth 3 (three) times daily with meals. (Patient not taking: Reported on 01/23/2017)  . [DISCONTINUED] HYDROcodone-acetaminophen (NORCO) 7.5-325 MG tablet Take 1-2 tablets by mouth every 4 (four) hours as needed for moderate pain or severe pain. (Patient not taking: Reported on 09/30/2017)  . [DISCONTINUED] methocarbamol (ROBAXIN) 500 MG tablet Take 1 tablet (500 mg total) by mouth every 6 (six) hours as needed for muscle spasms. (Patient not taking: Reported on 01/23/2017)  .  [DISCONTINUED] ondansetron (ZOFRAN) 4 MG tablet Take 1 tablet (4 mg total) by mouth every 8 (eight) hours as needed for nausea or vomiting. (Patient not taking: Reported on 09/30/2017)  . [DISCONTINUED] polyethylene glycol (MIRALAX / GLYCOLAX) packet Take 17 g by mouth 2 (two) times daily. (Patient not taking: Reported on 01/23/2017)  . [DISCONTINUED] sucralfate (CARAFATE) 1 g tablet Take 1 tablet (1 g total) by mouth 4 (four) times daily -  with meals and at bedtime. (Patient not taking: Reported on 09/30/2017)  . [EXPIRED] levalbuterol (XOPENEX) nebulizer solution 0.63 mg    No facility-administered encounter medications on file as of 12/15/2017.      Review of Systems  Constitutional:   No  weight loss, night sweats,  Fevers, chills, fatigue, or  lassitude.  HEENT:   No headaches,  Difficulty swallowing,  Tooth/dental problems, or  Sore throat,                No sneezing, itching, ear ache, nasal congestion, post nasal drip,   CV:  No chest pain,  Orthopnea, PND, swelling in lower extremities, anasarca, dizziness, palpitations, syncope.   GI  No heartburn, indigestion, abdominal pain, nausea, vomiting, diarrhea, change in bowel habits, loss of appetite, bloody stools.   Resp:   No chest wall deformity  Skin: no rash or lesions.  GU: no dysuria, change in color of urine, no urgency or frequency.  No flank pain, no hematuria   MS:  No joint pain or swelling.  No decreased range of motion.  No back pain.    Physical Exam  BP 110/70 (BP Location: Left Arm, Cuff Size: Normal)   Pulse 61   Ht 5\' 7"  (1.702 m)   Wt 140 lb (63.5 kg)   SpO2 99%   BMI 21.93 kg/m   GEN: A/Ox3; pleasant , NAD, well nourished    HEENT:  [place]/AT,  EACs-clear, TMs-wnl, NOSE-clear drainage  THROAT-clear, no lesions, no postnasal drip or exudate noted.   NECK:  Supple w/ fair ROM; no JVD; normal carotid impulses w/o bruits; no thyromegaly or nodules palpated; no lymphadenopathy.    RESP  Clear  P & A; w/o,  wheezes/ rales/ or rhonchi. no accessory muscle use, no dullness to percussion  CARD:  RRR, no m/r/g, no peripheral edema, pulses intact, no cyanosis or clubbing.  GI:   Soft & nt; nml bowel sounds; no organomegaly or masses detected.   Musco: Warm bil, no deformities or joint swelling noted.   Neuro: alert, no focal deficits noted.    Skin: Warm, no lesions or rashes    Lab Results:   BNP No results found for: BNP  ProBNP No results found for: PROBNP  Imaging: No results found.   Assessment & Plan:  URI (upper respiratory infection) URI vs AR flare  Steroid on hold  xopenex neb   Plan  Patient Instructions  Push Fluids and rest .  Zyrtec 10mg  daily As needed  Drainage  Mucinex Twice daily  As needed  Cough/congestion  Prednisone taper to have on hold if symptoms worse with wheezing  Follow up with Dr. Pennie Banter in 4-6 months and As needed   Please contact office for sooner follow up if symptoms do not improve or worsen or seek emergency care          Rexene Edison, NP 12/15/2017

## 2017-12-15 NOTE — Progress Notes (Signed)
Reviewed, agree 

## 2017-12-16 ENCOUNTER — Telehealth: Payer: Self-pay | Admitting: Adult Health

## 2017-12-16 MED ORDER — PREDNISONE 10 MG PO TABS: ORAL_TABLET | ORAL | 0 refills | Status: DC

## 2017-12-16 MED ORDER — AZITHROMYCIN 250 MG PO TABS: ORAL_TABLET | ORAL | 0 refills | Status: DC

## 2017-12-16 NOTE — Addendum Note (Signed)
Addended by: Parke Poisson E on: 12/16/2017 12:46 PM   Modules accepted: Orders

## 2017-12-16 NOTE — Telephone Encounter (Signed)
Called and spoke with patient, she states that she is having tightness in her chest and coughing. Once she started taking the mucinex she started coughing up dark yellow mucus. Patient states that her fever at the highest was 100.3 before going to bed. No other symptoms.   Patient was just seen by TP on 4.15.19 with the following instructions.   Push Fluids and rest .  Zyrtec 10mg  daily As needed  Drainage  Mucinex Twice daily  As needed  Cough/congestion  Prednisone taper to have on hold if symptoms worse with wheezing  Follow up with Dr. Pennie Banter in 4-6 months and As needed   Please contact office for sooner follow up if symptoms do not improve or worsen or seek emergency care     TP please advise, thanks!

## 2017-12-16 NOTE — Telephone Encounter (Signed)
Sorry to hear that .  Begin Zpack #1 take as directed mucinex Twice daily   Fluids , rest , tylenol .  Please contact office for sooner follow up if symptoms do not improve or worsen or seek emergency care

## 2017-12-16 NOTE — Telephone Encounter (Signed)
Called and spoke with patient, advised her of TP response. Patient is aware and verbalized understanding. Nothing further needed.

## 2017-12-18 DIAGNOSIS — J208 Acute bronchitis due to other specified organisms: Secondary | ICD-10-CM | POA: Diagnosis not present

## 2017-12-22 ENCOUNTER — Telehealth: Payer: Self-pay | Admitting: Adult Health

## 2017-12-22 MED ORDER — BENZONATATE 200 MG PO CAPS: ORAL_CAPSULE | ORAL | 0 refills | Status: DC

## 2017-12-22 NOTE — Telephone Encounter (Signed)
Azithromycin remains in your system for 15.5 days after the last dose. If she continues to worsen or develops a change in symptoms have her contact the office. She needs to use the Mucinex Sandra Ellis Prescribed on 4/15 with a full glass of water. OK to renew Tessalon, 200 mg at bedtime, dispense 14 tabs only. Do not drive if sleepy. Have her try the Delsym during the day.  Remind her to use Zyrtec as PND may be contributing to her cough. Thanks so much

## 2017-12-22 NOTE — Telephone Encounter (Signed)
Spoke with patient. She is aware of SG's recs.   Will go ahead and call in the Tessalon perles.   Nothing else needed at time of call.

## 2017-12-22 NOTE — Telephone Encounter (Signed)
Called spoke with patient who reported no real improvement since her 4.15.19 office visit with TP.  Pt did state that her symptoms "weren't that bad" and she "probably didn't need to be seen yet" when she came for appt.  Pt was given pred taper at the visit with TP, then called the next day for worsening symptoms and received a zpak.  She finished this yesterday.  Her mucus has now gone from green to dark yellow, has a headache and "a tiny bit" of wheezing - she is questioning if the zpak needs to be extended.  She is also asking for something for her cough - Tessalon (she does not want a narcotic).  Asked patient if she has tried anything OTC like Delsym to help with her cough - she has not and stated that she is afraid to take anything OTC although TP recommended Mucinex.  Pt denies any wheezing any dyspnea, chest tightness, f/c/s, hemoptysis, PND or chest pain. [facility] [address] ALLERGIES: Levaquin, Cipro  TP not in office today Former AD patient, not currently established with an MD Will route to Eric Form NP for recommendations.  Please advise, thank you.  Per 4.15.19 office visit w/ TP: Patient Instructions  Push Fluids and rest .  Zyrtec 10mg  daily As needed  Drainage  Mucinex Twice daily  As needed  Cough/congestion  Prednisone taper to have on hold if symptoms worse with wheezing  Follow up with Dr. Pennie Banter in 4-6 months and As needed   Please contact office for sooner follow up if symptoms do not improve or worsen or seek emergency care

## 2018-01-12 DIAGNOSIS — M7061 Trochanteric bursitis, right hip: Secondary | ICD-10-CM | POA: Diagnosis not present

## 2018-01-12 DIAGNOSIS — M545 Low back pain: Secondary | ICD-10-CM | POA: Diagnosis not present

## 2018-01-12 DIAGNOSIS — M25551 Pain in right hip: Secondary | ICD-10-CM | POA: Diagnosis not present

## 2018-01-29 DIAGNOSIS — D485 Neoplasm of uncertain behavior of skin: Secondary | ICD-10-CM | POA: Diagnosis not present

## 2018-01-29 DIAGNOSIS — L820 Inflamed seborrheic keratosis: Secondary | ICD-10-CM | POA: Diagnosis not present

## 2018-01-29 DIAGNOSIS — L814 Other melanin hyperpigmentation: Secondary | ICD-10-CM | POA: Diagnosis not present

## 2018-01-30 DIAGNOSIS — D485 Neoplasm of uncertain behavior of skin: Secondary | ICD-10-CM | POA: Diagnosis not present

## 2018-01-30 DIAGNOSIS — L814 Other melanin hyperpigmentation: Secondary | ICD-10-CM | POA: Diagnosis not present

## 2018-02-07 DIAGNOSIS — R072 Precordial pain: Secondary | ICD-10-CM | POA: Diagnosis not present

## 2018-02-07 DIAGNOSIS — R0602 Shortness of breath: Secondary | ICD-10-CM | POA: Diagnosis not present

## 2018-02-07 DIAGNOSIS — R05 Cough: Secondary | ICD-10-CM | POA: Diagnosis not present

## 2018-02-07 DIAGNOSIS — R079 Chest pain, unspecified: Secondary | ICD-10-CM | POA: Diagnosis not present

## 2018-02-07 DIAGNOSIS — R001 Bradycardia, unspecified: Secondary | ICD-10-CM | POA: Diagnosis not present

## 2018-02-08 DIAGNOSIS — R001 Bradycardia, unspecified: Secondary | ICD-10-CM | POA: Diagnosis not present

## 2018-02-09 DIAGNOSIS — J209 Acute bronchitis, unspecified: Secondary | ICD-10-CM | POA: Diagnosis not present

## 2018-02-16 ENCOUNTER — Ambulatory Visit (INDEPENDENT_AMBULATORY_CARE_PROVIDER_SITE_OTHER): Payer: Medicare Other | Admitting: Internal Medicine

## 2018-02-16 ENCOUNTER — Inpatient Hospital Stay: Payer: Medicare Other | Admitting: Acute Care

## 2018-02-16 ENCOUNTER — Encounter: Payer: Self-pay | Admitting: Internal Medicine

## 2018-02-16 VITALS — BP 124/82 | HR 77 | Ht 67.0 in | Wt 141.0 lb

## 2018-02-16 DIAGNOSIS — J479 Bronchiectasis, uncomplicated: Secondary | ICD-10-CM

## 2018-02-16 DIAGNOSIS — R05 Cough: Secondary | ICD-10-CM

## 2018-02-16 DIAGNOSIS — R058 Other specified cough: Secondary | ICD-10-CM

## 2018-02-16 NOTE — Patient Instructions (Addendum)
If not better to your satisfaction the next step in the work up is a sinus CT  - call to schedule @ [phone]  this and return with all active medications.   GERD (REFLUX)  is an extremely common cause of respiratory symptoms just like yours , many times with no obvious heartburn at all.    It can be treated with medication, but also with lifestyle changes including elevation of the head of your bed (ideally with 6 inch  bed blocks),  Smoking cessation, avoidance of late meals, excessive alcohol, and avoid fatty foods, chocolate, peppermint, colas, red wine, and acidic juices such as orange juice.  NO MINT OR MENTHOL PRODUCTS SO NO COUGH DROPS   USE SUGARLESS CANDY INSTEAD (Jolley ranchers or Stover's or Life Savers) or even ice chips will also do - the key is to swallow to prevent all throat clearing. NO OIL BASED VITAMINS - use powdered substitutes.  Follow up is as needed - add: be sure she has had prevnar and chart it in Epic if knows about when it was given or check with Dr Doreene Adas office

## 2018-02-16 NOTE — Progress Notes (Signed)
@Patient  ID: Sandra Ellis, female    DOB: 08/22/1946    MRN: [id number]     Referring provider: Shirline Frees, MD  Brief patient profile:  45 yowf quit smoking 1975  followed for pulmonary nodules compatible with old granulomatous disease, PPD negative.  From [place] endemic area for histoplasmosis -she is a [employer] manager     12/15/2017 NP eval  Acute OV : Cough  Patient presents for an acute office visit.  Patient complains of 3 days of sore throat , dry cough , tender glands  And tightness . Took albuterol which helped some . She took mucinex D but did not help and made her feel worse, seems to dry her out too much . Denies fever, discolored mucus , chest pain , orthopnea or edema.  Says she had been doing very well until this past few days.  Has a big church dinner this week does not want to be sick.  rec Push Fluids and rest .  Zyrtec 10mg  daily As needed  Drainage  Mucinex Twice daily  As needed  Cough/congestion  Prednisone taper to have on hold if symptoms worse with wheezing  Follow up with Dr. Pennie Banter in 4-6 months and As needed   Please contact office for sooner follow up if symptoms do not improve or worsen or seek emergency care    Took zpak, never took prednisone or zyrtec > sputum turned dark > took extra zmax x 2 days and mucus turned somewhat lighter  Then 2 weeks later cough turned  dry but  persisted day and noct then much worse for sev days > [facility] ER 02/07/18 with a nl CTa and dx of bronchitis > doxy x 10 days/ rx prednisone x 5 days > much better "for the first time"    02/16/2018  Post er  f/u ov/Wert re: cough  Chief Complaint  Patient presents with  . Hospitalization Follow-up    Went to ED in [place] on 02/07/18 with cough- dxed with bronchitis and put on doxy. She states the cough is much improved.    Dyspnea:   Not limited by breathing from desired activities  But no t aerobic Cough: still some dry coughing with voice use only, no other cough/ using lots of cough drops   Sleep: fine now      No obvious day to day or daytime variability or assoc excess/ purulent sputum or mucus plugs or hemoptysis or cp or chest tightness, subjective wheeze or overt sinus or hb symptoms. No unusual exposure hx or h/o childhood pna/ asthma or knowledge of premature birth.  Sleeping  Ok pillow  without nocturnal  or early am exacerbation  of respiratory  c/o's or need for noct saba. Also denies any obvious fluctuation of symptoms with weather or environmental changes or other aggravating or alleviating factors except as outlined above   Current Allergies, Complete Past Medical History, Past Surgical History, Family History, and Social History were reviewed in [facility] record.  ROS  The following are not active complaints unless bolded Hoarseness, sore throat, dysphagia, dental problems from root canal bottom molar on L  itching, sneezing,  nasal congestion or discharge of excess mucus or purulent secretions, ear ache,   fever, chills, sweats, unintended wt loss or wt gain, classically pleuritic or exertional cp,  orthopnea pnd or arm/hand swelling  or leg swelling, presyncope, palpitations, abdominal pain, anorexia, nausea, vomiting, diarrhea  or change in bowel habits or change in bladder habits, change  in stools or change in urine, dysuria, hematuria,  rash, arthralgias, visual complaints, headache, numbness, weakness or ataxia or problems with walking or coordination,  change in mood or  memory.        Current Meds  Medication Sig  . atenolol (TENORMIN) 25 MG tablet Take 12.5 mg by mouth at bedtime.   Marland Kitchen CINNAMON PO Take 1 tablet by mouth 1 day or 1 dose.  . Coenzyme Q10 (CO Q10) 100 MG CAPS Take 100 mg by mouth at bedtime.  Marland Kitchen doxycycline (VIBRA-TABS) 100 MG tablet Take 100 mg by mouth every 12 (twelve) hours.  . hydrochlorothiazide (HYDRODIURIL) 25 MG tablet Take 12.5 mg by mouth daily at 2 PM.   . levothyroxine (SYNTHROID, LEVOTHROID) 75 MCG tablet  Take 75 mcg by mouth daily before breakfast.   . Magnesium 250 MG TABS Take 250 mg by mouth daily at 2 PM.  . Multiple Vitamins-Minerals (HAIR SKIN AND NAILS FORMULA PO) Take 1 tablet by mouth daily.  . Omega-3 Fatty Acids (FISH OIL) 1200 MG CAPS Take 1,200 mg by mouth 2 (two) times daily.  . potassium gluconate 595 (99 K) MG TABS tablet Take 595 mg by mouth daily at 2 PM.  . Probiotic Product (PROBIOTIC PO) Take 2 tablets by mouth daily.  . rosuvastatin (CRESTOR) 5 MG tablet Take 5 mg by mouth every other day.   . tretinoin (RETIN-A) 0.1 % cream Apply 1 application topically at bedtime. APPLIED TO FACE       PEx  amb wf nad / occ throat clearing   Wt Readings from Last 3 Encounters:  02/16/18 141 lb (64 kg)  12/15/17 140 lb (63.5 kg)  09/30/17 138 lb 9.6 oz (62.9 kg)     Vital signs reviewed - Note on arrival 02 sats  95% on RA      HEENT: nl dentition, turbinates bilaterally, and min cobblestoning of oropharynx.  Nl external ear canals without cough reflex   NECK :  without JVD/Nodes/TM/ nl carotid upstrokes bilaterally   LUNGS: no acc muscle use,  Nl contour chest which is clear to A and P bilaterally without cough on insp or exp maneuvers   CV:  RRR  no s3 or murmur or increase in P2, and no edema   ABD:  soft and nontender with nl inspiratory excursion in the supine position. No bruits or organomegaly appreciated, bowel sounds nl  MS:  Nl gait/ ext warm without deformities, calf tenderness, cyanosis or clubbing No obvious joint restrictions   SKIN: warm and dry without lesions    NEURO:  alert, approp, nl sensorium with  no motor or cerebellar deficits apparent.        I personally reviewed images and agree with radiology impression as follows:   Chest CTa  02/07/18 No active cardiopulmonary disease. Prior granulomatous disease.      TESTS reviewed 02/16/2018  CT chest HRCT  01/18/16  : no ILD, very mild cylindrical  bronchiectasis , old granulomatous dz.    02/21/18  IgE 196 , RAST neg. WBC was elevated (on steroids ) , eos ok  PFT /14//2017 .FEV1 100% , ratio 80 , FVC 94, DLCO 705.   Assessment & Plan:

## 2018-02-17 ENCOUNTER — Encounter: Payer: Self-pay | Admitting: Internal Medicine

## 2018-02-17 ENCOUNTER — Telehealth: Payer: Self-pay | Admitting: *Deleted

## 2018-02-17 DIAGNOSIS — R058 Other specified cough: Secondary | ICD-10-CM | POA: Insufficient documentation

## 2018-02-17 DIAGNOSIS — R05 Cough: Secondary | ICD-10-CM | POA: Insufficient documentation

## 2018-02-17 NOTE — Telephone Encounter (Signed)
-----   Message from Tanda Rockers, MD sent at 02/17/2018  6:07 AM EDT -----  - add: be sure she has had prevnar and chart it in Epic if knows about when it was given or check with Dr Doreene Adas office

## 2018-02-17 NOTE — Assessment & Plan Note (Signed)
Onset around 12/12/17 - rx pred/ doxy 02/07/18 and improved   The most common causes of chronic cough in immunocompetent adults include the following: upper airway cough syndrome (UACS), previously referred to as postnasal drip syndrome (PNDS), which is caused by variety of rhinosinus conditions; (2) asthma; (3) GERD; (4) chronic bronchitis from cigarette smoking or other inhaled environmental irritants; (5) nonasthmatic eosinophilic bronchitis; and (6) bronchiectasis.   These conditions, singly or in combination, have accounted for up to 94% of the causes of chronic cough in prospective studies.   Other conditions have constituted no >6% of the causes in prospective studies These have included bronchogenic carcinoma, chronic interstitial pneumonia, sarcoidosis, left ventricular failure, ACEI-induced cough, and aspiration from a condition associated with pharyngeal dysfunction.    Chronic cough is often simultaneously caused by more than one condition. A single cause has been found from 38 to 82% of the time, multiple causes from 18 to 62%. Multiply caused cough has been the result of three diseases up to 42% of the time.    Although she has bronchiectasis on prior ct, it is mild and her cough was completely none productive worse with voice use typical of Upper airway cough syndrome (previously labeled PNDS),  is so named because it's frequently impossible to sort out how much is  CR/sinusitis with freq throat clearing (which can be related to primary GERD)   vs  causing  secondary (" extra esophageal")  GERD from wide swings in gastric pressure that occur with throat clearing, often  promoting self use of mint and menthol lozenges that reduce the lower esophageal sphincter tone and exacerbate the problem further in a cyclical fashion.   These are the same pts (now being labeled as having "irritable larynx syndrome" by some cough centers) who not infrequently have a history of having failed to tolerate  ace inhibitors,  dry powder inhalers or biphosphonates or report having atypical/extraesophageal reflux symptoms that don't respond to standard doses of PPI  and are easily confused as having aecopd or asthma flares by even experienced allergists/ pulmonologists (myself included).   She did appear to respond to prednisone so need to also keep in mind cough variant asthma and eos bronchitis/ rhinitis should it flare back up in next few weeks off steroids and low threshold for sinus ct then methacholine challenge if this is the case.  In the meantime rec max dietary rx for gerd as chronic cough is assoc with secondary gerd triggering cyclical cough pattern.  F/u here is prn    I had an extended discussion with the patient reviewing all relevant studies completed to date and  lasting 25 minutes of a 40  minute transition of care office  visit with pt new to me    re    non-specific but potentially very serious refractory respiratory symptoms of uncertain and potentially multiple  etiologies.   Each maintenance medication was reviewed in detail including most importantly the difference between maintenance and prns and under what circumstances the prns are to be triggered using an action plan format that is not reflected in the computer generated alphabetically organized AVS.    Please see AVS for specific instructions unique to this office visit that I personally wrote and verbalized to the the pt in detail and then reviewed with pt  by my nurse highlighting any changes in therapy/plan of care  recommended at today's visit.

## 2018-02-17 NOTE — Telephone Encounter (Signed)
Spoke with pt and confirmed prevnar 13 done with PCP on 10/12/15

## 2018-02-17 NOTE — Telephone Encounter (Signed)
Pt states she did have a second vaccination on 10/12/2015. Cb is [phone].

## 2018-02-17 NOTE — Telephone Encounter (Signed)
Spoke with the pt  She is going to check with Dr. Doreene Adas office and let us know  Will hold to f/u on

## 2018-02-17 NOTE — Assessment & Plan Note (Signed)
No evidence of a flare at this point  Does need to complete pneumococcal vaccinations if not already done so

## 2018-02-19 ENCOUNTER — Encounter: Payer: Self-pay | Admitting: Cardiovascular Disease

## 2018-02-19 ENCOUNTER — Ambulatory Visit (INDEPENDENT_AMBULATORY_CARE_PROVIDER_SITE_OTHER): Payer: Medicare Other | Admitting: Cardiovascular Disease

## 2018-02-19 VITALS — BP 152/74 | HR 51 | Ht 67.0 in | Wt 140.0 lb

## 2018-02-19 DIAGNOSIS — E039 Hypothyroidism, unspecified: Secondary | ICD-10-CM

## 2018-02-19 DIAGNOSIS — Z8669 Personal history of other diseases of the nervous system and sense organs: Secondary | ICD-10-CM

## 2018-02-19 DIAGNOSIS — I10 Essential (primary) hypertension: Secondary | ICD-10-CM

## 2018-02-19 DIAGNOSIS — R072 Precordial pain: Secondary | ICD-10-CM | POA: Diagnosis not present

## 2018-02-19 DIAGNOSIS — R079 Chest pain, unspecified: Secondary | ICD-10-CM | POA: Diagnosis not present

## 2018-02-19 DIAGNOSIS — E785 Hyperlipidemia, unspecified: Secondary | ICD-10-CM

## 2018-02-19 MED ORDER — HYDROCHLOROTHIAZIDE 25 MG PO TABS: 12.5000 mg | ORAL_TABLET | ORAL | Status: DC | PRN

## 2018-02-19 NOTE — Progress Notes (Signed)
Cardiology Office Note    Date:  02/20/2018   ID:  Sandra Ellis, DOB 1946-03-05, MRN [id number]  PCP:  Shirline Frees, MD  Cardiologist:  Shelva Majestic, MD   New cardiology evaluation referred by [facility] emergency room and Dr.Harris for abnormalECG  History of Present Illness:  Emmett Arntz is a 72 y.o. female who is referred by [facility] emergency room after the patient was evaluated by Dr.Charles Graeub   Ms. Thanh Pomerleau is followed by Dr. Shirline Frees for primary care.  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  She has a remote history of migraine headaches and was evaluated at the [facility] 25 years ago and was placed on atenolol with significant benefit and resolution of her migraine headaches.  She has a history of occasional swelling for which she has taken HCTZ.  She has been on rosuvastatin 5 mg every other day for mild hyperlipidemia.  She has a history of spastic colon and also a history of hypothyroidism.  On February 07, 2018 she was evaluated at [facility] emergency room with complaints of cough, pleuritic chest pain, and shortness of breath.  He states that there was an occasion where she woke up and it was difficult to breathe and she had experienced associated sweating during this episode 2 weeks previously.  Previously she was diagnosed with bronchitis and had been treated with Z-Pak with some improvement but her cough had recurred.  Initially, 2 years ago she also had an episode of bronc Nilda Calamity that was hard to resolve.  During her evaluation in the emergency room, her ECG is unchanged from her prior ECG in January 2019 and suggested inferior Q waves and poor anterior R wave progression.  Her heart score was 3.  A CT of her chest did not show acute cardiopulmonary disease.  Small liver hypodensities were noted, too small to characterize were felt most likely cysts.  Chest x-ray was unremarkable but suggested prior  granulomatous disease.  It was recommended that the patient have an echocardiogram.  Time she does admit to some vague chest pressure which is not characteristically exertional.  She presents for evaluation.  Past Medical History:  Diagnosis Date  . Elevated cholesterol   . H/O blood clots 1990    Past Surgical History:  Procedure Laterality Date  . KNEE SURGERY     cyst removed  . tonisllectomy    . 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 atenolol (TENORMIN) 25 MG tablet Take 12.5 mg by mouth at bedtime.     . cholecalciferol (VITAMIN D) 400 units TABS tablet Take 400 Units by mouth.    Marland Kitchen CINNAMON PO Take 1 tablet by mouth 1 day or 1 dose.    . Coenzyme Q10 (CO Q-10) 300 MG CAPS Take 300 mg by mouth daily.    Marland Kitchen levothyroxine (SYNTHROID, LEVOTHROID) 75 MCG tablet Take 75 mcg by mouth daily before breakfast.     . Magnesium 250 MG TABS Take 250 mg by mouth daily at 2 PM.    . Omega-3 Fatty Acids (FISH OIL) 1200 MG CAPS Take 1,200 mg by mouth 2 (two) times daily.    Marland Kitchen  potassium gluconate 595 (99 K) MG TABS tablet Take 595 mg by mouth daily at 2 PM.    . Probiotic Product (PROBIOTIC PO) Take 2 tablets by mouth daily.    . rosuvastatin (CRESTOR) 5 MG tablet Take 5 mg by mouth every other day.     . tretinoin (RETIN-A) 0.1 % cream Apply 1 application topically at bedtime. APPLIED TO FACE    . hydrochlorothiazide (HYDRODIURIL) 25 MG tablet Take 12.5 mg by mouth daily at 2 PM.     . Coenzyme Q10 (CO Q10) 100 MG CAPS Take 300 mg by mouth at bedtime.     Marland Kitchen doxycycline (VIBRA-TABS) 100 MG tablet Take 100 mg by mouth every 12 (twelve) hours.  0  . Multiple Vitamins-Minerals (HAIR SKIN AND NAILS FORMULA PO) Take 1 tablet  by mouth daily.     No facility-administered medications prior to visit.      Allergies:   Levaquin [levofloxacin in d5w] and Ciprofloxacin   Social History   Socioeconomic History  . Marital status: Married    Spouse name: Not on file  . Number of children: 3  . Years of education: Not on file  . Highest education level: Not on file  Occupational History  . Occupation: Audiological scientist  . Financial resource strain: Not on file  . Food insecurity:    Worry: Not on file    Inability: Not on file  . Transportation needs:    Medical: Not on file    Non-medical: Not on file  Tobacco Use  . Smoking status: Former Smoker    Packs/day: 0.50    Years: 8.00    Pack years: 4.00    Types: Cigarettes    Last attempt to quit: 09/02/1973    Years since quitting: 44.4  . Smokeless tobacco: Never Used  Substance and Sexual Activity  . Alcohol use: No    Alcohol/week: 0.0 oz  . Drug use: No  . Sexual activity: Yes    Birth control/protection: Surgical    Comment: FIRST INTERCORSE AGE 64- ONE SEXUAL PARTNER   Lifestyle  . Physical activity:    Days per week: Not on file    Minutes per session: Not on file  . Stress: Not on file  Relationships  . Social connections:    Talks on phone: Not on file    Gets together: Not on file    Attends religious service: Not on file    Active member of club or organization: Not on file    Attends meetings of clubs or organizations: Not on file    Relationship status: Not on file  Other Topics Concern  . Not on file  Social History Narrative  . Not on file     Family History:  The patient's family history includes Asthma in her mother; Diabetes in her mother.   ROS General: Negative; No fevers, chills, or night sweats;  HEENT: Negative; No changes in vision or hearing, sinus congestion, difficulty swallowing Pulmonary: Negative; No cough, wheezing, shortness of breath, hemoptysis Cardiovascular: Negative; No chest pain, presyncope,  syncope, palpitations GI: Negative; No nausea, vomiting, diarrhea, or abdominal pain GU: She had prior kidney stone Musculoskeletal: Negative; no myalgias, joint pain, or weakness Hematologic/Oncology: Negative; no easy bruising, bleeding Endocrine: Negative; no heat/cold intolerance; no diabetes Neuro: Negative; no changes in balance, headaches Skin: Negative; No rashes or skin lesions Psychiatric: Negative; No behavioral problems, depression Sleep: Negative; No snoring, daytime sleepiness, hypersomnolence, bruxism, restless legs, hypnogognic hallucinations,  no cataplexy An Epworth  sleepiness scale score was calculated in the office today and this endorsed at 3 arguing against daytime sleepiness. Other comprehensive 14 point system review is negative.   PHYSICAL EXAM:   VS:  BP (!) 152/74   Pulse (!) 51   Ht _0  (1.702 m)   Wt 140 lb (63.5 kg)   BMI 21.93 kg/m     Repeat blood pressure by me was 140/70 supine and 140/70 standing.  Wt Readings from Last 3 Encounters:  02/19/18 140 lb (63.5 kg)  02/16/18 141 lb (64 kg)  12/15/17 140 lb (63.5 kg)    General: Alert, oriented, no distress.  Skin: normal turgor, no rashes, warm and dry HEENT: Normocephalic, atraumatic. Pupils equal round and reactive to light; sclera anicteric; extraocular muscles intact; Fundi no arteriolar narrowing, disc flat.  No hemorrhages or exudates. Nose without nasal septal hypertrophy Mouth/Parynx benign; Mallinpatti scale 3 Neck: No JVD, no carotid bruits; normal carotid upstroke Lungs: clear to ausculatation and percussion; no wheezing or rales Chest wall: without tenderness to palpitation Heart: PMI not displaced, RRR, s1 s2 normal, 1/6 systolic murmur, no diastolic murmur, no rubs, gallops, thrills, or heaves Abdomen: soft, nontender; no hepatosplenomehaly, BS+; abdominal aorta nontender and not dilated by palpation. Back: no CVA tenderness Pulses 2+ Musculoskeletal: full range of motion, normal  strength, no joint deformities Extremities: no clubbing cyanosis or edema, Homan's sign negative  Neurologic: grossly nonfocal; Cranial nerves grossly wnl Psychologic: Normal mood and affect   Studies/Labs Reviewed:   EKG:  EKG is ordered today. ECG (independently read by me): Sinus bradycardia at 51 bpm.  Q waves in lead III.  Poor anterior R wave progression.  Normal intervals.  No ectopy.  Recent Labs: BMP Latest Ref Rng & Units 01/23/2017 01/01/2017 12/27/2016  Glucose 65 - 99 mg/dL 105(H) 176(H) 104(H)  BUN 6 - 20 mg/dL _1 Creatinine 0.44 - 1.00 mg/dL 0.77 0.71 0.87  Sodium 135 - 145 mmol/L 128(L) 133(L) 134(L)  Potassium 3.5 - 5.1 mmol/L 3.8 3.7 4.2  Chloride 101 - 111 mmol/L 94(L) 99(L) 98(L)  CO2 22 - 32 mmol/L _2 Calcium 8.9 - 10.3 mg/dL 9.5 8.8(L) 9.5     Hepatic Function Latest Ref Rng & Units 01/23/2017 02/22/2016 02/25/2013  Total Protein 6.5 - 8.1 g/dL 6.7 7.0 7.4  Albumin 3.5 - 5.0 g/dL 3.9 4.3 3.5  AST 15 - 41 U/L _3 ALT 14 - 54 U/L 18 34 16  Alk Phosphatase 38 - 126 U/L 88 55 64  Total Bilirubin 0.3 - 1.2 mg/dL 0.6 0.4 0.3  Bilirubin, Direct 0.0 - 0.3 mg/dL - 0.0 -    CBC Latest Ref Rng & Units 01/23/2017 01/01/2017 12/27/2016  WBC 4.0 - 10.5 K/uL 9.5 15.4(H) 7.2  Hemoglobin 12.0 - 15.0 g/dL 12.8 11.8(L) 14.2  Hematocrit 36.0 - 46.0 % 37.4 34.4(L) 40.9  Platelets 150 - 400 K/uL 416(H) 246 273   Lab Results  Component Value Date   MCV 90.6 01/23/2017   MCV 90.8 01/01/2017   MCV 90.5 12/27/2016   Lab Results  Component Value Date   No results found for: HGBA1C   BNP No results found for: BNP  ProBNP No results found for: PROBNP   Lipid Panel  No results found for: CHOL, TRIG, HDL, CHOLHDL, VLDL, LDLCALC, LDLDIRECT   RADIOLOGY: No results found.   Additional studies/ records that were reviewed today include:  Reviewed the records from  [place] emergency room.   ASSESSMENT:    1. Essential hypertension, benign   2.  Chest pain, unspecified type   3. Hyperlipidemia, unspecified hyperlipidemia type   4. Precordial pain   5. Hypothyroidism, unspecified type   6. History of migraine headaches      PLAN:  Ms. Arielys Wandersee is a very pleasant 72 year old female who has a history of high-grade headaches for which she has been on atenolol with significant benefit as well as a history of mild intermittent swelling for which she has taken hydrochlorothiazide.  She also has a history of mild hyperlipidemia currently on Crestor 5 mg daily.  She is on levothyroxine for hypothyroidism laboratory in December 2018 showed a LDL cholesterol at 87 with total cholesterol 192.  She has had issues with bronchitis in the past and recently was treated for bronchitis for several weeks prior to her presentation to [facility] emergency room with complaints of cough, shortness of breath, fever chills.  Two weeks previously she had experienced night sweats.  Her imaging studies were reviewed.  Her CT scan did not show any acute pulmonary disease although incidentally there were several small liver hypodensities most likely cysts too small to characterize.  Her ECG showed a small Q waves in 3 and poor anterior R wave progression.  This may be potentially related to lead placement.  She denies any awareness of prior myocardial infarction.  I recommended she undergo an echo Doppler study to assess systolic and diastolic function.  I also have recommended she undergo a coronary CT angio to assess for aortic as well as coronary atherosclerosis.  If present, lipid will need to be more aggressively treated with target LDL less than 70.  I have recommended she change her HCTZ to as needed since laboratory in [facility] was notable for sodium of 130 and its possible this may be contributed by thiazide diuretic.  I will reassess chemistry profile and lipid studies.  I will see her in 6 weeks for reevaluation.   Medication Adjustments/Labs and Tests  Ordered: Current medicines are reviewed at length with the patient today.  Concerns regarding medicines are outlined above.  Medication changes, Labs and Tests ordered today are listed in the Patient Instructions below. Patient Instructions  Medication Instructions: CHANGE Hydrochlorothiazide to as needed for swelling  If you need a refill on your cardiac medications before your next appointment, please call your pharmacy.   Labwork: Your physician recommends that you return for lab work in a few weeks for a FASTING lipid and CMET.   Procedures/Testing: Your physician has requested that you have an echocardiogram. Echocardiography is a painless test that uses sound waves to create images of your heart. It provides your doctor with information about the size and shape of your heart and how well your heart's chambers and valves are working. This procedure takes approximately one hour. There are no restrictions for this procedure. This will take place at [address].  Your physician has requested that you have cardiac CT. Cardiac computed tomography (CT) is a painless test that uses an x-ray machine to take clear, detailed pictures of your heart. For further information please visit [facility]uge[facility]iest[facility][email or web address]. Please follow instruction sheet as given.   Follow-Up: Your physician wants you to follow-up in 6 weeks after the cardiac ct with Dr. Claiborne Billings. You will receive a reminder letter in the mail two months in advance. If you don't receive a letter, please call our office at [phone] to  schedule this follow-up appointment.   Thank you for choosing [facility] at [facility]!!       Please arrive at the [facility] main entrance of [facility] at      AM (30-45 minutes prior to test start time)  [facility] [place], [place] [address] [phone]  Proceed to the [facility] Radiology Department (First Floor).  Please follow these instructions  carefully (unless otherwise directed):  On the Night Before the Test: . Drink plenty of water. . Do not consume any caffeinated/decaffeinated beverages or chocolate 12 hours prior to your test. . Do not take any antihistamines 12 hours prior to your test.   On the Day of the Test: . Drink plenty of water. Do not drink any water within one hour of the test. . Do not eat any food 4 hours prior to the test. . You may take your regular medications prior to the test. . IF NOT ON A BETA BLOCKER - Take 50 mg of lopressor (metoprolol) one hour before the test. . HOLD Furosemide morning of the test.  After the Test: . Drink plenty of water. . After receiving IV contrast, you may experience a mild flushed feeling. This is normal. . On occasion, you may experience a mild rash up to 24 hours after the test. This is not dangerous. If this occurs, you can take Benadryl 25 mg and increase your fluid intake. . If you experience trouble breathing, this can be serious. If it is severe call 911 IMMEDIATELY. If it is mild, please call our office. . If you take any of these medications: Glipizide/Metformin, Avandament, Glucavance, please do not take 48 hours after completing test.     Signed, Shelva Majestic, MD  02/20/2018 3:10 PM    [place] [address], [place], [place], [place]  [address] Phone: [phone]

## 2018-02-19 NOTE — Patient Instructions (Addendum)
Medication Instructions: CHANGE Hydrochlorothiazide to as needed for swelling  If you need a refill on your cardiac medications before your next appointment, please call your pharmacy.   Labwork: Your physician recommends that you return for lab work in a few weeks for a FASTING lipid and CMET.   Procedures/Testing: Your physician has requested that you have an echocardiogram. Echocardiography is a painless test that uses sound waves to create images of your heart. It provides your doctor with information about the size and shape of your heart and how well your heart's chambers and valves are working. This procedure takes approximately one hour. There are no restrictions for this procedure. This will take place at [address], [address].  Your physician has requested that you have cardiac CT. Cardiac computed tomography (CT) is a painless test that uses an x-ray machine to take clear, detailed pictures of your heart. For further information please visit [facility]uge[facility]iesta[facility]tn. Please follow instruction sheet as given.   Follow-Up: Your physician wants you to follow-up in 6 weeks after the cardiac ct with Dr. Claiborne Billings. You will receive a reminder letter in the mail two months in advance. If you don't receive a letter, please call our office at [phone] to schedule this follow-up appointment.   Thank you for choosing [facility] at [facility]!!       Please arrive at the [facility] main entrance of [facility] at      AM (30-45 minutes prior to test start time)  [facility] [place], [place] [address] [phone]  Proceed to the [facility] Radiology Department (First Floor).  Please follow these instructions carefully (unless otherwise directed):  On the Night Before the Test: . Drink plenty of water. . Do not consume any caffeinated/decaffeinated beverages or chocolate 12 hours prior to your test. . Do not take any antihistamines 12 hours  prior to your test.   On the Day of the Test: . Drink plenty of water. Do not drink any water within one hour of the test. . Do not eat any food 4 hours prior to the test. . You may take your regular medications prior to the test. . IF NOT ON A BETA BLOCKER - Take 50 mg of lopressor (metoprolol) one hour before the test. . HOLD Furosemide morning of the test.  After the Test: . Drink plenty of water. . After receiving IV contrast, you may experience a mild flushed feeling. This is normal. . On occasion, you may experience a mild rash up to 24 hours after the test. This is not dangerous. If this occurs, you can take Benadryl 25 mg and increase your fluid intake. . If you experience trouble breathing, this can be serious. If it is severe call 911 IMMEDIATELY. If it is mild, please call our office. . If you take any of these medications: Glipizide/Metformin, Avandament, Glucavance, please do not take 48 hours after completing test.

## 2018-02-20 ENCOUNTER — Encounter: Payer: Self-pay | Admitting: Cardiovascular Disease

## 2018-02-23 ENCOUNTER — Ambulatory Visit (HOSPITAL_COMMUNITY): Payer: Medicare Other | Attending: Cardiology

## 2018-02-23 ENCOUNTER — Other Ambulatory Visit: Payer: Self-pay

## 2018-02-23 DIAGNOSIS — R079 Chest pain, unspecified: Secondary | ICD-10-CM | POA: Diagnosis not present

## 2018-02-23 DIAGNOSIS — I351 Nonrheumatic aortic (valve) insufficiency: Secondary | ICD-10-CM | POA: Diagnosis not present

## 2018-02-23 DIAGNOSIS — I10 Essential (primary) hypertension: Secondary | ICD-10-CM | POA: Insufficient documentation

## 2018-02-25 ENCOUNTER — Telehealth: Payer: Self-pay | Admitting: Cardiovascular Disease

## 2018-02-25 NOTE — Telephone Encounter (Signed)
New Message ° ° °Patient is calling to obtain echocardiogram results. Please call.  °

## 2018-02-25 NOTE — Telephone Encounter (Signed)
Patient called w/echo results 

## 2018-03-03 ENCOUNTER — Ambulatory Visit (INDEPENDENT_AMBULATORY_CARE_PROVIDER_SITE_OTHER): Payer: Medicare Other | Admitting: Internal Medicine

## 2018-03-03 ENCOUNTER — Encounter: Payer: Self-pay | Admitting: Internal Medicine

## 2018-03-03 VITALS — BP 130/80 | HR 61 | Temp 97.4°F | Ht 67.0 in | Wt 140.0 lb

## 2018-03-03 DIAGNOSIS — R05 Cough: Secondary | ICD-10-CM | POA: Diagnosis not present

## 2018-03-03 DIAGNOSIS — R058 Other specified cough: Secondary | ICD-10-CM

## 2018-03-03 LAB — POCT EXHALED NITRIC OXIDE: FeNO level (ppb): 16

## 2018-03-03 MED ORDER — TRAMADOL HCL 50 MG PO TABS: 50.0000 mg | ORAL_TABLET | ORAL | 0 refills | Status: AC | PRN

## 2018-03-03 MED ORDER — PREDNISONE 10 MG PO TABS: ORAL_TABLET | ORAL | 0 refills | Status: DC

## 2018-03-03 MED ORDER — FAMOTIDINE 20 MG PO TABS
ORAL_TABLET | ORAL | 11 refills | Status: DC
Start: 2018-03-03 — End: 2018-03-31

## 2018-03-03 MED ORDER — PANTOPRAZOLE SODIUM 40 MG PO TBEC: 40.0000 mg | DELAYED_RELEASE_TABLET | Freq: Every day | ORAL | 2 refills | Status: DC

## 2018-03-03 NOTE — Patient Instructions (Addendum)
Prednisone 10 mg take  4 each am x 2 days,   2 each am x 2 days,  1 each am x 2 days and stop   Take delsym two tsp every 12 hours and supplement if needed with  tramadol 50 mg up to 1-2 every 4 hours to suppress the urge to cough. Swallowing water and/or using ice chips/non mint and menthol containing candies (such as lifesavers or sugarless jolly ranchers) are also effective.  You should rest your voice and avoid activities that you know make you cough.  Once you have eliminated the cough for 3 straight days try reducing the tramadol first,  then the delsym as tolerated.    Pantoprazole (protonix) 40 mg   Take  30-60 min before first meal of the day and Pepcid (famotidine)  20 mg one after supper or at bedtime  until return to office - this is the best way to tell whether stomach acid is contributing to your problem.     For drainage / throat tickle try take CHLORPHENIRAMINE  4 mg - take one every 4 hours as needed - available over the counter- may cause drowsiness so start with just a bedtime dose or two and see how you tolerate it before trying in daytime    Stop the fish oil and co q 10 unless you can get the powder form and then when cough completely gone without the need for cough medications ok to try to resume but take  with bfrast or lunch but never at bedtime   Stop inhaler   Please schedule a follow up office visit in 4 weeks, sooner if needed  with all medications /inhalers/ solutions in hand so we can verify exactly what you are taking. This includes all medications from all doctors and over the counters

## 2018-03-03 NOTE — Progress Notes (Signed)
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  From [place] endemic area for histoplasmosis -she is a Theme park manager   Around March 1751 cyclical cough > resolved completely     12/15/2017 NP eval  Acute OV : Cough  Patient presents for an acute office visit.  Patient complains of 3 days of sore throat , dry cough , tender glands  And tightness . Took albuterol which helped some . She took mucinex D but did not help and made her feel worse, seems to dry her out too much . Denies fever, discolored mucus , chest pain , orthopnea or edema.  Says she had been doing very well until this past few days.  Has a big church dinner this week does not want to be sick.  rec Push Fluids and rest .  Zyrtec 10mg  daily As needed  Drainage  Mucinex Twice daily  As needed  Cough/congestion  Prednisone taper to have on hold if symptoms worse with wheezing  Follow up with Dr. Pennie Ellis in 4-6 months and As needed   Please contact office for sooner follow up if symptoms do not improve or worsen or seek emergency care    Took zpak, never took prednisone or zyrtec > sputum turned dark > took extra zmax x 2 days and mucus turned somewhat lighter  Then 2 weeks later cough turned  dry but  persisted day and noct then much worse for sev days > [place] ER 02/07/18 with a nl CTa and dx of bronchitis > doxy x 10 days/ rx prednisone x 5 days > much better "for the first time"    02/16/2018  Post er  f/u ov/Sandra Ellis re: cough since second week in Apirl 2019  Chief Complaint  Patient presents with  . Hospitalization Follow-up    Went to ED in [place] on 02/07/18 with cough- dxed with bronchitis and put on doxy. She states the cough is much improved.   Dyspnea:   Not limited by breathing from desired activities  But not  aerobic Cough: still some dry coughing with voice use only, no other cough/ using lots of cough drops  Sleep: fine now  rec If not better to your satisfaction the next step in the work up is a sinus CT  - call to schedule @ [phone]  this and return with all active medications. GERD   Diet    03/03/2018 acute extended ov/Sandra Ellis re: cough since around 12/01/17 Chief Complaint  Patient presents with  . Acute Visit    Increased SOB and cough x 2 days. Her cough has been prod this morning but unsure of sputum color. She has been using her albuterol inhaler over the past few days.   cough never completely resolved except the noct resolved on delsym and an allergy pill otc clariton  Sporadic during the day not taking delsym Then one hour after hs awakened with coughing fits/ occurs also before bfast (takes fish oil and co q 10 at hs)  Cough  Severe spells/  dry hacking with burning cp radiates to back   Only with coughing  Not much relief with saba   Not   obvious day to day or daytime variability or assoc excess/ purulent sputum or mucus plugs or hemoptysis or  chest tightness, subjective wheeze or overt sinus or hb symptoms.    Also denies any obvious fluctuation of symptoms with weather or environmental changes or other aggravating or alleviating factors except as outlined above   No unusual exposure hx or h/o childhood pna/ asthma or knowledge of premature birth.  Current Allergies, Complete Past Medical History, Past Surgical History, Family History, and Social History were reviewed in [employer] record.  ROS  The following are not active complaints unless bolded Hoarseness, sore throat, dysphagia, dental problems, itching, sneezing,  nasal congestion or discharge of excess mucus or purulent secretions, ear ache,   fever, chills, sweats, unintended wt loss or wt gain, classically pleuritic or exertional cp,  orthopnea pnd or arm/hand swelling  or leg swelling,  presyncope, palpitations, abdominal pain, anorexia, nausea, vomiting, diarrhea  or change in bowel habits or change in bladder habits, change in stools or change in urine, dysuria, hematuria,  rash, arthralgias, visual complaints, headache, numbness, weakness or ataxia or problems with walking or coordination,  change in mood or  memory.        Current Meds  Medication Sig  . albuterol (VENTOLIN HFA) 108 (90 Base) MCG/ACT inhaler Inhale 2 puffs into the lungs every 6 (six) hours as needed for wheezing or shortness of breath.  Marland Kitchen atenolol (TENORMIN) 25 MG tablet Take 12.5 mg by mouth at bedtime.   . cholecalciferol (VITAMIN D) 400 units TABS tablet Take 400 Units by mouth.  Marland Kitchen CINNAMON PO Take 1 tablet by mouth 1 day or 1 dose.  . Coenzyme Q10 (CO Q-10) 300 MG CAPS Take 300 mg by mouth daily.  . hydrochlorothiazide (HYDRODIURIL) 25 MG tablet Take 0.5 tablets (12.5 mg total) by mouth as needed (Swelling).  Marland Kitchen levothyroxine (SYNTHROID, LEVOTHROID) 75 MCG tablet Take 75 mcg by mouth daily before breakfast.   . Magnesium 250 MG TABS Take 250 mg by mouth daily at 2 PM.  . Omega-3 Fatty Acids (FISH OIL) 1200 MG CAPS Take 1,200 mg by mouth 2 (two) times daily.  . potassium gluconate 595 (99 K) MG TABS tablet Take 595 mg by mouth daily at 2 PM.  . Probiotic Product (PROBIOTIC PO) Take 2 tablets by mouth daily.  . rosuvastatin (CRESTOR) 5 MG tablet Take 5 mg by mouth every other day.   . tretinoin (RETIN-A) 0.1 % cream Apply 1 application topically at bedtime. APPLIED TO FACE                     PEx  amb wf nad   03/03/2018         140   02/16/18 141 lb (64 kg)  12/15/17 140 lb (63.5 kg)  09/30/17 138 lb 9.6 oz (62.9 kg)     Vital signs reviewed - Note on arrival 02 sats  95% on RA      HEENT: nl dentition, turbinates bilaterally, and oropharynx. Nl external ear canals without cough reflex   NECK :  without JVD/Nodes/TM/ nl carotid upstrokes bilaterally   LUNGS: no acc muscle use,   Nl contour chest which is clear to A and P bilaterally without cough on insp or exp maneuvers   CV:  RRR  no s3 or murmur or increase in P2, and no edema   ABD:  soft and nontender with nl inspiratory excursion in the supine position. No bruits or organomegaly appreciated, bowel sounds nl  MS:  Nl gait/ ext warm without deformities, calf tenderness, cyanosis or clubbing  No obvious joint restrictions   SKIN: warm and dry without lesions    NEURO:  alert, approp, nl sensorium with  no motor or cerebellar deficits apparent.           I personally reviewed images and agree with radiology impression as follows:   Chest CTa  02/07/18 No active cardiopulmonary disease. Prior granulomatous disease.      TESTS reviewed 02/16/2018  CT chest HRCT  01/18/16  : no ILD, very mild cylindrical  bronchiectasis , old granulomatous dz.  02/21/18  IgE 196 , RAST neg. WBC was elevated (on steroids ) , eos ok  PFT /14//2017 .FEV1 100% , ratio 80 , FVC 94, DLCO 705.   Assessment & Plan:

## 2018-03-06 ENCOUNTER — Encounter: Payer: Self-pay | Admitting: Internal Medicine

## 2018-03-06 NOTE — Assessment & Plan Note (Addendum)
Onset around 12/12/17 - rx pred/ doxy 02/07/18 and improved but did not resolve - FENO 7/0/3500  =   16  - cyclical cough rx 05/05/8181   No evidence of asthma here so ok to d/c saba which may just aggravate the cough  Of the three most common causes of  Sub-acute / recurrent or chronic cough, only one (GERD)  can actually contribute to/ trigger  the other two (asthma and post nasal drip syndrome)  and perpetuate the cylce of cough.  While not intuitively obvious, many patients with chronic low grade reflux do not cough until there is a primary insult that disturbs the protective epithelial barrier and exposes sensitive nerve endings.   This is typically viral but can due to PNDS and  either may apply here.     The point is that once this occurs, it is difficult to eliminate the cycle  using anything but a maximally effective acid suppression regimen at least in the short run, accompanied by an appropriate diet to address non acid GERD and control / eliminate pnds issue with 1st gen H1 blockers per guidelines  and the cough itself for at least 3 days with tramadol  If not better return w/in 4 weeks with all meds in hand using a trust but verify approach to confirm accurate Medication  Reconciliation The principal here is that until we are certain that the  patients are doing what we've asked, it makes no sense to ask them to do more.    I had an extended discussion with the patient reviewing all relevant studies completed to date and  lasting 15 to 20 minutes of a 25 minute acute office visit    Each maintenance medication was reviewed in detail including most importantly the difference between maintenance and prns and under what circumstances the prns are to be triggered using an action plan format that is not reflected in the computer generated alphabetically organized AVS.    Please see AVS for specific instructions unique to this visit that I personally wrote and verbalized to the the pt in detail  and then reviewed with pt  by my nurse highlighting any  changes in therapy recommended at today's visit to their plan of care.

## 2018-03-09 ENCOUNTER — Telehealth: Payer: Self-pay | Admitting: Internal Medicine

## 2018-03-09 DIAGNOSIS — J209 Acute bronchitis, unspecified: Secondary | ICD-10-CM

## 2018-03-09 NOTE — Telephone Encounter (Signed)
Sorry to hear that > needs sinus CT asap limited and I will call with results

## 2018-03-09 NOTE — Telephone Encounter (Signed)
States has drainage in the back of her throat, feels like there is a lump there.

## 2018-03-09 NOTE — Telephone Encounter (Signed)
Ct ordered.  Pt aware of recs.  Nothing further needed.

## 2018-03-09 NOTE — Telephone Encounter (Signed)
Spoke with pt, states that she is not feeling any better since 7/2 OV.  C/o sinus congestion, pnd, sob, b/l calf pain, fatigue, dizziness.  Pt states that she has followed MW's recs from 7/2 OV and is having no improvement.   Pt states that a ct sinus was mentioned at last OV, pt would like to proceed with this as well as additional recs for s/s.    Pharmacy: [facility] on [facility]  MW please advise on additional recs.  Thanks!   7/2 AVS: Instructions   Prednisone 10 mg take  4 each am x 2 days,   2 each am x 2 days,  1 each am x 2 days and stop    Take delsym two tsp every 12 hours and supplement if needed with  tramadol 50 mg up to 1-2 every 4 hours to suppress the urge to cough. Swallowing water and/or using ice chips/non mint and menthol containing candies (such as lifesavers or sugarless jolly ranchers) are also effective.  You should rest your voice and avoid activities that you know make you cough.   Once you have eliminated the cough for 3 straight days try reducing the tramadol first,  then the delsym as tolerated.     Pantoprazole (protonix) 40 mg   Take  30-60 min before first meal of the day and Pepcid (famotidine)  20 mg one after supper or at bedtime  until return to office - this is the best way to tell whether stomach acid is contributing to your problem.       For drainage / throat tickle try take CHLORPHENIRAMINE  4 mg - take one every 4 hours as needed - available over the counter- may cause drowsiness so start with just a bedtime dose or two and see how you tolerate it before trying in daytime     Stop the fish oil and co q 10 unless you can get the powder form and then when cough completely gone without the need for cough medications ok to try to resume but take  with bfrast or lunch but never at bedtime     Stop inhaler    Please schedule a follow up office visit in 4 weeks, sooner if needed  with all medications /inhalers/ solutions in hand so we can verify exactly  what you are taking. This includes all medications from all doctors and over the counters

## 2018-03-10 ENCOUNTER — Ambulatory Visit (INDEPENDENT_AMBULATORY_CARE_PROVIDER_SITE_OTHER)
Admission: RE | Admit: 2018-03-10 | Discharge: 2018-03-10 | Disposition: A | Discharge status: Home / Self Care | Payer: Medicare Other | Source: Ambulatory Visit | Attending: Internal Medicine | Admitting: Internal Medicine

## 2018-03-10 ENCOUNTER — Inpatient Hospital Stay: Admission: RE | Admit: 2018-03-10 | Payer: Medicare Other | Source: Ambulatory Visit

## 2018-03-10 DIAGNOSIS — J209 Acute bronchitis, unspecified: Secondary | ICD-10-CM | POA: Diagnosis not present

## 2018-03-10 DIAGNOSIS — J42 Unspecified chronic bronchitis: Secondary | ICD-10-CM | POA: Diagnosis not present

## 2018-03-11 ENCOUNTER — Telehealth: Payer: Self-pay | Admitting: Internal Medicine

## 2018-03-11 DIAGNOSIS — J029 Acute pharyngitis, unspecified: Secondary | ICD-10-CM

## 2018-03-11 NOTE — Telephone Encounter (Signed)
We are awaiting a response from Dr. Melvyn Novas. Will call pt when he responds about ENT referral.

## 2018-03-11 NOTE — Telephone Encounter (Signed)
Called and spoke with patient, advised her that the results are normal from [facility] result note. Patient has asked that we send her to an ENT for her sore throat. She states that this happens all the time. Patient would like to be referred.    MW please advise, thank you.

## 2018-03-11 NOTE — Telephone Encounter (Signed)
Patient returned phone call; pt contact # [phone]

## 2018-03-11 NOTE — Telephone Encounter (Signed)
Fine with me : [facility]

## 2018-03-11 NOTE — Telephone Encounter (Signed)
Pt is aware of below message and voiced her understanding.  Referral has been placed. Nothing further is needed.

## 2018-03-18 ENCOUNTER — Ambulatory Visit (INDEPENDENT_AMBULATORY_CARE_PROVIDER_SITE_OTHER): Payer: Medicare Other | Admitting: Primary Care

## 2018-03-18 ENCOUNTER — Encounter: Payer: Self-pay | Admitting: Primary Care

## 2018-03-18 ENCOUNTER — Other Ambulatory Visit (INDEPENDENT_AMBULATORY_CARE_PROVIDER_SITE_OTHER): Payer: Medicare Other

## 2018-03-18 ENCOUNTER — Ambulatory Visit (INDEPENDENT_AMBULATORY_CARE_PROVIDER_SITE_OTHER)
Admission: RE | Admit: 2018-03-18 | Discharge: 2018-03-18 | Disposition: A | Discharge status: Home / Self Care | Payer: Medicare Other | Source: Ambulatory Visit | Attending: Primary Care | Admitting: Primary Care

## 2018-03-18 VITALS — BP 108/62 | HR 58 | Ht 67.0 in | Wt 138.0 lb

## 2018-03-18 DIAGNOSIS — R0602 Shortness of breath: Secondary | ICD-10-CM

## 2018-03-18 DIAGNOSIS — R05 Cough: Secondary | ICD-10-CM

## 2018-03-18 DIAGNOSIS — R5383 Other fatigue: Secondary | ICD-10-CM

## 2018-03-18 DIAGNOSIS — R058 Other specified cough: Secondary | ICD-10-CM

## 2018-03-18 LAB — TSH: TSH: 0.66 u[IU]/mL (ref 0.35–4.50)

## 2018-03-18 LAB — CBC WITH DIFFERENTIAL/PLATELET
Basophils Absolute: 0.1 10*3/uL (ref 0.0–0.1)
Basophils Relative: 0.4 % (ref 0.0–3.0)
Eosinophils Absolute: 0 10*3/uL (ref 0.0–0.7)
Eosinophils Relative: 0.2 % (ref 0.0–5.0)
HCT: 43.2 % (ref 36.0–46.0)
Hemoglobin: 14.4 g/dL (ref 12.0–15.0)
Lymphocytes Relative: 13.8 % (ref 12.0–46.0)
Lymphs Abs: 1.7 10*3/uL (ref 0.7–4.0)
MCHC: 33.3 g/dL (ref 30.0–36.0)
MCV: 92.9 fl (ref 78.0–100.0)
Monocytes Absolute: 0.4 10*3/uL (ref 0.1–1.0)
Monocytes Relative: 3 % (ref 3.0–12.0)
Neutro Abs: 10.4 10*3/uL — ABNORMAL HIGH (ref 1.4–7.7)
Neutrophils Relative %: 82.6 % — ABNORMAL HIGH (ref 43.0–77.0)
Platelets: 336 10*3/uL (ref 150.0–400.0)
RBC: 4.65 Mil/uL (ref 3.87–5.11)
RDW: 14.1 % (ref 11.5–15.5)
WBC: 12.6 10*3/uL — ABNORMAL HIGH (ref 4.0–10.5)

## 2018-03-18 MED ORDER — BUDESONIDE-FORMOTEROL FUMARATE 80-4.5 MCG/ACT IN AERO: 2.0000 | INHALATION_SPRAY | Freq: Two times a day (BID) | RESPIRATORY_TRACT | 12 refills | Status: DC

## 2018-03-18 NOTE — Assessment & Plan Note (Signed)
-   Complains of SOB, no cough today - Patient stopped tramadol and GERD therapy d/t lack of improvement  - Checking allergy panel, IgE elevated in the past - Consider daily antihistamine ??

## 2018-03-18 NOTE — Assessment & Plan Note (Signed)
-   Checking CBC for anemia and TSH

## 2018-03-18 NOTE — Assessment & Plan Note (Addendum)
-   Continue work up, unclear etiology. Echo and most recent PFTs were normal.  - Order CXR, cbc with diff, allergy panel and Ddimer - Trial Symbicort

## 2018-03-18 NOTE — Patient Instructions (Signed)
Checking CXR, TSH, CBC, allergy panel and ddimer  Ambulatory O2 saturation was normal Starting you on a daily maintenance inhaler called Symbicort  FU in 3-4 weeks with [facility] or Dr. Melvyn Novas

## 2018-03-18 NOTE — Progress Notes (Signed)
@Patient  ID: Sandra Ellis, female    DOB: February 03, 1946, 72 y.o.   MRN: [id number]  Chief Complaint  Patient presents with  . Acute Visit    SOB,cough yellow mucus     Referring provider: Shirline Frees, MD  HPI: 72 year old female, former smoker (quit 1975). Hx HTN, bronchiectasis, pulm nodule and upper airway cough syndrome. Patient of Dr. Melvyn Novas, last seen on 03/03/18 for cough and sob. She was started on delsym, tramadol and protonix/pepcid. Reported no improvement. Called office with additional complaints of sinus congestion, sob, fatigue and dizziness. CT sinus was ordered, normal results.   03/18/2018 Presents today with complaints of shortness of breath and fatigue, stating "I'm having trouble with my breathing". No other associated symptoms, she does not have a cough today. States that her breathing can cause a cough. She has not felt well since April 2019 when she was diagnosed with bronchitis and treated with zpack and prednisone. States that prednisone is the only thing that helps her breathing but it's starting to not have an effect. She tried GERD management for two weeks and then stopped because it did not help. She is not taking tramadol because she did not like the way it made her feel. Received an additional short prednisone course and tessalon perles from a neighbor/ friend over the weekend (03/14/18). She remains active, swims daily. Hx PE in her 54s. Denies sharp chest pain, leg swelling.    Testing PFT (05/2016) - No obstruction or restriction   Amb O2 sat 95% RA   FENO - 16  IgE (04/2016) - 166  HRCT (01/2016) - No ILD, mild bronchiectasis   CT maxillofacial (03/10/2018)-  No inflammatory change or focal finding  ECHO- EF 60-65%, normal LV & RV size and function. Pulmonary artery normal size and systolic pressure was within normal range.Aortic valve: Trileaflet; mildly thickened, mildly calcified leaflets. There was mild regurgitation.Tricuspid valve: There was  trivial regurgitation  Allergies  Allergen Reactions  . Levaquin [Levofloxacin In D5w] Other (See Comments)    NUMBNESS/REDNESS  . Ciprofloxacin Rash and Other (See Comments)    NUMBNESS/REDNESS/RASH    Immunization History  Administered Date(s) Administered  . Influenza, High Dose Seasonal PF 06/16/2017  . Influenza,inj,Quad PF,6+ Mos 06/24/2015  . Influenza,inj,quad, With Preservative 06/02/2017  . Influenza-Unspecified 06/02/2014  . Pneumococcal Conjugate-13 10/12/2015    Past Medical History:  Diagnosis Date  . Elevated cholesterol   . H/O blood clots 1990    Tobacco History: Social History   Tobacco Use  Smoking Status Former Smoker  . Packs/day: 0.50  . Years: 8.00  . Pack years: 4.00  . Types: Cigarettes  . Last attempt to quit: 09/02/1973  . Years since quitting: 44.5  Smokeless Tobacco Never Used   Counseling given: Not Answered   Outpatient Medications Prior to Visit  Medication Sig Dispense Refill  . atenolol (TENORMIN) 25 MG tablet Take 12.5 mg by mouth at bedtime.     . benzonatate (TESSALON) 200 MG capsule Take 200 mg by mouth 3 (three) times daily as needed for cough.    Marland Kitchen CINNAMON PO Take 1 tablet by mouth 1 day or 1 dose.    . Coenzyme Q10 (CO Q-10) 300 MG CAPS Take 300 mg by mouth daily.    Marland Kitchen levothyroxine (SYNTHROID, LEVOTHROID) 75 MCG tablet Take 75 mcg by mouth daily before breakfast.     . Magnesium 250 MG TABS Take 250 mg by mouth daily at 2 PM.    .  Omega-3 Fatty Acids (FISH OIL) 1200 MG CAPS Take 1,200 mg by mouth 2 (two) times daily.    . potassium gluconate 595 (99 K) MG TABS tablet Take 595 mg by mouth daily at 2 PM.    . predniSONE (DELTASONE) 10 MG tablet Take  4 each am x 2 days,   2 each am x 2 days,  1 each am x 2 days and stop 14 tablet 0  . Probiotic Product (PROBIOTIC PO) Take 2 tablets by mouth daily.    . rosuvastatin (CRESTOR) 5 MG tablet Take 5 mg by mouth every other day.     . tretinoin (RETIN-A) 0.1 % cream Apply 1  application topically at bedtime. APPLIED TO FACE    . famotidine (PEPCID) 20 MG tablet One at bedtime (Patient not taking: Reported on 03/18/2018) 30 tablet 11  . cholecalciferol (VITAMIN D) 400 units TABS tablet Take 400 Units by mouth.    . hydrochlorothiazide (HYDRODIURIL) 25 MG tablet Take 0.5 tablets (12.5 mg total) by mouth as needed (Swelling).    . pantoprazole (PROTONIX) 40 MG tablet Take 1 tablet (40 mg total) by mouth daily. Take 30-60 min before first meal of the day 30 tablet 2   No facility-administered medications prior to visit.       Review of Systems  Review of Systems  Constitutional: Positive for fatigue.  HENT: Negative.   Respiratory: Positive for shortness of breath. Negative for cough and wheezing.   Cardiovascular: Negative.     Physical Exam  BP 108/62 (BP Location: Left Arm, Cuff Size: Normal)   Pulse (!) 58   Ht 5\' 7"  (1.702 m)   Wt 138 lb (62.6 kg)   SpO2 99%   BMI 21.61 kg/m  Physical Exam  Constitutional: She is oriented to person, place, and time. She appears well-developed and well-nourished.  Thin frame, appears fatigued   HENT:  Head: Normocephalic and atraumatic.  Eyes: Pupils are equal, round, and reactive to light. EOM are normal.  Neck: Normal range of motion. Neck supple.  Cardiovascular: Normal rate and regular rhythm.  No murmur heard. Pulmonary/Chest: Effort normal and breath sounds normal. No respiratory distress. She has no wheezes. She has no rales.  Abdominal: Soft. Bowel sounds are normal. There is no tenderness.  Neurological: She is alert and oriented to person, place, and time.  Skin: Skin is warm and dry. No rash noted. No erythema.  Psychiatric: She has a normal mood and affect. Her behavior is normal. Judgment normal.     Lab Results:  CBC    Component Value Date/Time   WBC 12.6 (H) 03/18/2018 1155   RBC 4.65 03/18/2018 1155   HGB 14.4 03/18/2018 1155   HCT 43.2 03/18/2018 1155   PLT 336.0 03/18/2018 1155    MCV 92.9 03/18/2018 1155   MCH 31.0 01/23/2017 1323   MCHC 33.3 03/18/2018 1155   RDW 14.1 03/18/2018 1155   LYMPHSABS 1.7 03/18/2018 1155   MONOABS 0.4 03/18/2018 1155   EOSABS 0.0 03/18/2018 1155   BASOSABS 0.1 03/18/2018 1155    BMET    Component Value Date/Time   NA 128 (L) 01/23/2017 1323   K 3.8 01/23/2017 1323   CL 94 (L) 01/23/2017 1323   CO2 26 01/23/2017 1323   GLUCOSE 105 (H) 01/23/2017 1323   BUN 10 01/23/2017 1323   CREATININE 0.77 01/23/2017 1323   CALCIUM 9.5 01/23/2017 1323   GFRNONAA >60 01/23/2017 1323   GFRAA >60 01/23/2017 1323  BNP No results found for: BNP  ProBNP No results found for: PROBNP  Imaging: [place] [facility]  Result Date: 03/10/2018 CLINICAL DATA:  Recurrent bronchitis.  Sinus drainage.  Cough. EXAM: CT PARANASAL SINUS LIMITED WITHOUT CONTRAST TECHNIQUE: Non-contiguous multidetector CT images of the paranasal sinuses were obtained in a single plane without contrast. COMPARISON:  None. FINDINGS: Visualized frontal, ethmoid, maxillary and sphenoid sinuses are clear. No mucosal thickening, fluid, polyp, cyst or mass identified. IMPRESSION: Negative.  No inflammatory change or other focal finding. Electronically Signed   By: Nelson Chimes M.D.   On: 03/10/2018 15:43     Assessment & Plan:   Dyspnea - Continue work up, unclear etiology. Echo and most recent PFTs were normal.  - Order CXR, cbc with diff, allergy panel and Ddimer - Trial Symbicort   Upper airway cough syndrome vs cough variant asthma  - Complains of SOB, no cough today - Patient stopped tramadol and GERD therapy d/t lack of improvement  - Checking allergy panel, IgE elevated in the past - Consider daily antihistamine ??    Fatigue - Checking CBC for anemia and TSH      Martyn Ehrich, NP 03/18/2018

## 2018-03-19 LAB — RESPIRATORY ALLERGY PROFILE REGION II ~~LOC~~
Allergen, A. alternata, m6: <0.1 kU/L
Allergen, Cedar tree, t12: <0.1 kU/L
Allergen, Comm Silver Birch, t9: <0.1 kU/L
Allergen, Cottonwood, t14: <0.1 kU/L
Allergen, D pternoyssinus,d7: <0.1 kU/L
Allergen, Mouse Urine Protein, e78: <0.1 kU/L
Allergen, Mulberry, t76: <0.1 kU/L
Allergen, Oak,t7: <0.1 kU/L
Allergen, P. notatum, m1: <0.1 kU/L
Aspergillus fumigatus, m3: <0.1 kU/L
Bermuda Grass: <0.1 kU/L
Box Elder IgE: <0.1 kU/L
CLADOSPORIUM HERBARUM (M2) IGE: <0.1 kU/L
COMMON RAGWEED (SHORT) (W1) IGE: <0.1 kU/L
Cat Dander: <0.1 kU/L
Class: 0
Class: 0
Class: 0
Class: 0
Class: 0
Class: 0
Class: 0
Class: 0
Class: 0
Class: 0
Class: 0
Class: 0
Class: 0
Class: 0
Class: 0
Class: 0
Class: 0
Class: 0
Class: 0
Class: 0
Class: 0
Class: 0
Class: 0
Class: 0
Cockroach: <0.1 kU/L
D. farinae: <0.1 kU/L
Dog Dander: <0.1 kU/L
Elm IgE: <0.1 kU/L
IgE (Immunoglobulin E), Serum: 202 kU/L — ABNORMAL HIGH (ref <=114)
Johnson Grass: <0.1 kU/L
Pecan/Hickory Tree IgE: <0.1 kU/L
Rough Pigweed  IgE: <0.1 kU/L
Sheep Sorrel IgE: <0.1 kU/L
Timothy Grass: <0.1 kU/L

## 2018-03-19 LAB — D-DIMER, QUANTITATIVE: D-Dimer, Quant: 0.42 mcg/mL FEU (ref <0.50)

## 2018-03-19 LAB — INTERPRETATION:

## 2018-03-19 NOTE — Progress Notes (Signed)
Chart and office note reviewed in detail  > agree with a/p as outlined    

## 2018-03-20 ENCOUNTER — Ambulatory Visit (INDEPENDENT_AMBULATORY_CARE_PROVIDER_SITE_OTHER): Payer: Medicare Other | Admitting: Primary Care

## 2018-03-20 ENCOUNTER — Encounter: Payer: Self-pay | Admitting: Primary Care

## 2018-03-20 VITALS — BP 106/68 | HR 52 | Temp 97.0°F | Ht 67.0 in | Wt 138.0 lb

## 2018-03-20 DIAGNOSIS — R0602 Shortness of breath: Secondary | ICD-10-CM

## 2018-03-20 DIAGNOSIS — J209 Acute bronchitis, unspecified: Secondary | ICD-10-CM

## 2018-03-20 DIAGNOSIS — R05 Cough: Secondary | ICD-10-CM | POA: Diagnosis not present

## 2018-03-20 DIAGNOSIS — R059 Cough, unspecified: Secondary | ICD-10-CM

## 2018-03-20 MED ORDER — ALBUTEROL SULFATE HFA 108 (90 BASE) MCG/ACT IN AERS: 2.0000 | INHALATION_SPRAY | Freq: Four times a day (QID) | RESPIRATORY_TRACT | 6 refills | Status: DC | PRN

## 2018-03-20 MED ORDER — MONTELUKAST SODIUM 10 MG PO TABS: 10.0000 mg | ORAL_TABLET | Freq: Every day | ORAL | 11 refills | Status: DC

## 2018-03-20 MED ORDER — BENZONATATE 200 MG PO CAPS: 200.0000 mg | ORAL_CAPSULE | Freq: Three times a day (TID) | ORAL | 1 refills | Status: DC | PRN

## 2018-03-20 NOTE — Patient Instructions (Addendum)
Add Singulair - for allergies  Continue symbicort as prescribed  Rescue inahler - for shortness of breath Take mucinex twice daily- for congestion Delsym as needed- for cough  Pulmonary function tests normal today    Return as needed, follow up with PCP

## 2018-03-20 NOTE — Assessment & Plan Note (Signed)
-   Possible underlying asthmatic component - Started on Symbicort trial

## 2018-03-20 NOTE — Assessment & Plan Note (Addendum)
-   Recurrent, no signs of bacterial infection  - Cont to encourage supportive therapy, reassurance provided - Rec mucinex, delsym, short acting bronchodilator

## 2018-03-20 NOTE — Progress Notes (Signed)
@Patient  ID: Sandra Ellis, female    DOB: [birth year], 72 y.o.   MRN: [id number]  Chief Complaint  Patient presents with  . Acute Visit    States she just cant breath, using symbicort everyday as directed    Referring provider: Shirline Frees, MD  HPI: 72 year old female, Former smoker.  Patient of Dr. Melvyn Novas, last seen 2 days ago in office for shortness of breath and started on Symbicort trial for possible underlying asthmatic component.  Prior to that she had been worked up for upper airway cough syndrome, medication trial was not effective.  She had been treated with Z-Pak and prednisone with no real improvement. Prednisone was the only thing that seems to help her breathing. Protonix/pepcid was not effective and tramadol made the patient tired.  Test: Maxillofacial CT normal.   HRCT in 2017 showing no ILD.   PFTs showing no obstruction or restriction.  Echo showing EF 60-65%.  Ambulatory O2 sat 95% on room air. FENO 16  D-dimer neg Allergy panel- neg IgE- 202 (166)  03/20/2018 Presents today for acute visit. Patient complains of sob and cough. States that she woke up this morning short of breath with a loose congested cough, yellow sputum. Denies sleep apnea symptoms. Husbands feels that she has asthma and that the heat has exacerbated her symptoms.   Spirometry today- FVC 3.06 (91%), FEV1 2.37 (92%), ratio 77 (102%). NO restriction or obstruction.   Allergies  Allergen Reactions  . Levaquin [Levofloxacin In D5w] Other (See Comments)    NUMBNESS/REDNESS  . Ciprofloxacin Rash and Other (See Comments)    NUMBNESS/REDNESS/RASH    Immunization History  Administered Date(s) Administered  . Influenza, High Dose Seasonal PF 06/16/2017  . Influenza,inj,Quad PF,6+ Mos 06/24/2015  . Influenza,inj,quad, With Preservative 06/02/2017  . Influenza-Unspecified 06/02/2014  . Pneumococcal Conjugate-13 10/12/2015    Past Medical History:  Diagnosis Date  . Elevated cholesterol     . H/O blood clots 1990    Tobacco History: Social History   Tobacco Use  Smoking Status Former Smoker  . Packs/day: 0.50  . Years: 8.00  . Pack years: 4.00  . Types: Cigarettes  . Last attempt to quit: 09/02/1973  . Years since quitting: 44.5  Smokeless Tobacco Never Used   Counseling given: Not Answered   Outpatient Medications Prior to Visit  Medication Sig Dispense Refill  . atenolol (TENORMIN) 25 MG tablet Take 12.5 mg by mouth at bedtime.     . budesonide-formoterol (SYMBICORT) 80-4.5 MCG/ACT inhaler Inhale 2 puffs into the lungs 2 (two) times daily. 1 Inhaler 12  . CINNAMON PO Take 1 tablet by mouth 1 day or 1 dose.    . Coenzyme Q10 (CO Q-10) 300 MG CAPS Take 300 mg by mouth daily.    . famotidine (PEPCID) 20 MG tablet One at bedtime 30 tablet 11  . levothyroxine (SYNTHROID, LEVOTHROID) 75 MCG tablet Take 75 mcg by mouth daily before breakfast.     . Magnesium 250 MG TABS Take 250 mg by mouth daily at 2 PM.    . Omega-3 Fatty Acids (FISH OIL) 1200 MG CAPS Take 1,200 mg by mouth 2 (two) times daily.    . potassium gluconate 595 (99 K) MG TABS tablet Take 595 mg by mouth daily at 2 PM.    . predniSONE (DELTASONE) 10 MG tablet Take  4 each am x 2 days,   2 each am x 2 days,  1 each am x 2 days and stop 14 tablet 0  .  Probiotic Product (PROBIOTIC PO) Take 2 tablets by mouth daily.    . rosuvastatin (CRESTOR) 5 MG tablet Take 5 mg by mouth every other day.     . tretinoin (RETIN-A) 0.1 % cream Apply 1 application topically at bedtime. APPLIED TO FACE    . benzonatate (TESSALON) 200 MG capsule Take 200 mg by mouth 3 (three) times daily as needed for cough.     No facility-administered medications prior to visit.       Review of Systems  Review of Systems  Constitutional: Negative.   HENT: Negative.   Respiratory: Positive for cough and shortness of breath. Negative for wheezing.   Musculoskeletal: Negative.   Skin: Negative.   Psychiatric/Behavioral: Negative.       Physical Exam  BP 106/68   Pulse (!) 52   Temp (!) 97 F (36.1 C) (Oral)   Ht 5\' 7"  (1.702 m)   Wt 138 lb (62.6 kg)   SpO2 99%   BMI 21.61 kg/m  Physical Exam  Constitutional: She is oriented to person, place, and time. She appears well-developed and well-nourished.  Appears fatigued  HENT:  Head: Normocephalic and atraumatic.  Eyes: Pupils are equal, round, and reactive to light. EOM are normal.  Neck: Normal range of motion. Neck supple.  Cardiovascular: Normal rate, regular rhythm and normal heart sounds.  No murmur heard. Pulmonary/Chest: Effort normal and breath sounds normal. No respiratory distress. She has no wheezes.  [place] t/o. No wheeze or rhonchi.   Abdominal: Soft. Bowel sounds are normal. There is no tenderness.  Neurological: She is alert and oriented to person, place, and time.  Skin: Skin is warm and dry. No rash noted. No erythema.  Psychiatric: She has a normal mood and affect. Her behavior is normal. Judgment normal.     Lab Results:  CBC    Component Value Date/Time   WBC 12.6 (H) 03/18/2018 1155   RBC 4.65 03/18/2018 1155   HGB 14.4 03/18/2018 1155   HCT 43.2 03/18/2018 1155   PLT 336.0 03/18/2018 1155   MCV 92.9 03/18/2018 1155   MCH 31.0 01/23/2017 1323   MCHC 33.3 03/18/2018 1155   RDW 14.1 03/18/2018 1155   LYMPHSABS 1.7 03/18/2018 1155   MONOABS 0.4 03/18/2018 1155   EOSABS 0.0 03/18/2018 1155   BASOSABS 0.1 03/18/2018 1155    Imaging: Dg Chest 2 View  Result Date: 03/18/2018 CLINICAL DATA:  Shortness of breath and cough since April 2019, has had 2 rounds of antibiotics and prednisone, history benign essential hypertension, bronchiectasis, former smoker EXAM: CHEST - 2 VIEW COMPARISON:  08/04/2016 FINDINGS: Upper normal heart size. Mediastinal contours and pulmonary vascularity normal. Emphysematous changes with calcified granulomata in RIGHT upper lobe. No pulmonary infiltrate, pleural effusion, or pneumothorax. Bones demineralized.  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 No acute abnormalities. Electronically Signed   By: Lavonia Dana M.D.   On: 03/18/2018 16:40   Ct Maxillofacial Ltd Wo Cm  Result Date: 03/10/2018 CLINICAL DATA:  Recurrent bronchitis.  Sinus drainage.  Cough. EXAM: CT PARANASAL SINUS LIMITED WITHOUT CONTRAST TECHNIQUE: Non-contiguous multidetector CT images of the paranasal sinuses were obtained in a single plane without contrast. COMPARISON:  None. FINDINGS: Visualized frontal, ethmoid, maxillary and sphenoid sinuses are clear. No mucosal thickening, fluid, polyp, cyst or mass identified. IMPRESSION: Negative.  No inflammatory change or other focal finding. Electronically Signed   By: Nelson Chimes M.D.   On: 03/10/2018 15:43     Assessment & Plan:   Acute  bronchitis - Recurrent, no signs of bacterial infection  - Cont to encourage supportive therapy, reassurance provided - Rec mucinex, delsym, short acting bronchodilator   Cough - Possible underlying asthmatic component - Started on Symbicort trial      Martyn Ehrich, NP 03/20/2018

## 2018-03-21 NOTE — Progress Notes (Signed)
Chart and office note reviewed in detail  > agree with a/p as outlined    

## 2018-03-24 ENCOUNTER — Other Ambulatory Visit: Payer: Self-pay | Admitting: *Deleted

## 2018-03-24 ENCOUNTER — Encounter: Payer: Self-pay | Admitting: Primary Care

## 2018-03-24 DIAGNOSIS — R072 Precordial pain: Secondary | ICD-10-CM

## 2018-03-25 ENCOUNTER — Telehealth: Payer: Self-pay | Admitting: Primary Care

## 2018-03-25 ENCOUNTER — Other Ambulatory Visit: Payer: Self-pay

## 2018-03-25 ENCOUNTER — Encounter: Payer: Self-pay | Admitting: Internal Medicine

## 2018-03-25 DIAGNOSIS — R0602 Shortness of breath: Secondary | ICD-10-CM

## 2018-03-25 NOTE — Telephone Encounter (Signed)
Beth, please advise on the following email from pt, thanks!  From: [name]    Sent: 03/24/2018  6:08 PM EDT      To: Martyn Ehrich, NP Subject: Visit Follow-Up Question  I noticed 3 tests on blood work that indicate an infection.  WBC, NEUTROPHILS RELATIVE , neutro abs. I  started symbicort,  allergy, singulair  Friday and have noticedno improvement . Why is blood test not in the normal range.

## 2018-03-25 NOTE — Telephone Encounter (Signed)
Spoke with patient regarding her e-mail, WBC likely elevated d/t recent prednisone course. Recommend repeating today, patient states that she has an apt with cardiology and will have labs drawn.

## 2018-03-27 ENCOUNTER — Telehealth: Payer: Self-pay | Admitting: Primary Care

## 2018-03-27 ENCOUNTER — Encounter: Payer: Self-pay | Admitting: Primary Care

## 2018-03-27 NOTE — Telephone Encounter (Signed)
Sandra Ellis,  I have placed results for CBC Diff on your desk from [place]. Please advise. Thanks.

## 2018-03-27 NOTE — Telephone Encounter (Signed)
Advised pt of results. Pt understood and nothing further is needed.   

## 2018-03-27 NOTE — Telephone Encounter (Signed)
Please let patient know that her CBC lab results were completely normal. Thanks

## 2018-03-31 ENCOUNTER — Encounter: Payer: Self-pay | Admitting: Internal Medicine

## 2018-03-31 ENCOUNTER — Ambulatory Visit (INDEPENDENT_AMBULATORY_CARE_PROVIDER_SITE_OTHER): Payer: Medicare Other | Admitting: Internal Medicine

## 2018-03-31 VITALS — BP 140/82 | HR 65 | Ht 67.0 in | Wt 139.8 lb

## 2018-03-31 DIAGNOSIS — R05 Cough: Secondary | ICD-10-CM

## 2018-03-31 DIAGNOSIS — R058 Other specified cough: Secondary | ICD-10-CM

## 2018-03-31 NOTE — Progress Notes (Signed)
@Patient  ID: Sandra Ellis, female    DOB: 06-12-1946    MRN: [id number]     Referring provider: Shirline Frees, MD    Brief patient profile:  77 yowf quit smoking 1975  followed for pulmonary nodules compatible with old granulomatous disease, PPD negative.  From [place] endemic area for histoplasmosis -she is a Theme park manager   Around March 5027 cyclical cough > resolved completely    12/15/2017 NP eval  Acute OV : Cough  Patient presents for an acute office visit.  Patient complains of 3 days of sore throat , dry cough , tender glands  And tightness . Took albuterol which helped some . She took mucinex D but did not help and made her feel worse, seems to dry her out too much . Denies fever, discolored mucus , chest pain , orthopnea or edema.  Says she had been doing very well until this past few days.  Has a big church dinner this week does not want to be sick.  rec Push Fluids and rest .  Zyrtec 10mg  daily As needed  Drainage  Mucinex Twice daily  As needed  Cough/congestion  Prednisone taper to have on hold if symptoms worse with wheezing  Follow up with Dr. Pennie Ellis in 4-6 months and As needed   Please contact office for sooner follow up if symptoms do not improve or worsen or seek emergency care    Took zpak, never took prednisone or zyrtec > sputum turned dark > took extra zmax x 2 days and mucus turned somewhat lighter  Then 2 weeks later cough turned  dry but  persisted day and noct then much worse for sev days > [facility] ER 02/07/18 with a nl CTa and dx of bronchitis > doxy x 10 days/ rx prednisone x 5 days > much better "for the first time"    02/16/2018  Post er  f/u ov/Sandra Ellis re: cough since second week in Apirl 2019  Chief Complaint  Patient presents with  . Hospitalization Follow-up    Went to ED in [place] on 02/07/18 with cough- dxed with bronchitis and put on doxy. She states the cough is much improved.   Dyspnea:   Not limited by breathing from desired activities  But not  aerobic Cough: still some dry coughing with voice use only, no other cough/ using lots of cough drops  Sleep: fine now  rec  sinus ct  03/03/18 neg  GERD   Diet    03/03/2018 acute extended ov/Sandra Ellis re: cough since around 12/01/17 Chief Complaint  Patient presents with  . Acute Visit    Increased SOB and cough x 2 days. Her cough has been prod this morning but unsure of sputum color. She has been using her albuterol inhaler over the past few days.   cough never completely resolved except the noct resolved on delsym and an allergy pill otc clariton  Sporadic during the day not taking delsym Then one hour after hs awakened with coughing fits/ occurs also before bfast (takes fish oil and co q 10 at hs)  Cough  Severe spells/  dry hacking with burning cp radiates to back   Only with coughing  Not much relief with saba  rec Prednisone 10 mg take  4 each am x 2 days,   2 each am x 2 days,  1 each am x 2 days and stop  Take delsym two tsp every 12 hours and supplement if needed with  tramadol 50 mg up  to 1-2 every 4 hours to suppress the urge to cough.  Once you have eliminated the cough for 3 straight days try reducing the tramadol first,  then the delsym as tolerated.   Pantoprazole (protonix) 40 mg   Take  30-60 min before first meal of the day and Pepcid (famotidine)  20 mg one after supper  For drainage / throat tickle try take CHLORPHENIRAMINE  4 mg - take one every 4 hours as needed - available over the counter- may cause drowsiness so start with just a bedtime dose or two and see how you tolerate it before trying in daytime   Stop the fish oil and co q 10 unless you can get the powder form and then when cough completely gone without the need for cough medications ok to try to resume but take  with bfrast or lunch but never at bedtime Stop inhaler  Please schedule a follow up office visit in 4 weeks, sooner if needed  with all medications /inhalers/ solutions in hand so we can verify exactly what  you are taking. This includes all medications from all doctors and over the counters     03/18/18 NP rec Starting you on a daily maintenance inhaler called Symbicort     03/20/18 NP rec Add Singulair - for allergies  Continue symbicort as prescribed Rescue inahler - for shortness of breath Take mucinex twice daily- for congestion Delsym as needed- for cough Pulmonary function tests normal today          03/31/2018  f/u ov/Sandra Ellis re:  Coughing since early April after jet travel late march 2019 / did not bring all meds as req Chief Complaint  Patient presents with  . Follow-up    Cough is unchanged. She has not needed her albuterol inhaler.   Dyspnea:  Not limited by breathing from desired activities   Cough: non stop dry hacking immediately p stirring in am   Sleeping:  tessilon every night sleeps one pillow  SABA use: not using     No obvious day to day or daytime variability or assoc excess/ purulent sputum or mucus plugs or hemoptysis or cp or chest tightness, subjective wheeze or overt sinus or hb symptoms.   Sleeps most nocts without nocturnal  or early am exacerbation  of respiratory  c/o's or need for noct saba. Also denies any obvious fluctuation of symptoms with weather or environmental changes or other aggravating or alleviating factors except as outlined above   No unusual exposure hx or h/o childhood pna/ asthma or knowledge of premature birth.  Current Allergies, Complete Past Medical History, Past Surgical History, Family History, and Social History were reviewed in [facility] record.  ROS  The following are not active complaints unless bolded Hoarseness, sore throat, dysphagia, dental problems, itching, sneezing,  nasal congestion or discharge of excess mucus or purulent secretions, ear ache,   fever, chills, sweats, unintended wt loss or wt gain, classically pleuritic or exertional cp,  orthopnea pnd or arm/hand swelling  or leg swelling,  presyncope, palpitations, abdominal pain, anorexia, nausea, vomiting, diarrhea  or change in bowel habits or change in bladder habits, change in stools or change in urine, dysuria, hematuria,  rash, arthralgias, visual complaints, headache, numbness, weakness or ataxia or problems with walking or coordination,  change in mood or  memory.        Current Meds  Medication Sig  . albuterol (PROVENTIL HFA;VENTOLIN HFA) 108 (90 Base) MCG/ACT inhaler Inhale 2 puffs  into the lungs every 6 (six) hours as needed for wheezing or shortness of breath.  Marland Kitchen atenolol (TENORMIN) 25 MG tablet Take 12.5 mg by mouth at bedtime.   . benzonatate (TESSALON) 200 MG capsule Take 1 capsule (200 mg total) by mouth 3 (three) times daily as needed for cough.  . budesonide-formoterol (SYMBICORT) 80-4.5 MCG/ACT inhaler Inhale 2 puffs into the lungs 2 (two) times daily.  Marland Kitchen CINNAMON PO Take 1 tablet by mouth 1 day or 1 dose.  . Coenzyme Q10 (CO Q-10) 300 MG CAPS Take 300 mg by mouth daily.  Marland Kitchen levothyroxine (SYNTHROID, LEVOTHROID) 75 MCG tablet Take 75 mcg by mouth daily before breakfast.   . Magnesium 250 MG TABS Take 250 mg by mouth daily at 2 PM.  . montelukast (SINGULAIR) 10 MG tablet Take 1 tablet (10 mg total) by mouth at bedtime.  . Omega-3 Fatty Acids (FISH OIL) 1200 MG CAPS Take 1,200 mg by mouth 2 (two) times daily.  . potassium gluconate 595 (99 K) MG TABS tablet Take 595 mg by mouth daily at 2 PM.  . Probiotic Product (PROBIOTIC PO) Take 2 tablets by mouth daily.  . rosuvastatin (CRESTOR) 5 MG tablet Take 5 mg by mouth every other day.   . tretinoin (RETIN-A) 0.1 % cream Apply 1 application topically at bedtime. APPLIED TO FACE                   PEx  amb talkative wf nad / freq throat clearing    03/31/2018       139  03/03/2018         140   02/16/18 141 lb (64 kg)  12/15/17 140 lb (63.5 kg)  09/30/17 138 lb 9.6 oz (62.9 kg)     HEENT: nl dentition, turbinates bilaterally, and oropharynx. Nl  external ear canals without cough reflex   NECK :  without JVD/Nodes/TM/ nl carotid upstrokes bilaterally   LUNGS: no acc muscle use,  Nl contour chest which is clear to A and P bilaterally without cough on insp or exp maneuvers   CV:  RRR  no s3 or murmur or increase in P2, and no edema   ABD:  soft and nontender with nl inspiratory excursion in the supine position. No bruits or organomegaly appreciated, bowel sounds nl  MS:  Nl gait/ ext warm without deformities, calf tenderness, cyanosis or clubbing No obvious joint restrictions   SKIN: warm and dry without lesions    NEURO:  alert, approp, nl sensorium with  no motor or cerebellar deficits apparent.             I personally reviewed images and agree with radiology impression as follows:   Chest CTa  02/07/18 No active cardiopulmonary disease. Prior granulomatous disease.    5.   Assessment & Plan:

## 2018-03-31 NOTE — Patient Instructions (Addendum)
Stop symbiocort and stay on singulair 10 mg daily   Tessilon 200 mg first thing in am then every 8 hours and supplement with delsym up to 12 hours as needed  - the goal is to stop all coughing and even the urge to clear your throat   GERD (REFLUX)  is an extremely common cause of respiratory symptoms just like yours , many times with no obvious heartburn at all.    It can be treated with medication, but also with lifestyle changes including elevation of the head of your bed (ideally with 6 inch  bed blocks),  Smoking cessation, avoidance of late meals, excessive alcohol, and avoid fatty foods, chocolate, peppermint, colas, red wine, and acidic juices such as orange juice.  NO MINT OR MENTHOL PRODUCTS SO NO COUGH DROPS   USE SUGARLESS CANDY INSTEAD (Jolley ranchers or Stover's or Life Savers) or even ice chips will also do - the key is to swallow to prevent all throat clearing. NO OIL BASED VITAMINS - use powdered substitutes.    If not better call to schedule a methacholine challenge test  Next and if this is negative we will need to refer you to [place]  Sandra Ellis who has a special interest in  This type of cough    .

## 2018-04-01 ENCOUNTER — Encounter: Payer: Self-pay | Admitting: Internal Medicine

## 2018-04-01 DIAGNOSIS — K219 Gastro-esophageal reflux disease without esophagitis: Secondary | ICD-10-CM | POA: Insufficient documentation

## 2018-04-01 DIAGNOSIS — J029 Acute pharyngitis, unspecified: Secondary | ICD-10-CM | POA: Diagnosis not present

## 2018-04-01 NOTE — Assessment & Plan Note (Addendum)
Onset around 12/12/17 - rx pred/ doxy 02/07/18 and improved but did not resolve - FENO 09/03/9415  =   16  - cyclical cough rx 4/0/8144  >  Did not use  it correctly  - Sinus CT 03/10/2018 > neg  - Allergy profile 03/18/18  >  Eos 0.0 /  IgE  202 RAST neg  - Spirometry 03/20/18   Nl s curvature  - Singulair 03/20/18 >>> - 03/31/2018 trial off symbicort/ continue singulair     Lack of cough resolution on a verified empirical regimen (which we have yet to establish)  could mean an alternative diagnosis, persistence of the disease state (eg sinusitis-ruled out  or bronchiectasis- which she is reported to have but not apparent since last ct ) , or inadequacy of currently available therapy (eg no medical rx available for non-acid gerd).   She has not committed to any program at this point long enough to tell whether it really helps her or not but clear to me this is upper airway, not asthma.   Will try tessalon to suppress cyclical cough since she says it helps at hs and again emphasize gerd diet, try off symbicort (not helping and may aggravate uacs) and then MCT before referral to [facility]/ Dr Joya Gaskins eval    I had an extended discussion with the patient reviewing all relevant studies completed to date and  lasting 15 to 20 minutes of a 25 minute visit    Each maintenance medication was reviewed in detail including most importantly the difference between maintenance and prns and under what circumstances the prns are to be triggered using an action plan format that is not reflected in the computer generated alphabetically organized AVS.    Please see AVS for specific instructions unique to this visit that I personally wrote and verbalized to the the pt in detail and then reviewed with pt  by my nurse highlighting any  changes in therapy recommended at today's visit to their plan of care.

## 2018-04-02 ENCOUNTER — Ambulatory Visit (HOSPITAL_COMMUNITY)
Admission: RE | Admit: 2018-04-02 | Discharge: 2018-04-02 | Disposition: A | Discharge status: Home / Self Care | Payer: Medicare Other | Source: Ambulatory Visit | Attending: Cardiovascular Disease | Admitting: Cardiovascular Disease

## 2018-04-02 ENCOUNTER — Encounter: Payer: Self-pay | Admitting: Gastroenterology

## 2018-04-02 DIAGNOSIS — R072 Precordial pain: Secondary | ICD-10-CM | POA: Insufficient documentation

## 2018-04-02 DIAGNOSIS — R079 Chest pain, unspecified: Secondary | ICD-10-CM | POA: Diagnosis not present

## 2018-04-02 LAB — POCT I-STAT CREATININE: Creatinine, Ser: 0.8 mg/dL (ref 0.44–1.00)

## 2018-04-02 MED ORDER — NITROGLYCERIN 0.4 MG SL SUBL
SUBLINGUAL_TABLET | SUBLINGUAL | Status: AC
  Filled 2018-04-02: qty 2

## 2018-04-02 MED ORDER — IOPAMIDOL (ISOVUE-370) INJECTION 76%
INTRAVENOUS | Status: AC
  Administered 2018-04-02: 100 mL
  Filled 2018-04-02: qty 100

## 2018-04-02 MED ORDER — NITROGLYCERIN 0.4 MG SL SUBL
0.8000 mg | SUBLINGUAL_TABLET | Freq: Once | SUBLINGUAL | Status: AC
  Administered 2018-04-02: 0.8 mg via SUBLINGUAL
  Filled 2018-04-02: qty 25

## 2018-04-02 NOTE — Progress Notes (Signed)
Patient ambulated out of department with spuse with no difficulty noted.

## 2018-04-02 NOTE — Progress Notes (Signed)
Ct complete. Patient voices complaint of headache. Patient drinking coffee at this time.

## 2018-04-05 ENCOUNTER — Encounter: Payer: Self-pay | Admitting: Cardiovascular Disease

## 2018-04-05 DIAGNOSIS — M79605 Pain in left leg: Secondary | ICD-10-CM | POA: Diagnosis not present

## 2018-04-05 DIAGNOSIS — M79662 Pain in left lower leg: Secondary | ICD-10-CM | POA: Diagnosis not present

## 2018-04-06 DIAGNOSIS — M79605 Pain in left leg: Secondary | ICD-10-CM | POA: Diagnosis not present

## 2018-04-17 ENCOUNTER — Encounter: Payer: Self-pay | Admitting: Gastroenterology

## 2018-04-17 ENCOUNTER — Other Ambulatory Visit: Payer: Self-pay | Admitting: Family Medicine

## 2018-04-17 DIAGNOSIS — E010 Iodine-deficiency related diffuse (endemic) goiter: Secondary | ICD-10-CM

## 2018-04-21 ENCOUNTER — Ambulatory Visit
Admission: RE | Admit: 2018-04-21 | Discharge: 2018-04-21 | Disposition: A | Discharge status: Home / Self Care | Payer: Medicare Other | Source: Ambulatory Visit | Attending: Family Medicine | Admitting: Family Medicine

## 2018-04-21 DIAGNOSIS — E039 Hypothyroidism, unspecified: Secondary | ICD-10-CM | POA: Diagnosis not present

## 2018-04-21 DIAGNOSIS — E010 Iodine-deficiency related diffuse (endemic) goiter: Secondary | ICD-10-CM

## 2018-04-21 DIAGNOSIS — M542 Cervicalgia: Secondary | ICD-10-CM | POA: Diagnosis not present

## 2018-04-23 ENCOUNTER — Encounter: Payer: Self-pay | Admitting: Gastroenterology

## 2018-04-23 ENCOUNTER — Encounter

## 2018-04-23 ENCOUNTER — Ambulatory Visit (INDEPENDENT_AMBULATORY_CARE_PROVIDER_SITE_OTHER): Payer: Medicare Other | Admitting: Gastroenterology

## 2018-04-23 ENCOUNTER — Ambulatory Visit: Payer: Medicare Other | Admitting: Pulmonary Disease

## 2018-04-23 VITALS — BP 136/74 | HR 56 | Ht 67.0 in | Wt 138.8 lb

## 2018-04-23 DIAGNOSIS — Z86010 Personal history of colonic polyps: Secondary | ICD-10-CM

## 2018-04-23 DIAGNOSIS — K219 Gastro-esophageal reflux disease without esophagitis: Secondary | ICD-10-CM | POA: Diagnosis not present

## 2018-04-23 DIAGNOSIS — R05 Cough: Secondary | ICD-10-CM

## 2018-04-23 DIAGNOSIS — R059 Cough, unspecified: Secondary | ICD-10-CM

## 2018-04-23 MED ORDER — OMEPRAZOLE 20 MG PO CPDR: 20.0000 mg | DELAYED_RELEASE_CAPSULE | Freq: Every day | ORAL | 11 refills | Status: DC

## 2018-04-23 MED ORDER — SUPREP BOWEL PREP KIT 17.5-3.13-1.6 GM/177ML PO SOLN: 1.0000 | ORAL | 0 refills | Status: DC

## 2018-04-23 NOTE — Progress Notes (Signed)
Chief Complaint: Cough  Referring Provider:  Shirline Frees, MD      ASSESSMENT AND PLAN;   #1. GERD with cough and burning type of chest pains. Neg pulmonary evaluation, cardiac evaluation, ENT evaluation.  Being worked up for goiter. Neg sinus CT, spirometry, CT coronary, CxR. She could have hiatal hernia, gastritis, rule out peptic ulcer disease, eosinophilic esophagitis or Candida esophagitis. #2. H/O Polyps (tubular adenomas) 11/2014 - Omeprazole 20mg  po qAM - If still with problems, add famotidine 20mg  at bed time. - Proceed with EGD and colonoscopy.  I have discussed the risks and benefits.  The risks including risk of perforation requiring laparotomy, bleeding after biopsies/polypectomy requiring blood transfusions and risks of anesthesia/sedation were discussed.  Rare risks of missing UGI and colorectal neoplasms were also discussed.  Alternatives were also given.  Patient is fully aware and agrees to proceed. All the questions were answered. Procedures will be scheduled in upcoming days.  Patient is to report immediately if there is any significant weight loss or excessive bleeding until then. Consent forms were given for review. -If still with problems would switch omeprazole to Protonix and give her GI cocktail. -Stop all nonsteroidals -Stop coffee, citrus drinks, sodas, chocolates, chewing gums and candy. -Do not eat 3 hours before going to sleep.   HPI:    Sandra Ellis is a 72 y.o. female  Had bronchitis in April 2019- treated with steroids, antibiotics Persistent cough with "burning" substernally No odynophagia or dysphagia Had neg CT sinuses, chest x-ray, coronary CT. "Dx with reflux" treated with famotidine Neg ENT eval Being [facility] for enlarged but stable  Advised to get EGD. No diarrhea or constipation (on MOM). No wt loss  Previous colonoscopy: 11/09/2014 ([facility]) 1 cm tubular adenoma, small internal hemorrhoids Past Medical History:  Diagnosis Date  . Elevated  cholesterol   . H/O blood clots 1990  . Hx of colonic polyp   . Hypothyroidism   . Pulmonary emboli [facility])     Past Surgical History:  Procedure Laterality Date  . COLONOSCOPY  11/09/2014   Colonic polyp status polypectomy. Small internal hemorrhoids. Tubular adenoma.  Marland Kitchen KNEE SURGERY     cyst removed  . TONSILLECTOMY    . 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 Diabetes Mother   . Asthma Mother   . Colon cancer Neg Hx   . Esophageal cancer Neg Hx   . Pancreatic cancer Neg Hx   . Stomach cancer Neg Hx     Social History   Tobacco Use  . Smoking status: Former Smoker    Packs/day: 0.50    Years: 8.00    Pack years: 4.00    Types: Cigarettes    Last attempt to quit: 09/02/1973    Years since quitting: 44.6  . Smokeless tobacco: Never Used  Substance Use Topics  . Alcohol use: No    Alcohol/week: 0.0 standard drinks  . Drug use: No    Current Outpatient Medications  Medication Sig Dispense Refill  . atenolol (TENORMIN) 25 MG tablet Take 12.5 mg by mouth at bedtime.     . Coenzyme Q10 (CO Q-10) 300 MG CAPS Take 300 mg by mouth daily.    Marland Kitchen levothyroxine (SYNTHROID, LEVOTHROID) 75 MCG tablet  Take 75 mcg by mouth daily before breakfast.     . Omega-3 Fatty Acids (FISH OIL) 1200 MG CAPS Take 1,200 mg by mouth daily.     . Probiotic Product (PROBIOTIC PO) Take 2 tablets by mouth daily as needed.     . rosuvastatin (CRESTOR) 5 MG tablet Take 5 mg by mouth every other day.     . tretinoin (RETIN-A) 0.1 % cream Apply 1 application topically at bedtime. APPLIED TO FACE     No current facility-administered medications for this visit.     Allergies  Allergen Reactions  . Levaquin [Levofloxacin In D5w] Other (See Comments)    NUMBNESS/REDNESS  .  Ciprofloxacin Rash and Other (See Comments)    NUMBNESS/REDNESS/RASH    Review of Systems:  Constitutional: Denies fever, chills, diaphoresis, appetite change and has fatigue.  HEENT: Denies photophobia, eye pain, redness, hearing loss, ear pain, congestion, sore throat, rhinorrhea, sneezing, mouth sores, neck pain, neck stiffness and tinnitus.   Respiratory: Has SOB, DOE, cough, No chest tightness,  and wheezing.   Cardiovascular: Denies chest pain, palpitations and leg swelling.  Genitourinary: Denies dysuria, urgency, frequency, hematuria, flank pain and difficulty urinating.  Musculoskeletal: Denies myalgias, back pain, joint swelling, arthralgias and gait problem.  Skin: No rash.  Neurological: Denies dizziness, seizures, syncope, weakness, light-headedness, numbness and headaches.  Hematological: Denies adenopathy. Easy bruising, personal or family bleeding history  Psychiatric/Behavioral: Has anxiety, no depression     Physical Exam:    BP 136/74   Pulse (!) 56   Ht 5\' 7"  (1.702 m)   Wt 138 lb 12.8 oz (63 kg)   BMI 21.74 kg/m  Filed Weights   04/23/18 0923  Weight: 138 lb 12.8 oz (63 kg)   Constitutional:  Well-developed, in no acute distress. Psychiatric: Normal mood and affect. Behavior is normal. HEENT: Pupils normal.  Conjunctivae are normal. No scleral icterus.  Prominent thyroid. Neck supple.  Cardiovascular: Normal rate, regular rhythm. No edema Pulmonary/chest: Effort normal and breath sounds decreased. No wheezing, rales or rhonchi. Abdominal: Soft, nondistended. Nontender. Bowel sounds active throughout. There are no masses palpable. No hepatomegaly. Rectal:  defered Neurological: Alert and oriented to person place and time. Skin: Skin is warm and dry. No rashes noted.  Data Reviewed: I have personally reviewed following labs and imaging studies  CBC: CBC Latest Ref Rng & Units 03/18/2018 01/23/2017 01/01/2017  WBC 4.0 - 10.5 K/uL 12.6(H) 9.5 15.4(H)    Hemoglobin 12.0 - 15.0 g/dL 14.4 12.8 11.8(L)  Hematocrit 36.0 - 46.0 % 43.2 37.4 34.4(L)  Platelets 150.0 - 400.0 K/uL 336.0 416(H) 246    CMP: CMP Latest Ref Rng & Units 04/02/2018 01/23/2017 01/01/2017  Glucose 65 - 99 mg/dL - 105(H) 176(H)  BUN 6 - 20 mg/dL - 10 11  Creatinine 0.44 - 1.00 mg/dL 0.80 0.77 0.71  Sodium 135 - 145 mmol/L - 128(L) 133(L)  Potassium 3.5 - 5.1 mmol/L - 3.8 3.7  Chloride 101 - 111 mmol/L - 94(L) 99(L)  CO2 22 - 32 mmol/L - 26 26  Calcium 8.9 - 10.3 mg/dL - 9.5 8.8(L)  Total Protein 6.5 - 8.1 g/dL - 6.7 -  Total Bilirubin 0.3 - 1.2 mg/dL - 0.6 -  Alkaline Phos 38 - 126 U/L - 88 -  AST 15 - 41 U/L - 21 -  ALT 14 - 54 U/L - 18 -    GFR: Estimated Creatinine Clearance: 63.6 mL/min (by C-G formula based on SCr of 0.8 mg/dL). Liver Function  Tests: No results for input(s): AST, ALT, ALKPHOS, BILITOT, PROT, ALBUMIN in the last 168 hours. No results for input(s): LIPASE, AMYLASE in the last 168 hours. No results for input(s): AMMONIA in the last 168 hours. Coagulation Profile: No results for input(s): INR, PROTIME in the last 168 hours. HbA1C: No results for input(s): HGBA1C in the last 72 hours. Lipid Profile: No results for input(s): CHOL, HDL, LDLCALC, TRIG, CHOLHDL, LDLDIRECT in the last 72 hours. Thyroid Function Tests: No results for input(s): TSH, T4TOTAL, FREET4, T3FREE, THYROIDAB in the last 72 hours. Anemia Panel: No results for input(s): VITAMINB12, FOLATE, FERRITIN, TIBC, IRON, RETICCTPCT in the last 72 hours.  No results found for this or any previous visit (from the past 240 hour(s)).    Radiology Studies: Ct Coronary Morph W/cta Cor W/score W/ca W/cm &/or Wo/cm  Addendum Date: 04/02/2018   ADDENDUM REPORT: 04/02/2018 15:13 EXAM: OVER-READ INTERPRETATION  CT CHEST The following report is an over-read performed by radiologist Dr. Eben Burow [facility] on 04/02/2018. This over-read does not include interpretation of cardiac or  coronary anatomy or pathology. The cardiac/coronary CT interpretation by the cardiologist is attached. COMPARISON:  None. FINDINGS: The visualized portions of the lower lung fields show no suspicious nodules, masses, or infiltrates. Calcified granuloma noted in the right middle lobe. The visualized portions of the mediastinum and chest wall are unremarkable. IMPRESSION: No significant non-cardiac abnormality in visualized portion of the thorax. Electronically Signed   By: Earle Gell M.D.   On: 04/02/2018 15:13   Result Date: 04/02/2018 CLINICAL DATA:  Chest pain EXAM: Cardiac CTA MEDICATIONS: Sub lingual nitro. 4mg  and lopressor 5mg  TECHNIQUE: The patient was scanned on a Siemens Force 161 slice scanner. Gantry rotation speed was 250 msecs. Collimation was .6 mm. A 100 kV prospective scan was triggered in the ascending thoracic aorta at 140 HU's Full mA was used between 35% and 75% of the R-R interval. Average HR during the scan was 52 bpm. The 3D data set was interpreted on a dedicated work station using MPR, MIP and VRT modes. A total of 80cc of contrast was used. FINDINGS: Non-cardiac: See separate report from [facility]. No significant findings on limited lung and soft tissue windows. Calcium Score: No calcium detected Coronary Arteries: Right dominant with no anomalies LM: Normal LAD: Normal IM: Large branching vessel normal D1: Normal D2: Normal Circumflex: Normal OM1: Normal RCA: Normal PDA: High take off normal PLA: Normal IMPRESSION: 1.  Calcium score 0 2.  Normal right dominant coronary arteries 3.  Normal aortic root 3.5 cm Jenkins Rouge Electronically Signed: By: Jenkins Rouge M.D. On: 04/02/2018 14:11   US Thyroid  Result Date: 04/22/2018 CLINICAL DATA:  Thyromegaly on exam EXAM: THYROID ULTRASOUND TECHNIQUE: Ultrasound examination of the thyroid gland and adjacent soft tissues was performed. COMPARISON:  06/21/2016 FINDINGS: Parenchymal Echotexture: Moderately heterogenous Isthmus: 8 mm  Right lobe: 6.6 x 3.0 x 2.5 cm, previously 6.2 x 3.6 x 2.5 cm Left lobe: 5.7 x 2.3 x 2.5 cm, previously 6.1 x 2.6 x 2.6 cm _________________________________________________________ Estimated total number of nodules >/= 1 cm: 0 Number of spongiform nodules >/=  2 cm not described below (TR1): 0 Number of mixed cystic and solid nodules >/= 1.5 cm not described below (TR2): 0 _________________________________________________________ Stable moderate heterogeneity and diffuse enlargement. Normal vascularity. The previous 9 mm right mid thyroid TR 4 nodule has resolved when compared to the prior study. New anterior right mid thyroid 3 mm hypoechoic nodule noted. This  would not meet criteria for any biopsy or follow-up. No adenopathy. IMPRESSION: Stable thyromegaly with heterogeneous echotexture. Interval resolution of the previously described 9 mm right mid to inferior TR 4 nodule. See above comment. The above is in keeping with the ACR TI-RADS recommendations - J Am Coll Radiol 2017;14:587-595. Electronically Signed   By: Jerilynn Mages.  Shick M.D.   On: 04/22/2018 08:36  Extensive notes were reviewed-I have reviewed Dr. Johnny Bridge notes -Upper airway cough syndrome versus cough variant asthma    Carmell Austria, MD 04/23/2018, 9:34 AM  Cc: Shirline Frees, MD

## 2018-04-23 NOTE — Patient Instructions (Signed)
If you are age 70 or older, your body mass index should be between 23-30. Your Body mass index is 21.74 kg/m. If this is out of the aforementioned range listed, please consider follow up with your Primary Care Provider.  If you are age 35 or younger, your body mass index should be between 19-25. Your Body mass index is 21.74 kg/m. If this is out of the aformentioned range listed, please consider follow up with your Primary Care Provider.  ' You have been scheduled for an endoscopy and colonoscopy. Please follow the written instructions given to you at your visit today. Please pick up your prep supplies at the pharmacy within the next 1-3 days. If you use inhalers (even only as needed), please bring them with you on the day of your procedure. Your physician has requested that you go to www.startemmi.com and enter the access code given to you at your visit today. This web site gives a general overview about your procedure. However, you should still follow specific instructions given to you by our office regarding your preparation for the procedure.  We have sent the following medications to your pharmacy for you to pick up at your convenience: Omeprazole 20 mg once daily Suprep  Thank you,  Dr. Jackquline Denmark

## 2018-04-28 ENCOUNTER — Ambulatory Visit: Payer: Medicare Other | Admitting: Pulmonary Disease

## 2018-04-28 ENCOUNTER — Telehealth: Payer: Self-pay

## 2018-04-28 NOTE — Telephone Encounter (Signed)
I spoke with her and told her that 5 days might not be  long enough to get results.  She is willing to continue and will let us know next week if she is starting to have results.  She says she is following through with the other recommendations made at the last office visit.

## 2018-05-07 NOTE — Telephone Encounter (Signed)
Sandra Ellis Can you please call and see how is she doing? Pl see previous comments Please see comment/plan as per last clinic note.  Thx [name]

## 2018-05-07 NOTE — Telephone Encounter (Signed)
Spoke with pt and she states she is doing much better now and knows to call back with any problems.

## 2018-05-08 DIAGNOSIS — H524 Presbyopia: Secondary | ICD-10-CM | POA: Diagnosis not present

## 2018-05-08 DIAGNOSIS — H43813 Vitreous degeneration, bilateral: Secondary | ICD-10-CM | POA: Diagnosis not present

## 2018-05-08 DIAGNOSIS — H2513 Age-related nuclear cataract, bilateral: Secondary | ICD-10-CM | POA: Diagnosis not present

## 2018-05-08 DIAGNOSIS — H11001 Unspecified pterygium of right eye: Secondary | ICD-10-CM | POA: Diagnosis not present

## 2018-05-12 DIAGNOSIS — M79662 Pain in left lower leg: Secondary | ICD-10-CM | POA: Insufficient documentation

## 2018-05-13 ENCOUNTER — Ambulatory Visit: Payer: Medicare Other | Admitting: Gastroenterology

## 2018-05-20 DIAGNOSIS — R7303 Prediabetes: Secondary | ICD-10-CM | POA: Diagnosis not present

## 2018-05-20 DIAGNOSIS — E039 Hypothyroidism, unspecified: Secondary | ICD-10-CM | POA: Diagnosis not present

## 2018-05-20 DIAGNOSIS — R030 Elevated blood-pressure reading, without diagnosis of hypertension: Secondary | ICD-10-CM | POA: Diagnosis not present

## 2018-05-20 DIAGNOSIS — Z23 Encounter for immunization: Secondary | ICD-10-CM | POA: Diagnosis not present

## 2018-05-20 DIAGNOSIS — E7800 Pure hypercholesterolemia, unspecified: Secondary | ICD-10-CM | POA: Diagnosis not present

## 2018-05-20 DIAGNOSIS — R002 Palpitations: Secondary | ICD-10-CM | POA: Diagnosis not present

## 2018-05-20 DIAGNOSIS — E010 Iodine-deficiency related diffuse (endemic) goiter: Secondary | ICD-10-CM | POA: Diagnosis not present

## 2018-05-25 DIAGNOSIS — M79662 Pain in left lower leg: Secondary | ICD-10-CM | POA: Diagnosis not present

## 2018-05-29 DIAGNOSIS — M79662 Pain in left lower leg: Secondary | ICD-10-CM | POA: Diagnosis not present

## 2018-06-01 DIAGNOSIS — M79662 Pain in left lower leg: Secondary | ICD-10-CM | POA: Diagnosis not present

## 2018-06-02 ENCOUNTER — Telehealth: Payer: Self-pay | Admitting: Gastroenterology

## 2018-06-02 NOTE — Telephone Encounter (Signed)
Pt had hip replacement surgery 12mth ago and has completely healed. She is scheduled for [facility] 06/09/18 and wants to know if she needs to have antibiotics prior to procedure. Discussed with her that our doctors do not give antibiotics prior to procedures but she states that she was told by Dr. Lyndel Safe she would need antibiotics. Please advise.

## 2018-06-02 NOTE — Telephone Encounter (Signed)
Patient states she had a hip replacement 18 months ago and last year Dr.Gupta did not want to do this procedure that she has scheduled for 10.8.19; bc of that. Patient wants to make sure it is okay to have procedure and if so does she need to take an antibiotic before it?

## 2018-06-02 NOTE — Telephone Encounter (Signed)
Pt aware.

## 2018-06-02 NOTE — Telephone Encounter (Signed)
I have reviewed the chart No need for antibiotics Can proceed with the procedures.

## 2018-06-04 DIAGNOSIS — M79662 Pain in left lower leg: Secondary | ICD-10-CM | POA: Diagnosis not present

## 2018-06-09 ENCOUNTER — Ambulatory Visit (AMBULATORY_SURGERY_CENTER): Payer: Medicare Other | Admitting: Gastroenterology

## 2018-06-09 ENCOUNTER — Encounter

## 2018-06-09 ENCOUNTER — Encounter: Payer: Self-pay | Admitting: Gastroenterology

## 2018-06-09 VITALS — BP 98/53 | HR 46 | Temp 98.0°F | Resp 11 | Ht 67.0 in | Wt 138.0 lb

## 2018-06-09 DIAGNOSIS — D123 Benign neoplasm of transverse colon: Secondary | ICD-10-CM

## 2018-06-09 DIAGNOSIS — Z860100 Personal history of colon polyps, unspecified: Secondary | ICD-10-CM

## 2018-06-09 DIAGNOSIS — K2950 Unspecified chronic gastritis without bleeding: Secondary | ICD-10-CM | POA: Diagnosis not present

## 2018-06-09 DIAGNOSIS — K2970 Gastritis, unspecified, without bleeding: Secondary | ICD-10-CM

## 2018-06-09 DIAGNOSIS — K219 Gastro-esophageal reflux disease without esophagitis: Secondary | ICD-10-CM

## 2018-06-09 DIAGNOSIS — K3189 Other diseases of stomach and duodenum: Secondary | ICD-10-CM | POA: Diagnosis not present

## 2018-06-09 DIAGNOSIS — K208 Other esophagitis: Secondary | ICD-10-CM | POA: Diagnosis not present

## 2018-06-09 MED ORDER — SODIUM CHLORIDE 0.9 % IV SOLN: 500.0000 mL | Freq: Once | INTRAVENOUS | Status: DC

## 2018-06-09 NOTE — Patient Instructions (Addendum)
YOU HAD AN ENDOSCOPIC PROCEDURE TODAY AT [facility]:   Refer to the procedure report that was given to you for any specific questions about what was found during the examination.  If the procedure report does not answer your questions, please call your gastroenterologist to clarify.  If you requested that your care partner not be given the details of your procedure findings, then the procedure report has been included in a sealed envelope for you to review at your convenience later.  YOU SHOULD EXPECT: Some feelings of bloating in the abdomen. Passage of more gas than usual.  Walking can help get rid of the air that was put into your GI tract during the procedure and reduce the bloating. If you had a lower endoscopy (such as a colonoscopy or flexible sigmoidoscopy) you may notice spotting of blood in your stool or on the toilet paper. If you underwent a bowel prep for your procedure, you may not have a normal bowel movement for a few days.  Please Note:  You might notice some irritation and congestion in your nose or some drainage.  This is from the oxygen used during your procedure.  There is no need for concern and it should clear up in a day or so.  SYMPTOMS TO REPORT IMMEDIATELY:   Following lower endoscopy (colonoscopy or flexible sigmoidoscopy):  Excessive amounts of blood in the stool  Significant tenderness or worsening of abdominal pains  Swelling of the abdomen that is new, acute  Fever of 100F or higher   Following upper endoscopy (EGD)  Vomiting of blood or coffee ground material  New chest pain or pain under the shoulder blades  Painful or persistently difficult swallowing  New shortness of breath  Fever of 100F or higher  Black, tarry-looking stools  For urgent or emergent issues, a gastroenterologist can be reached at any hour by calling [phone].   DIET:  We do recommend a small meal at first, but then you may proceed to your regular diet.  Drink  plenty of fluids but you should avoid alcoholic beverages for 24 hours.  ACTIVITY:  You should plan to take it easy for the rest of today and you should NOT DRIVE or use heavy machinery until tomorrow (because of the sedation medicines used during the test).    FOLLOW UP: Our staff will call the number listed on your records the next business day following your procedure to check on you and address any questions or concerns that you may have regarding the information given to you following your procedure. If we do not reach you, we will leave a message.  However, if you are feeling well and you are not experiencing any problems, there is no need to return our call.  We will assume that you have returned to your regular daily activities without incident.  If any biopsies were taken you will be contacted by phone or by letter within the next 1-3 weeks.  Please call us at [phone] if you have not heard about the biopsies in 3 weeks.    SIGNATURES/CONFIDENTIALITY: You and/or your care partner have signed paperwork which will be entered into your electronic medical record.  These signatures attest to the fact that that the information above on your After Visit Summary has been reviewed and is understood.  Full responsibility of the confidentiality of this discharge information lies with you and/or your care-partner.  Gastritis information given. Polyp information given Avoid non steroidal anti inflammatory  medications. See Dr. Lyndel Safe in 12 weeks, call fo appointment.

## 2018-06-09 NOTE — Op Note (Signed)
[facility] Patient Name: Sandra Ellis Procedure Date: 06/09/2018 2:54 PM MRN: [id number] Endoscopist: Jackquline Denmark , MD Age: 72 Referring MD:  Date of Birth: 12-Jan-1946 Gender: Female Account #: [id number] Procedure:                Colonoscopy Indications:              High risk colon cancer surveillance: Personal                            history of colonic polyps Medicines:                Monitored Anesthesia Care Procedure:                Pre-Anesthesia Assessment:                           - Prior to the procedure, a History and Physical                            was performed, and patient medications and                            allergies were reviewed. The patient's tolerance of                            previous anesthesia was also reviewed. The risks                            and benefits of the procedure and the sedation                            options and risks were discussed with the patient.                            All questions were answered, and informed consent                            was obtained. Prior Anticoagulants: The patient has                            taken no previous anticoagulant or antiplatelet                            agents. ASA Grade Assessment: II - A patient with                            mild systemic disease. After reviewing the risks                            and benefits, the patient was deemed in                            satisfactory condition to undergo the procedure.  After obtaining informed consent, the colonoscope                            was passed under direct vision. Throughout the                            procedure, the patient's blood pressure, pulse, and                            oxygen saturations were monitored continuously. The                            Model PCF-H190DL [id number]) scope was introduced                            through the anus and advanced to the  2 cm into the                            ileum. The colonoscopy was performed without                            difficulty. The patient tolerated the procedure                            well. The quality of the bowel preparation was good. Scope In: 2:56:49 PM Scope Out: 3:10:01 PM Scope Withdrawal Time: 0 hours 9 minutes 3 seconds  Total Procedure Duration: 0 hours 13 minutes 12 seconds  Findings:                 A 6 mm polyp was found in the proximal transverse                            colon. The polyp was sessile. The polyp was removed                            with a cold snare. Resection and retrieval were                            complete. Estimated blood loss: none.                           Non-bleeding internal hemorrhoids were found during                            retroflexion. The hemorrhoids were small.                           The exam was otherwise without abnormality on                            direct and retroflexion views. Complications:            No immediate complications. Estimated Blood Loss:     Estimated blood loss: none. Impression:               -  One 6 mm polyp in the proximal transverse colon,                            removed with a cold snare. Resected and retrieved.                           - Non-bleeding internal hemorrhoids.                           - The examination was otherwise normal on direct                            and retroflexion views. Recommendation:           - Patient has a contact number available for                            emergencies. The signs and symptoms of potential                            delayed complications were discussed with the                            patient. Return to normal activities tomorrow.                            Written discharge instructions were provided to the                            patient.                           - Resume previous diet.                           - Continue  present medications.                           - Await pathology results.                           - Repeat colonoscopy for surveillance based on                            pathology results. Jackquline Denmark, MD 06/09/2018 3:13:58 PM This report has been signed electronically.

## 2018-06-09 NOTE — Progress Notes (Signed)
Spontaneous respirations throughout. VSS. Resting comfortably. To PACU on room air. Report to  RN. 

## 2018-06-09 NOTE — Op Note (Addendum)
[facility] Patient Name: Sandra Ellis Procedure Date: 06/09/2018 2:38 PM MRN: [id number] Endoscopist: Jackquline Denmark , MD Age: 72 Referring MD:  Date of Birth: 1945/11/15 Gender: Female Account #: [id number] Procedure:                Upper GI endoscopy Indications:              Epigastric abdominal pain/burning noncardiac chest                            pain Medicines:                Monitored Anesthesia Care Procedure:                Pre-Anesthesia Assessment:                           - Prior to the procedure, a History and Physical                            was performed, and patient medications and                            allergies were reviewed. The patient's tolerance of                            previous anesthesia was also reviewed. The risks                            and benefits of the procedure and the sedation                            options and risks were discussed with the patient.                            All questions were answered, and informed consent                            was obtained. Prior Anticoagulants: The patient has                            taken no previous anticoagulant or antiplatelet                            agents. ASA Grade Assessment: II - A patient with                            mild systemic disease. After reviewing the risks                            and benefits, the patient was deemed in                            satisfactory condition to undergo the procedure.  After obtaining informed consent, the endoscope was                            passed under direct vision. Throughout the                            procedure, the patient's blood pressure, pulse, and                            oxygen saturations were monitored continuously. The                            Endoscope was introduced through the mouth, and                            advanced to the second part of duodenum. The upper                             GI endoscopy was accomplished without difficulty.                            The patient tolerated the procedure well. Scope In: Scope Out: Findings:                 The Z-line was irregular and was found 36 cm from                            the incisors with islands of salmon-colored mucosa,                            also examined by NBI. Biopsies were taken with a                            cold forceps for histology directed by NBI.                            Biopsies were also obtained from the proximal and                            distal esophagus with cold forceps for histology to                            r/o eosinophilic esophagitis.                           Mild inflammation characterized by erythema was                            found in the gastric antrum. Biopsies were taken                            with a cold forceps for histology.  The duodenum was normal. Biopsies for histology                            were taken with a cold forceps for evaluation of                            celiac disease. Complications:            No immediate complications. Estimated Blood Loss:     Estimated blood loss: none. Impression:               - Z-line irregular, 36 cm from the incisors.                            Biopsied.                           - Mild gastritis. Biopsied. Recommendation:           - Patient has a contact number available for                            emergencies. The signs and symptoms of potential                            delayed complications were discussed with the                            patient. Return to normal activities tomorrow.                            Written discharge instructions were provided to the                            patient.                           - Resume previous diet.                           - Continue present medications. Continue Prilosec                             20 mg p.o. once a day.                           - Await pathology results.                           - Avoid nonsteroidals.                           - Return to my office in 12 weeks. Jackquline Denmark, MD 06/09/2018 3:18:43 PM This report has been signed electronically.

## 2018-06-09 NOTE — Progress Notes (Signed)
Called to room to assist during endoscopic procedure.  Patient ID and intended procedure confirmed with present staff. Received instructions for my participation in the procedure from the performing physician.  

## 2018-06-10 ENCOUNTER — Telehealth: Payer: Self-pay | Admitting: Gastroenterology

## 2018-06-10 ENCOUNTER — Telehealth: Payer: Self-pay | Admitting: *Deleted

## 2018-06-10 NOTE — Telephone Encounter (Signed)
Left message for pt to call back  °

## 2018-06-10 NOTE — Telephone Encounter (Signed)
  Follow up Call-  Call back number 06/09/2018  Post procedure Call Back phone  # [phone]  Permission to leave phone message Yes  Some recent data might be hidden     Patient questions:  Do you have a fever, pain , or abdominal swelling? No. Pain Score  0 *  Have you tolerated food without any problems? Yes.    Have you been able to return to your normal activities? Yes.    Do you have any questions about your discharge instructions: Diet   No. Medications  No. Follow up visit  No.  Do you have questions or concerns about your Care? No.  Actions: * If pain score is 4 or above: No action needed, pain <4.  Pt. Stated that she had pain yesterday level of 3 after egd,asked if she has eaten anything today she stated "NO, explained to her that it is normal to have soreness after egd she can do warm salt water gargles and even encouraged her to eat some food today and if she has pain that worsen to let us know,she verbalize understanding.

## 2018-06-11 DIAGNOSIS — M79662 Pain in left lower leg: Secondary | ICD-10-CM | POA: Diagnosis not present

## 2018-06-12 ENCOUNTER — Encounter: Payer: Self-pay | Admitting: Gastroenterology

## 2018-06-12 NOTE — Telephone Encounter (Signed)
See additional phone note. 

## 2018-06-18 DIAGNOSIS — M79662 Pain in left lower leg: Secondary | ICD-10-CM | POA: Diagnosis not present

## 2018-06-19 DIAGNOSIS — M79662 Pain in left lower leg: Secondary | ICD-10-CM | POA: Diagnosis not present

## 2018-06-26 DIAGNOSIS — M79662 Pain in left lower leg: Secondary | ICD-10-CM | POA: Diagnosis not present

## 2018-06-29 DIAGNOSIS — M79662 Pain in left lower leg: Secondary | ICD-10-CM | POA: Diagnosis not present

## 2018-07-08 DIAGNOSIS — D485 Neoplasm of uncertain behavior of skin: Secondary | ICD-10-CM | POA: Diagnosis not present

## 2018-07-08 DIAGNOSIS — C44719 Basal cell carcinoma of skin of left lower limb, including hip: Secondary | ICD-10-CM | POA: Diagnosis not present

## 2018-07-08 DIAGNOSIS — L570 Actinic keratosis: Secondary | ICD-10-CM | POA: Diagnosis not present

## 2018-07-08 DIAGNOSIS — Z23 Encounter for immunization: Secondary | ICD-10-CM | POA: Diagnosis not present

## 2018-07-09 DIAGNOSIS — R7303 Prediabetes: Secondary | ICD-10-CM | POA: Diagnosis not present

## 2018-07-28 DIAGNOSIS — C44719 Basal cell carcinoma of skin of left lower limb, including hip: Secondary | ICD-10-CM | POA: Diagnosis not present

## 2018-08-02 DIAGNOSIS — L03116 Cellulitis of left lower limb: Secondary | ICD-10-CM | POA: Diagnosis not present

## 2018-08-04 DIAGNOSIS — T148XXA Other injury of unspecified body region, initial encounter: Secondary | ICD-10-CM | POA: Diagnosis not present

## 2018-08-04 DIAGNOSIS — C44719 Basal cell carcinoma of skin of left lower limb, including hip: Secondary | ICD-10-CM | POA: Diagnosis not present

## 2018-08-20 DIAGNOSIS — T148XXA Other injury of unspecified body region, initial encounter: Secondary | ICD-10-CM | POA: Diagnosis not present

## 2018-09-07 DIAGNOSIS — U071 COVID-19: Secondary | ICD-10-CM

## 2018-09-07 HISTORY — DX: COVID-19: U07.1

## 2018-09-24 DIAGNOSIS — Z23 Encounter for immunization: Secondary | ICD-10-CM | POA: Diagnosis not present

## 2018-09-24 DIAGNOSIS — T1490XD Injury, unspecified, subsequent encounter: Secondary | ICD-10-CM | POA: Diagnosis not present

## 2018-10-22 ENCOUNTER — Telehealth: Payer: Self-pay | Admitting: Gastroenterology

## 2018-10-22 NOTE — Telephone Encounter (Signed)
Patient is scheduled for 3/3 for follow up after procedure in Dec. And to check if she should continue taking Nexium, wants to know if an appointment is needed for this.

## 2018-10-22 NOTE — Telephone Encounter (Signed)
Called and spoke with patient-patient reports she is taking Nexium 20 mg daily instead of the Prilosec as ordered; patient reports she feels the Nexium "works better for my acid reflux"; patient had upper gi endo and colonoscopy in December, was requested to have a follow up appt for medication management and was made an appt for follow up post procedures for 11/03/2018 arrival at 8:15am for an 8:30am appt; patient advised to call back to the office if questions/concerns arise; Patient verbalized understanding of information/instructions;

## 2018-10-29 ENCOUNTER — Encounter: Payer: Self-pay | Admitting: Women's Health

## 2018-10-29 DIAGNOSIS — M5417 Radiculopathy, lumbosacral region: Secondary | ICD-10-CM | POA: Diagnosis not present

## 2018-10-29 DIAGNOSIS — M545 Low back pain: Secondary | ICD-10-CM | POA: Diagnosis not present

## 2018-10-29 DIAGNOSIS — Z1231 Encounter for screening mammogram for malignant neoplasm of breast: Secondary | ICD-10-CM | POA: Diagnosis not present

## 2018-11-03 ENCOUNTER — Encounter: Payer: Self-pay | Admitting: Gastroenterology

## 2018-11-03 ENCOUNTER — Other Ambulatory Visit: Payer: Self-pay | Admitting: Gastroenterology

## 2018-11-03 ENCOUNTER — Ambulatory Visit (INDEPENDENT_AMBULATORY_CARE_PROVIDER_SITE_OTHER): Payer: Medicare Other | Admitting: Gastroenterology

## 2018-11-03 VITALS — BP 118/66 | HR 64 | Ht 67.0 in | Wt 141.0 lb

## 2018-11-03 DIAGNOSIS — K219 Gastro-esophageal reflux disease without esophagitis: Secondary | ICD-10-CM

## 2018-11-03 MED ORDER — ESOMEPRAZOLE MAGNESIUM 20 MG PO CPDR: 20.0000 mg | DELAYED_RELEASE_CAPSULE | Freq: Every day | ORAL | 11 refills | Status: DC

## 2018-11-03 MED ORDER — ESOMEPRAZOLE MAGNESIUM 20 MG PO PACK: 20.0000 mg | PACK | Freq: Every day | ORAL | 11 refills | Status: DC

## 2018-11-03 NOTE — Progress Notes (Signed)
Chief Complaint: FU  Referring Provider:  Shirline Frees, MD      ASSESSMENT AND PLAN;   #1. GERD with cough and burning type of chest pains. EGD 06/2018-irregular Z line, biopsies consistent with mild reflux.  Neg eso Bx for EoE, neg SB Bx for celiac. Neg pulmonary evaluation, cardiac evaluation, ENT evaluation.  Being worked up for goiter. Neg sinus CT, spirometry, CT coronary, CxR.  #2. H/O Polyps (tubular adenomas) 06/2018. Wants to get it done in 13yr instead of 7 yrs. (Recall 06/2023)  Plan: - Nexium 255mpo qAM. - If still with problems, add famotidine 2017mt bed time. -Stop all nonsteroidals -Stop coffee, citrus drinks, sodas, chocolates, chewing gums and candy. -Do not eat 3 hours before going to sleep. -RTC 1 yr. earlier, if with any new problems.   HPI:    Sandra Ellis a 72 49o. female  FU  Doing well Ate chilly- had cough and GERD Tolerating nexium 69m45m qd No further problems.  No significant cough or hoarseness.  Doing very well.  No diarrhea or constipation (on MOM).  No wt loss  Past Medical History:  Diagnosis Date  . Elevated cholesterol   . H/O blood clots 1990  . Hx of colonic polyp   . Hypothyroidism   [facility]  Past Surgical History:  Procedure Laterality Date  . COLONOSCOPY  11/09/2014   Colonic polyp status polypectomy. Small internal hemorrhoids. Tubular adenoma.  . KNMarland KitchenE SURGERY     cyst removed  . POLYPECTOMY    . TONSILLECTOMY    . 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 VAMarland KitchenINAL HYSTERECTOMY     TVH    Family History  Problem Relation Age of Onset  . Diabetes Mother   . Asthma Mother   . Colon cancer Neg Hx   . Esophageal cancer Neg Hx   . Pancreatic cancer Neg Hx   . Stomach cancer Neg Hx     Social History   Tobacco Use  . Smoking  status: Former Smoker    Packs/day: 0.50    Years: 8.00    Pack years: 4.00    Types: Cigarettes    Last attempt to quit: 09/02/1973    Years since quitting: 45.2  . Smokeless tobacco: Never Used  Substance Use Topics  . Alcohol use: No    Alcohol/week: 0.0 standard drinks  . Drug use: No    Current Outpatient Medications  Medication Sig Dispense Refill  . atenolol (TENORMIN) 25 MG tablet Take 12.5 mg by mouth at bedtime.     . Coenzyme Q10 (CO Q-10) 300 MG CAPS Take 300 mg by mouth daily.    . esMarland Kitchenmeprazole (NEXIUM) 20 MG packet Take 20 mg by mouth daily before breakfast.    . levothyroxine (SYNTHROID, LEVOTHROID) 75 MCG tablet Take 75 mcg by mouth daily before breakfast.     . Magnesium 250 MG TABS Take 250 mg by mouth daily.     . Omega-3 Fatty Acids (FISH OIL) 1200 MG CAPS Take 1,200 mg by mouth daily.     . Probiotic Product (PROBIOTIC PO) Take 2 tablets by mouth daily as needed.     . rosuvastatin (CRESTOR) 5 MG tablet Take 5 mg by mouth every other day.     . SUManus GunningEL PREP KIT 17.5-3.13-1.6  GM/177ML SOLN Take 1 kit by mouth as directed. 354 mL 0  . tretinoin (RETIN-A) 0.1 % cream Apply 1 application topically at bedtime. APPLIED TO FACE     No current facility-administered medications for this visit.     Allergies  Allergen Reactions  . Levaquin [Levofloxacin In D5w] Other (See Comments)    NUMBNESS/REDNESS  . Ciprofloxacin Rash and Other (See Comments)    NUMBNESS/REDNESS/RASH    Review of Systems:  neg    Physical Exam:    BP 118/66   Pulse 64   Ht 5' 7"  (1.702 m)   Wt 141 lb (64 kg)   BMI 22.08 kg/m  Filed Weights   11/03/18 0840  Weight: 141 lb (64 kg)   Constitutional:  Well-developed, in no acute distress. Psychiatric: Normal mood and affect. Behavior is normal. HEENT: Pupils normal.  Conjunctivae are normal. No scleral icterus.  Prominent thyroid. Cardiovascular: Normal rate, regular rhythm. No edema Pulmonary/chest: Effort normal and breath  sounds decreased. No wheezing, rales or rhonchi. Abdominal: Soft, nondistended. Nontender. Bowel sounds active throughout. There are no masses palpable. No hepatomegaly. Rectal:  defered Neurological: Alert and oriented to person place and time.  Data Reviewed: I have personally reviewed following labs and imaging studies  CBC: CBC Latest Ref Rng & Units 03/18/2018 01/23/2017 01/01/2017  WBC 4.0 - 10.5 K/uL 12.6(H) 9.5 15.4(H)  Hemoglobin 12.0 - 15.0 g/dL 14.4 12.8 11.8(L)  Hematocrit 36.0 - 46.0 % 43.2 37.4 34.4(L)  Platelets 150.0 - 400.0 K/uL 336.0 416(H) 246    CMP: CMP Latest Ref Rng & Units 04/02/2018 01/23/2017 01/01/2017  Glucose 65 - 99 mg/dL - 105(H) 176(H)  BUN 6 - 20 mg/dL - 10 11  Creatinine 0.44 - 1.00 mg/dL 0.80 0.77 0.71  Sodium 135 - 145 mmol/L - 128(L) 133(L)  Potassium 3.5 - 5.1 mmol/L - 3.8 3.7  Chloride 101 - 111 mmol/L - 94(L) 99(L)  CO2 22 - 32 mmol/L - 26 26  Calcium 8.9 - 10.3 mg/dL - 9.5 8.8(L)  Total Protein 6.5 - 8.1 g/dL - 6.7 -  Total Bilirubin 0.3 - 1.2 mg/dL - 0.6 -  Alkaline Phos 38 - 126 U/L - 88 -  AST 15 - 41 U/L - 21 -  ALT 14 - 54 U/L - 18 -      Radiology Studies: No results found.Extensive notes were reviewed-I have reviewed Dr. Johnny Bridge notes -Upper airway cough syndrome versus cough variant asthma    Carmell Austria, MD 11/03/2018, 8:44 AM  Cc: Shirline Frees, MD

## 2018-11-03 NOTE — Patient Instructions (Signed)
If you are age 71 or older, your body mass index should be between 23-30. Your Body mass index is 22.08 kg/m. If this is out of the aforementioned range listed, please consider follow up with your Primary Care Provider.  If you are age 15 or younger, your body mass index should be between 19-25. Your Body mass index is 22.08 kg/m. If this is out of the aformentioned range listed, please consider follow up with your Primary Care Provider.   We have sent the following medications to your pharmacy for you to pick up at your convenience: Nexium 20 mg once daily  Thank you,  Dr. Jackquline Denmark

## 2018-11-03 NOTE — Addendum Note (Signed)
Addended by: Karena Addison on: 11/03/2018 01:10 PM   Modules accepted: Orders

## 2018-11-17 DIAGNOSIS — Z23 Encounter for immunization: Secondary | ICD-10-CM | POA: Diagnosis not present

## 2018-11-17 DIAGNOSIS — L570 Actinic keratosis: Secondary | ICD-10-CM | POA: Diagnosis not present

## 2018-11-18 DIAGNOSIS — E7800 Pure hypercholesterolemia, unspecified: Secondary | ICD-10-CM | POA: Diagnosis not present

## 2018-11-18 DIAGNOSIS — G43909 Migraine, unspecified, not intractable, without status migrainosus: Secondary | ICD-10-CM | POA: Diagnosis not present

## 2018-11-18 DIAGNOSIS — N182 Chronic kidney disease, stage 2 (mild): Secondary | ICD-10-CM | POA: Diagnosis not present

## 2018-11-18 DIAGNOSIS — Z1159 Encounter for screening for other viral diseases: Secondary | ICD-10-CM | POA: Diagnosis not present

## 2018-11-18 DIAGNOSIS — R7303 Prediabetes: Secondary | ICD-10-CM | POA: Diagnosis not present

## 2018-11-18 DIAGNOSIS — E039 Hypothyroidism, unspecified: Secondary | ICD-10-CM | POA: Diagnosis not present

## 2018-11-18 DIAGNOSIS — Z0000 Encounter for general adult medical examination without abnormal findings: Secondary | ICD-10-CM | POA: Diagnosis not present

## 2018-11-18 DIAGNOSIS — J309 Allergic rhinitis, unspecified: Secondary | ICD-10-CM | POA: Diagnosis not present

## 2018-11-18 DIAGNOSIS — K219 Gastro-esophageal reflux disease without esophagitis: Secondary | ICD-10-CM | POA: Diagnosis not present

## 2018-11-18 DIAGNOSIS — R002 Palpitations: Secondary | ICD-10-CM | POA: Diagnosis not present

## 2018-11-24 DIAGNOSIS — J209 Acute bronchitis, unspecified: Secondary | ICD-10-CM | POA: Diagnosis not present

## 2018-11-24 DIAGNOSIS — J301 Allergic rhinitis due to pollen: Secondary | ICD-10-CM | POA: Diagnosis not present

## 2018-12-04 ENCOUNTER — Telehealth: Payer: Self-pay | Admitting: Gastroenterology

## 2018-12-07 NOTE — Telephone Encounter (Signed)
Lets try to Pepcid 20 mg p.o. twice daily for now Call if still with problems

## 2018-12-07 NOTE — Telephone Encounter (Signed)
Dr. Gupta please advise

## 2018-12-08 NOTE — Telephone Encounter (Signed)
Pt returned your call regarding Nexium.  She said that increased dose from 1 po d to bid is not helping.  Pt also reported "the same pain in her back."

## 2018-12-08 NOTE — Telephone Encounter (Signed)
Returned patients call and she stated she took two Nexium along with the Pepcid and she still had pain last night. I told her to only take the 1 Nexium at her regular time and then take the two pepcid at bedtime to see if that helps her any . She will call back if this does not help her

## 2018-12-09 NOTE — Telephone Encounter (Signed)
Thanks for taking care of that.  

## 2018-12-15 ENCOUNTER — Telehealth: Payer: Self-pay | Admitting: Gastroenterology

## 2018-12-15 NOTE — Telephone Encounter (Signed)
Dr. Lyndel Safe,  Pt states that she continues to have same symptoms that she had prior to starting Pepcid.  She has been taking two tablets of Pepcid qhs for 5 days. This is in addition to one Nexium at 5pm that she has been taking for years.  She c/o a burning sensation. Wonders if it is because she goes so long without taking any PPI's through out the day.  She would like your recommendations.

## 2018-12-15 NOTE — Telephone Encounter (Signed)
Lets try carafate (preferably elixir) 1g po bid x 2 weeks, 1 refill

## 2018-12-15 NOTE — Telephone Encounter (Signed)
Left message for pt to return call.

## 2018-12-15 NOTE — Telephone Encounter (Signed)
Pt stated that she has been taking famotidine 2 t qhs and would like a call back to discuss.

## 2018-12-16 ENCOUNTER — Other Ambulatory Visit: Payer: Self-pay

## 2018-12-16 MED ORDER — SUCRALFATE 1 GM/10ML PO SUSP: ORAL | 1 refills | Status: DC

## 2018-12-16 NOTE — Telephone Encounter (Signed)
Carafate 1g/56ml BID for 2 weeks with 1 refill sent to [facility] in [place]. Pt informed of this.  Pt mentioned that she is constipated as well and that she has been drinking lots of water but that is not helping.  I instructed her to continue with water intake and to start Miralax one capful daily. If one capful does not help then increase to two capfuls daily. Informed that this may cause diarrhea and that she would need to titrate as needed. Pt understood and she will call back in 2 weeks with an update on GERD.

## 2018-12-21 DIAGNOSIS — M545 Low back pain: Secondary | ICD-10-CM | POA: Diagnosis not present

## 2018-12-30 ENCOUNTER — Telehealth: Payer: Self-pay | Admitting: Gastroenterology

## 2018-12-30 ENCOUNTER — Other Ambulatory Visit: Payer: Self-pay

## 2018-12-30 MED ORDER — SUCRALFATE 1 GM/10ML PO SUSP: ORAL | 0 refills | Status: DC

## 2018-12-30 MED ORDER — FAMOTIDINE 40 MG PO TABS: 40.0000 mg | ORAL_TABLET | Freq: Every day | ORAL | 3 refills | Status: DC

## 2018-12-30 NOTE — Telephone Encounter (Signed)
Reviewed in detail with the patient on the telephone.  She agrees to this plan of treatment. She does mention costs of her medications and how she has to shop around for the best price. She will check her formulary to be sure of the tier her meds are on. Will let me know. Presently she will double up on the Nexium and Pepcid she already has. Pepcid was not on her medication list. New Rx sent for that. Carafate rx transmitted as well.

## 2018-12-30 NOTE — Telephone Encounter (Signed)
Lets do this -Take Nexium 40mg  po 1/2 hr before lunch. -Take Pepcid 40 mg nightly.  Just before she goes to bed. -Continue taking thyroid medicine in the morning as before. -Restart Carafate elixir 1g/58ml Bid - take 9 AM and 5 PM x 10 days. -If not better or recurrence of symptoms, proceed with upper GI series. -If still with problems, will obtain solid-phase gastric emptying scan.  Thx  [name]

## 2018-12-30 NOTE — Telephone Encounter (Signed)
Patient was better after 10 days of Sucralfate. She then had a return of the epigastric burning after a few days.  Reviewed medications. She is not taking the PPI until late in the day. She takes Levothyrxine in the mornings and does not know when it is acceptable to take Nexium. She is taking Levothyroxine at 5 am, takes a MV at noon, Nexium around 5 pm and then Pepcid around 10 pm. She was taking Sucralfate in the afternoon at around the same time as Pepcid.  Wants to know if she can go back on Sucralfate? Please recommend a better medication schedule.

## 2018-12-30 NOTE — Telephone Encounter (Signed)
Patient said that the medication sucralfate (CARAFATE) 1 GM/10ML is not working.

## 2018-12-31 ENCOUNTER — Other Ambulatory Visit: Payer: Self-pay

## 2018-12-31 ENCOUNTER — Telehealth: Payer: Self-pay | Admitting: Gastroenterology

## 2018-12-31 MED ORDER — OMEPRAZOLE 40 MG PO CPDR: DELAYED_RELEASE_CAPSULE | ORAL | 3 refills | Status: DC

## 2018-12-31 NOTE — Telephone Encounter (Signed)
Patient wants to change the Nexium to Prilosec 40 mg. She can get this without a co-pay. I have canceled Nexium and sent the Prilosec.

## 2018-12-31 NOTE — Telephone Encounter (Signed)
Patient called said if the med Pr[facility]c can be [facility] in [place].

## 2019-01-03 NOTE — Telephone Encounter (Signed)
Perfect. Thx!

## 2019-01-05 ENCOUNTER — Telehealth (INDEPENDENT_AMBULATORY_CARE_PROVIDER_SITE_OTHER): Payer: Medicare Other | Admitting: Gastroenterology

## 2019-01-05 ENCOUNTER — Other Ambulatory Visit: Payer: Self-pay

## 2019-01-05 ENCOUNTER — Encounter: Payer: Self-pay | Admitting: Gastroenterology

## 2019-01-05 VITALS — Ht 67.0 in | Wt 137.0 lb

## 2019-01-05 DIAGNOSIS — K219 Gastro-esophageal reflux disease without esophagitis: Secondary | ICD-10-CM

## 2019-01-05 DIAGNOSIS — Z86010 Personal history of colonic polyps: Secondary | ICD-10-CM

## 2019-01-05 MED ORDER — OMEPRAZOLE 40 MG PO CPDR: DELAYED_RELEASE_CAPSULE | ORAL | 3 refills | Status: DC

## 2019-01-05 MED ORDER — FAMOTIDINE 40 MG PO TABS: 40.0000 mg | ORAL_TABLET | Freq: Every day | ORAL | 3 refills | Status: DC

## 2019-01-05 NOTE — Patient Instructions (Signed)
To help prevent the possible spread of infection to our patients, communities, and staff; we will be implementing the following measures:  As of now we are not allowing any visitors/family members to accompany you to any upcoming appointments with [facility]. If you have any concerns about this please contact our office to discuss prior to the appointment.   Begin taking Prilosec 40mg  by mouth 30 minutes to 1 hour prior to lunch daily. Pepcid 40mg  daily at bedtime, decrease to 20mg  daily at bedtime after two weeks. You may use you carafate on an as needed basis until you run out.  Begin your gluten free diet.  Please call Dr. Leland Her nurse Faythe Casa, RN)  in 2 weeks at [phone]  to let her now how you are doing.   Thank you,  Dr. Jackquline Denmark

## 2019-01-05 NOTE — Progress Notes (Signed)
Chief Complaint: FU  Referring Provider:  Shirline Frees, MD      ASSESSMENT AND PLAN;   #1. GERD with cough and burning type of chest pains.  Has element of esophageal hypersensitivity. EGD 06/2018-irregular Z line with Bx c/w mild reflux.  Neg eso Bx for EoE, neg SB Bx for celiac. Neg pulm, cardiac & ENT eval. Neg sinus CT, spirometry, CT coronary, CxR.  #2. H/O Polyps (tubular adenomas) 06/2018. Wants to get it done in 25yrs instead of 7 yrs. (Recall 06/2023)  Plan: -Prilosec 40 mg p.o. half an hour before lunch (#30, 6 refills) and Pepcid 40 mg p.o. nightly for now (#30, 6 refills).  In 2 weeks, can reduce Pepcid to 20 mg p.o. nightly. -Will give her a trial of gluten-free diet. -Can use Carafate tablets on PRN basis. -She will call us in 2 weeks to let us know how she is doing. -If still with problems will give her low-dose amitriptyline 10 mg p.o. nightly.   HPI:    Sandra Ellis is a 73 y.o. female  FU  Started having problems with heartburn despite Nexium.  She wanted to switch to Prilosec as it helped her better.  No odynophagia or dysphagia.  Overall does feel better.  Has seen lately that gluten-containing foods would cause more problems.  Her celiac eval was negative.  [facility]- had cough and GERD. No significant cough or hoarseness.  Doing very well.  No diarrhea or constipation (on MOM).  No wt loss  Past Medical History:  Diagnosis Date  . Elevated cholesterol   . H/O blood clots 1990  . Hx of colonic polyp   . Hypothyroidism   . Pulmonary emboli [facility])     Past Surgical History:  Procedure Laterality Date  . COLONOSCOPY  11/09/2014   Colonic polyp status polypectomy. Small internal hemorrhoids. Tubular adenoma.  Marland Kitchen KNEE SURGERY     cyst removed  . POLYPECTOMY    . TONSILLECTOMY    . 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 Diabetes Mother   . Asthma Mother   . Colon cancer Neg Hx   . Esophageal cancer Neg Hx   . Pancreatic cancer Neg Hx   . Stomach cancer Neg Hx     Social History   Tobacco Use  . Smoking status: Former Smoker    Packs/day: 0.50    Years: 8.00    Pack years: 4.00    Types: Cigarettes    Last attempt to quit: 09/02/1973    Years since quitting: 45.3  . Smokeless tobacco: Never Used  Substance Use Topics  . Alcohol use: No    Alcohol/week: 0.0 standard drinks  . Drug use: No    Current Outpatient Medications  Medication Sig Dispense Refill  . atenolol (TENORMIN) 25 MG tablet Take 12.5 mg by mouth at bedtime.     . Coenzyme Q10 (CO Q-10) 300 MG CAPS Take 300 mg by mouth daily.    . famotidine (PEPCID) 40 MG tablet Take 1 tablet (40 mg total) by mouth daily. 30 tablet 3  . levothyroxine (SYNTHROID, LEVOTHROID) 75 MCG tablet Take 75 mcg by mouth daily before breakfast.     . Magnesium 250 MG TABS  Take 250 mg by mouth daily.     . Omega-3 Fatty Acids (FISH OIL) 1200 MG CAPS Take 1,200 mg by mouth daily.     . Probiotic Product (PROBIOTIC PO) Take 1 tablet by mouth daily as needed.     . rosuvastatin (CRESTOR) 5 MG tablet Take 5 mg by mouth every other day.     . sucralfate (CARAFATE) 1 GM/10ML suspension Take 22mL at 9 am and 5 pm for 10 days 140 mL 0  . tretinoin (RETIN-A) 0.1 % cream Apply 1 application topically at bedtime. APPLIED TO FACE    . omeprazole (PRILOSEC) 40 MG capsule Take 1 daily before lunch (Patient not taking: Reported on 01/05/2019) 30 capsule 3   No current facility-administered medications for this visit.     Allergies  Allergen Reactions  . Levaquin [Levofloxacin In D5w] Other (See Comments)    NUMBNESS/REDNESS  . Ciprofloxacin Rash and Other (See Comments)    NUMBNESS/REDNESS/RASH    Review of Systems:  neg    Physical  Exam:    Ht 5\' 7"  (1.702 m)   Wt 137 lb (62.1 kg) Comment: verbalized  BMI 21.46 kg/m  Filed Weights   01/05/19 1302  Weight: 137 lb (62.1 kg)   Not examined since it was a tele-visit  Data Reviewed: I have personally reviewed following labs and imaging studies  CBC: CBC Latest Ref Rng & Units 03/18/2018 01/23/2017 01/01/2017  WBC 4.0 - 10.5 K/uL 12.6(H) 9.5 15.4(H)  Hemoglobin 12.0 - 15.0 g/dL 14.4 12.8 11.8(L)  Hematocrit 36.0 - 46.0 % 43.2 37.4 34.4(L)  Platelets 150.0 - 400.0 K/uL 336.0 416(H) 246    CMP: CMP Latest Ref Rng & Units 04/02/2018 01/23/2017 01/01/2017  Glucose 65 - 99 mg/dL - 105(H) 176(H)  BUN 6 - 20 mg/dL - 10 11  Creatinine 0.44 - 1.00 mg/dL 0.80 0.77 0.71  Sodium 135 - 145 mmol/L - 128(L) 133(L)  Potassium 3.5 - 5.1 mmol/L - 3.8 3.7  Chloride 101 - 111 mmol/L - 94(L) 99(L)  CO2 22 - 32 mmol/L - 26 26  Calcium 8.9 - 10.3 mg/dL - 9.5 8.8(L)  Total Protein 6.5 - 8.1 g/dL - 6.7 -  Total Bilirubin 0.3 - 1.2 mg/dL - 0.6 -  Alkaline Phos 38 - 126 U/L - 88 -  AST 15 - 41 U/L - 21 -  ALT 14 - 54 U/L - 18 -      Radiology Studies: No results found.Extensive notes were reviewed-I have reviewed Dr. Johnny Bridge notes -Upper airway cough syndrome versus cough variant asthma   This service was provided via telemedicine.  The patient was located at home.  The provider was located in office.  The patient did consent to this telephone visit and is aware of possible charges through their insurance for this visit.  Time spent on call: 15 min  Carmell Austria, MD 01/05/2019, 1:44 PM  Cc: Shirline Frees, MD

## 2019-01-24 DIAGNOSIS — M940 Chondrocostal junction syndrome [Tietze]: Secondary | ICD-10-CM | POA: Diagnosis not present

## 2019-02-19 IMAGING — CT CT HEART MORP W/ CTA COR W/ SCORE W/ CA W/CM &/OR W/O CM
4 of 7 series · 8 of 20 positions shown, 9 images · IV contrast (APPLIED)
Comparison: None.

EXAM:
OVER-READ INTERPRETATION  CT CHEST

The following report is an over-read performed by radiologist Dr.
does not include interpretation of cardiac or coronary anatomy or
pathology. The cardiac/coronary CT interpretation by the
cardiologist is attached.
CLINICAL DATA: Chest pain
Cardiac CTA
MEDICATIONS:
Sub lingual nitro. 4mg and lopressor 5mg
TECHNIQUE: The patient was scanned on a Siemens Force [REDACTED]ice scanner. Gantry
rotation speed was 250 msecs. Collimation was .6 mm. A 100 kV
prospective scan was triggered in the ascending thoracic aorta at
140 HU's Full mA was used between 35% and 75% of the R-R interval.
Average HR during the scan was 52 bpm. The 3D data set was
interpreted on a dedicated work station using MPR, MIP and VRT
modes. A total of 80cc of contrast was used.

[Series 6: best diast 69 % · axial · 0.39mm/px · z∈[+1175,+1219]mm · 2 of 333 slices shown, 3 images]
[im 111/333  vessel]
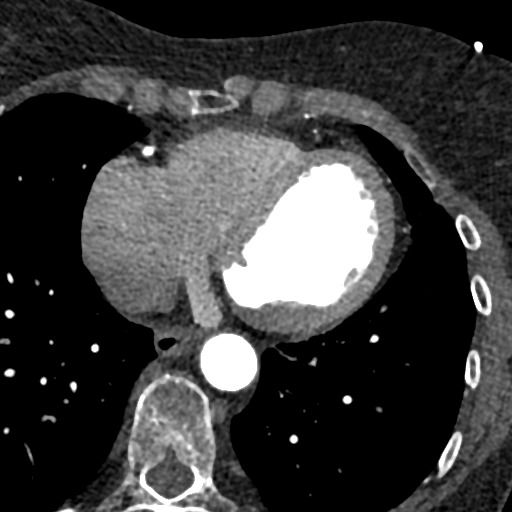
[im 111/333  lung]
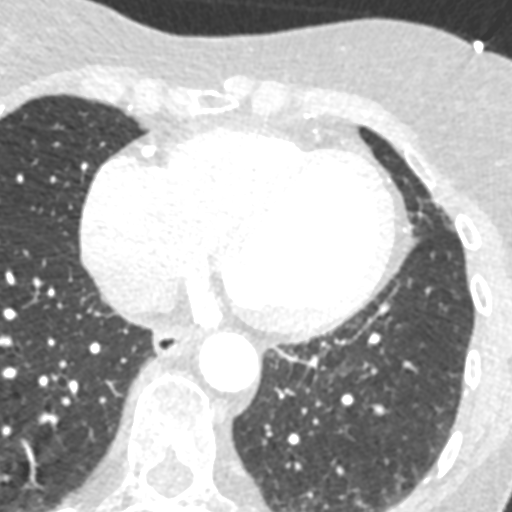
[im 222/333  vessel]
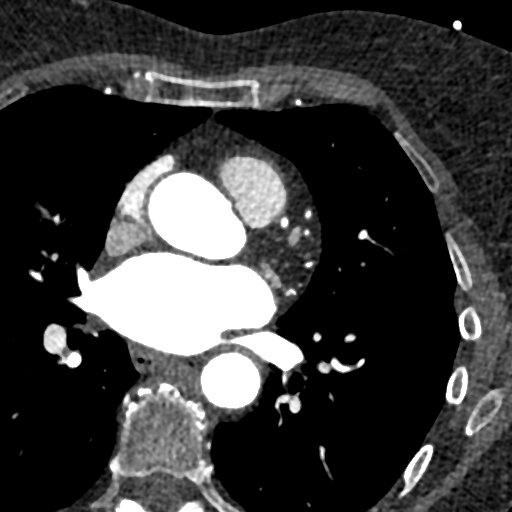

[Series 7: best syst 41 % · axial · 0.39mm/px · z∈[+1175,+1219]mm · 2 of 333 slices shown]
[im 111/333  vessel]
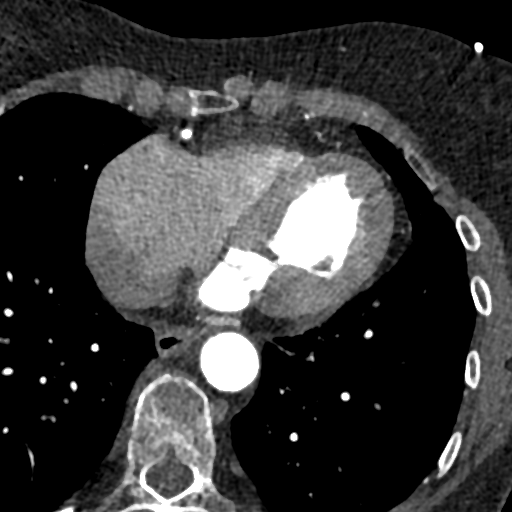
[im 222/333  vessel]
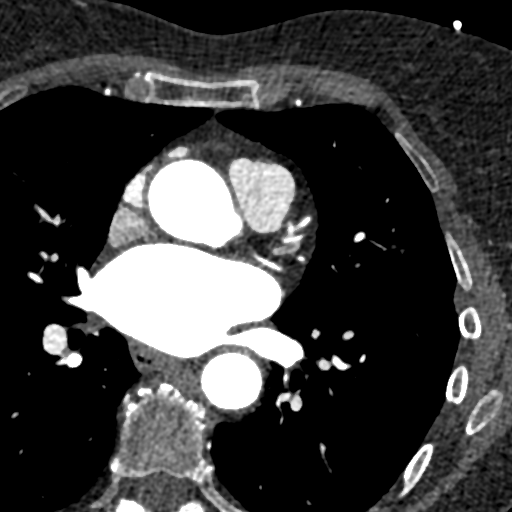

[Series 8: ts diast sharp 69 % · axial · 0.39mm/px · z∈[+1175,+1219]mm · 2 of 333 slices shown]
[im 111/333  lung]
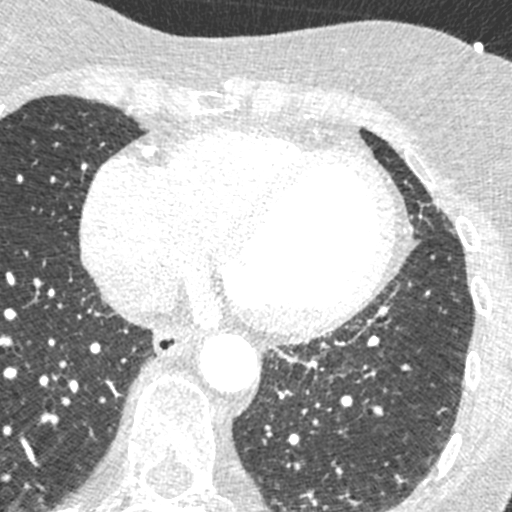
[im 222/333  lung]
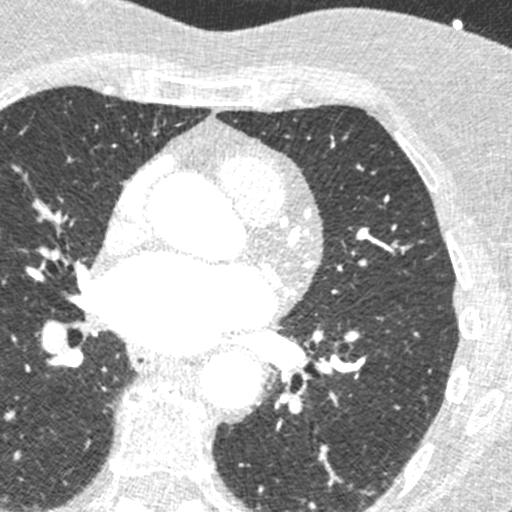

[Series 9: ts syst sharp 41 % · axial · 0.39mm/px · z∈[+1175,+1219]mm · 2 of 333 slices shown]
[im 111/333  lung]
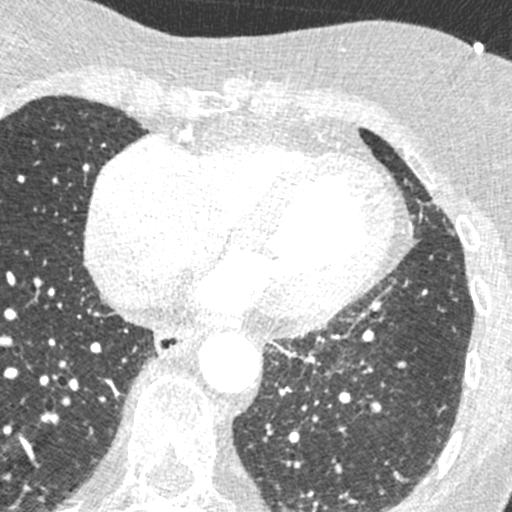
[im 222/333  lung]
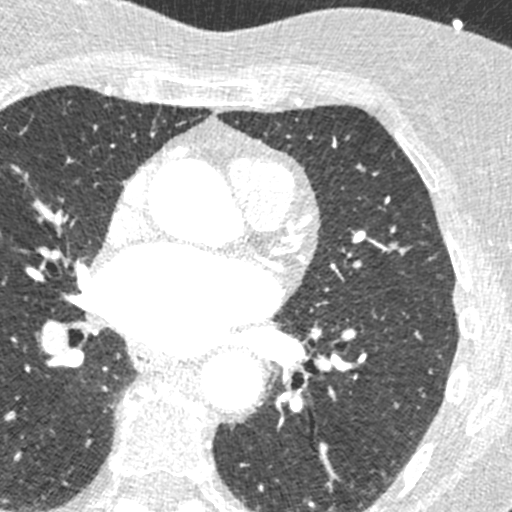

[8 of 20 positions shown; findings below may reference images not displayed]

FINDINGS: The visualized portions of the lower lung fields show no suspicious
nodules, masses, or infiltrates. Calcified granuloma noted in the
right middle lobe. The visualized portions of the mediastinum and
chest wall are unremarkable.
IMPRESSION: No significant non-cardiac abnormality in visualized portion of the
thorax.
FINDINGS: Non-cardiac: See separate report from [REDACTED]. No
significant findings on limited lung and soft tissue windows.

Calcium Score: No calcium detected

Coronary Arteries: Right dominant with no anomalies

LM: Normal

LAD: Normal

IM: Large branching vessel normal

D1: Normal

D2: Normal

Circumflex: Normal

OM1: Normal

RCA: Normal

PDA: High take off normal

PLA: Normal
IMPRESSION: 1.  Calcium score 0

2.  Normal right dominant coronary arteries

3.  Normal aortic root 3.5 cm

Raya Lue

## 2019-02-24 ENCOUNTER — Ambulatory Visit (INDEPENDENT_AMBULATORY_CARE_PROVIDER_SITE_OTHER): Payer: Medicare Other | Admitting: Women's Health

## 2019-02-24 ENCOUNTER — Telehealth: Payer: Self-pay | Admitting: *Deleted

## 2019-02-24 ENCOUNTER — Encounter: Payer: Self-pay | Admitting: Women's Health

## 2019-02-24 ENCOUNTER — Other Ambulatory Visit: Payer: Self-pay

## 2019-02-24 VITALS — BP 102/78

## 2019-02-24 DIAGNOSIS — N644 Mastodynia: Secondary | ICD-10-CM | POA: Diagnosis not present

## 2019-02-24 NOTE — Patient Instructions (Signed)
Vit E twice daily   Breast Tenderness Breast tenderness is a common problem for women of all ages. Breast tenderness may cause mild discomfort to severe pain. The pain usually comes and goes in association with your menstrual cycle, but it can be constant. Breast tenderness has many possible causes, including hormone changes and some medicines. Your health care provider may order tests, such as a mammogram or an ultrasound, to check for any unusual findings. Having breast tenderness usually does not mean that you have breast cancer. Follow these instructions at home: Sometimes, reassurance that you do not have breast cancer is all that is needed. In general, follow these home care instructions: Managing pain and discomfort   If directed, apply ice to the area: ? Put ice in a plastic bag. ? Place a towel between your skin and the bag. ? Leave the ice on for 20 minutes, 2-3 times a day.  Make sure you are wearing a supportive bra, especially during exercise. You may also want to wear a supportive bra while sleeping if your breasts are very tender. Medicines  Take over-the-counter and prescription medicines only as told by your health care provider. If the cause of your pain is infection, you may be prescribed an antibiotic medicine.  If you were prescribed an antibiotic, take it as told by your health care provider. Do not stop taking the antibiotic even if you start to feel better. General instructions   Your health care provider may recommend that you reduce the amount of fat in your diet. You can do this by: ? Limiting fried foods. ? Cooking foods using methods, such as baking, boiling, grilling, and broiling.  Decrease the amount of caffeine in your diet. You can do this by drinking more water and choosing caffeine-free options.  Keep a log of the days and times when your breasts are most tender.  Ask your health care provider how to do breast exams at home. This will help you notice  if you have an unusual growth or lump. Contact a health care provider if:  Any part of your breast is hard, red, and hot to the touch. This may be a sign of infection.  You are not breastfeeding and you have fluid, especially blood or pus, coming out of your nipples.  You have a fever.  You have a new or painful lump in your breast that remains after your menstrual period ends.  Your pain does not improve or it gets worse.  Your pain is interfering with your daily activities. This information is not intended to replace advice given to you by your health care provider. Make sure you discuss any questions you have with your health care provider. Document Released: 08/01/2008 Document Revised: 05/17/2016 Document Reviewed: 05/17/2016 Elsevier Interactive Patient Education  2019 [employer].

## 2019-02-24 NOTE — Telephone Encounter (Signed)
Patient scheduled at [facility] 03/04/19 2 10:15am, order faxed, patient informed.

## 2019-02-24 NOTE — Telephone Encounter (Signed)
-----   Message from Huel Cote, NP sent at 02/24/2019 12:24 PM EDT ----- Needs right breast diag mammogram ? Muscle or pain in rt breast for 7-10 days, has been lifting wt and doing more  housework than ususal.  No distinct mass, or erythema noted, pain is throughout breast.  Only day cant go is [place].  Normal mammogram at [facility]  10/2018

## 2019-02-24 NOTE — Progress Notes (Signed)
73 year old MWF G3, P3 presents with complaint of right breast tenderness, states feels like an electric shock throughout the breast.  Reports increased weight lifting exercise routine and recently has done more housework, getting deceased mother's home ready for sale.  Denies palpable nodule, nipple discharge, change in exam.  Normal mammogram history, last mammogram 10/2018.  Medical problems include hypothyroidism, hypertension, hypercholesteremia.  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  History of a PE.  Passenger transport manager.  Exam: Appears well.  Breast examined sitting and lying position without visible erythema, dimpling, retractions, nipple discharge, palpable nodules or masses.  Slight tenderness throughout right breast with exam.  Right breast mastodynia -  questionable muscle related  Plan: Diagnostic right breast mammogram will get scheduled, reviewed importance of decreasing amount of weights with weight lifting routine, rest, over-the-counter Motrin as needed for discomfort.  Could also try over-the-counter vitamin E twice daily.  Condolences given regarding death of mother.

## 2019-03-04 DIAGNOSIS — N644 Mastodynia: Secondary | ICD-10-CM | POA: Diagnosis not present

## 2019-03-24 ENCOUNTER — Encounter: Payer: Self-pay | Admitting: Women's Health

## 2019-04-08 NOTE — Telephone Encounter (Signed)
I called patient and read her this result note as her my Chart email was returned unread.

## 2019-04-20 DIAGNOSIS — L821 Other seborrheic keratosis: Secondary | ICD-10-CM | POA: Diagnosis not present

## 2019-04-20 DIAGNOSIS — L570 Actinic keratosis: Secondary | ICD-10-CM | POA: Diagnosis not present

## 2019-04-20 DIAGNOSIS — L245 Irritant contact dermatitis due to other chemical products: Secondary | ICD-10-CM | POA: Diagnosis not present

## 2019-04-21 DIAGNOSIS — Z20828 Contact with and (suspected) exposure to other viral communicable diseases: Secondary | ICD-10-CM | POA: Diagnosis not present

## 2019-04-30 ENCOUNTER — Ambulatory Visit (INDEPENDENT_AMBULATORY_CARE_PROVIDER_SITE_OTHER): Payer: Medicare Other | Admitting: Gynecology

## 2019-04-30 ENCOUNTER — Other Ambulatory Visit: Payer: Self-pay

## 2019-04-30 ENCOUNTER — Encounter: Payer: Self-pay | Admitting: Gynecology

## 2019-04-30 VITALS — BP 122/80

## 2019-04-30 DIAGNOSIS — N3000 Acute cystitis without hematuria: Secondary | ICD-10-CM | POA: Diagnosis not present

## 2019-04-30 DIAGNOSIS — R300 Dysuria: Secondary | ICD-10-CM | POA: Diagnosis not present

## 2019-04-30 MED ORDER — NITROFURANTOIN MONOHYD MACRO 100 MG PO CAPS: 100.0000 mg | ORAL_CAPSULE | Freq: Two times a day (BID) | ORAL | 0 refills | Status: DC

## 2019-04-30 NOTE — Patient Instructions (Signed)
Take the prescribed antibiotic twice daily for 7 days.  Follow-up if your symptoms persist, worsen or recur. 

## 2019-04-30 NOTE — Progress Notes (Signed)
    Sandra Ellis Sep 15, 1945 [id number]        73 y.o.  G3P3 presents with a week's history of mild dysuria, frequency and a little bit of urgency.  No low back pain fever or chills.  No vaginal symptoms such as discharge irritation or odor.  Past medical history,surgical history, problem list, medications, allergies, family history and social history were all reviewed and documented in the EPIC chart.  Directed ROS with pertinent positives and negatives documented in the history of present illness/assessment and plan.  Exam: Vitals:   04/30/19 1514  BP: 122/80   General appearance:  Normal Spine straight without CVA tenderness Abdomen soft nontender without mass guarding rebound  Assessment/Plan:  73 y.o. G3P3 with history and exam consistent with UTI.  Will treat with Macrobid 100 mg twice daily x7 days.  Follow-up if symptoms persist, worsen or recur.    Anastasio Auerbach MD, 3:40 PM 04/30/2019

## 2019-05-03 LAB — URINALYSIS, COMPLETE W/RFL CULTURE
Bilirubin Urine: NEGATIVE
Glucose, UA: NEGATIVE
Hyaline Cast: NONE SEEN /LPF
Ketones, ur: NEGATIVE
Nitrites, Initial: NEGATIVE
Protein, ur: NEGATIVE
Specific Gravity, Urine: 1.01 (ref 1.001–1.03)
pH: 6 (ref 5.0–8.0)

## 2019-05-03 LAB — URINE CULTURE
MICRO NUMBER:: 827651
SPECIMEN QUALITY:: ADEQUATE

## 2019-05-03 LAB — CULTURE INDICATED

## 2019-05-06 ENCOUNTER — Ambulatory Visit (INDEPENDENT_AMBULATORY_CARE_PROVIDER_SITE_OTHER): Payer: Medicare Other | Admitting: Gastroenterology

## 2019-05-06 ENCOUNTER — Encounter: Payer: Self-pay | Admitting: Gastroenterology

## 2019-05-06 ENCOUNTER — Other Ambulatory Visit: Payer: Self-pay

## 2019-05-06 VITALS — BP 124/74 | HR 63 | Temp 98.0°F | Ht 67.0 in | Wt 141.5 lb

## 2019-05-06 DIAGNOSIS — K219 Gastro-esophageal reflux disease without esophagitis: Secondary | ICD-10-CM

## 2019-05-06 DIAGNOSIS — Z86010 Personal history of colonic polyps: Secondary | ICD-10-CM

## 2019-05-06 MED ORDER — OMEPRAZOLE 40 MG PO CPDR: 40.0000 mg | DELAYED_RELEASE_CAPSULE | Freq: Every day | ORAL | 6 refills | Status: DC

## 2019-05-06 MED ORDER — FAMOTIDINE 40 MG PO TABS: 40.0000 mg | ORAL_TABLET | Freq: Every day | ORAL | 6 refills | Status: DC

## 2019-05-06 NOTE — Progress Notes (Signed)
Chief Complaint: FU  Referring Provider:  Shirline Frees, MD      ASSESSMENT AND PLAN;   #1. GERD with cough and burning type of chest pains.  Has element of esophageal hypersensitivity. EGD 06/2018-irregular Z line with Bx c/w mild reflux.  Neg eso Bx for EoE, neg SB Bx for celiac. Neg pulm, cardiac & ENT eval. Neg sinus CT, spirometry, CT coronary, CxR.  #2. H/O Polyps (tubular adenomas) 06/2018. Wants to get it done in 25yrs instead of 7 yrs. (Recall 06/2023)  Plan: -Prilosec 40 mg p.o. half an hour before lunch (#30, 6 refills) and Pepcid 40 mg p.o. nightly for now (#30, 6 refills).  -Will give her a trial of gluten-free diet. -Can use TUMS prn. -She will call us in 2 weeks to let us know how she is doing. -If still with problems will give her GI cocktail, followed Ba swallow and low-dose amitriptyline 10 mg p.o. nightly.   HPI:    Sandra Ellis is a 73 y.o. female  FU  Mom passed away in 2018/10/21- dealing with estate.  Has been under considerable stress.  Today has been to the bank, attorney's office and court house. Recent UTI on nitrofurantion, 1 day left.  Heartburn is much better.  Still there at times but does not bother her that much.  No odynophagia or dysphagia.  Tolerating Prilosec very well.  Has seen lately that gluten-containing foods would cause more problems.  Her celiac eval was negative.  No diarrhea or constipation (on MOM).  No wt loss  Past Medical History:  Diagnosis Date  . Elevated cholesterol   . H/O blood clots 1990  . Hx of colonic polyp   . Hypothyroidism   . Pulmonary emboli [facility])     Past Surgical History:  Procedure Laterality Date  . COLONOSCOPY  11/09/2014   Colonic polyp status polypectomy. Small internal hemorrhoids. Tubular adenoma.  Marland Kitchen KNEE SURGERY     cyst removed  . POLYPECTOMY    . TONSILLECTOMY    . 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 Diabetes Mother   . Asthma Mother   . Colon cancer Neg Hx   . Esophageal cancer Neg Hx   . Pancreatic cancer Neg Hx   . Stomach cancer Neg Hx     Social History   Tobacco Use  . Smoking status: Former Smoker    Packs/day: 0.50    Years: 8.00    Pack years: 4.00    Types: Cigarettes    Quit date: 09/02/1973    Years since quitting: 45.7  . Smokeless tobacco: Never Used  Substance Use Topics  . Alcohol use: No    Alcohol/week: 0.0 standard drinks  . Drug use: No    Current Outpatient Medications  Medication Sig Dispense Refill  . Ascorbic Acid (VITAMIN C PO) Take 1 tablet by mouth daily.    Marland Kitchen atenolol (TENORMIN) 25 MG tablet Take 12.5 mg by mouth at bedtime.     . Coenzyme Q10 (CO Q-10) 300 MG CAPS Take 300 mg by mouth daily.    . famotidine (PEPCID) 40 MG tablet Take 1 tablet (40 mg total) by mouth at bedtime. 30 tablet 3  . levothyroxine (SYNTHROID, LEVOTHROID)  75 MCG tablet Take 75 mcg by mouth daily before breakfast.     . Magnesium 250 MG TABS Take 250 mg by mouth daily.     . Multiple Vitamins-Minerals (HAIR SKIN AND NAILS FORMULA PO) Take 1 tablet by mouth daily.    . Multiple Vitamins-Minerals (ZINC PO) Take 1 tablet by mouth daily.    . Omega-3 Fatty Acids (FISH OIL) 1200 MG CAPS Take 1,200 mg by mouth daily.     Marland Kitchen omeprazole (PRILOSEC) 40 MG capsule Take 40mg  by mouth 30-60 minutes before lunch daily. 30 capsule 3  . Probiotic Product (PROBIOTIC PO) Take 1 tablet by mouth daily as needed.     . rosuvastatin (CRESTOR) 5 MG tablet Take 5 mg by mouth every other day.     . tretinoin (RETIN-A) 0.1 % cream Apply 1 application topically at bedtime. APPLIED TO FACE    . VITAMIN D PO Take 1 tablet by mouth daily.     No current facility-administered medications for this visit.     Allergies  Allergen Reactions   . Levaquin [Levofloxacin In D5w] Other (See Comments)    NUMBNESS/REDNESS  . Ciprofloxacin Rash and Other (See Comments)    NUMBNESS/REDNESS/RASH    Review of Systems:  neg    Physical Exam:    BP 124/74   Pulse 63   Temp 98 F (36.7 C)   Ht 5\' 7"  (1.702 m)   Wt 141 lb 8 oz (64.2 kg)   BMI 22.16 kg/m  Filed Weights   05/06/19 1530  Weight: 141 lb 8 oz (64.2 kg)   HEENT: No jaundice no pallor Respiratory: Bowel sounds are present CVS: S1-S2 normal S3-S4 Abdominal exam: Nontender bowel sounds are present no hepatosplenomegaly.  Data Reviewed: I have personally reviewed following labs and imaging studies  CBC: CBC Latest Ref Rng & Units 03/18/2018 01/23/2017 01/01/2017  WBC 4.0 - 10.5 K/uL 12.6(H) 9.5 15.4(H)  Hemoglobin 12.0 - 15.0 g/dL 14.4 12.8 11.8(L)  Hematocrit 36.0 - 46.0 % 43.2 37.4 34.4(L)  Platelets 150.0 - 400.0 K/uL 336.0 416(H) 246    CMP: CMP Latest Ref Rng & Units 04/02/2018 01/23/2017 01/01/2017  Glucose 65 - 99 mg/dL - 105(H) 176(H)  BUN 6 - 20 mg/dL - 10 11  Creatinine 0.44 - 1.00 mg/dL 0.80 0.77 0.71  Sodium 135 - 145 mmol/L - 128(L) 133(L)  Potassium 3.5 - 5.1 mmol/L - 3.8 3.7  Chloride 101 - 111 mmol/L - 94(L) 99(L)  CO2 22 - 32 mmol/L - 26 26  Calcium 8.9 - 10.3 mg/dL - 9.5 8.8(L)  Total Protein 6.5 - 8.1 g/dL - 6.7 -  Total Bilirubin 0.3 - 1.2 mg/dL - 0.6 -  Alkaline Phos 38 - 126 U/L - 88 -  AST 15 - 41 U/L - 21 -  ALT 14 - 54 U/L - 18 -      Radiology Studies: No results found.Extensive notes were reviewed-I have reviewed Dr. Johnny Bridge notes -Upper airway cough syndrome versus cough variant asthma   Carmell Austria, MD 05/06/2019, 3:56 PM  Cc: Shirline Frees, MD

## 2019-05-06 NOTE — Patient Instructions (Addendum)
If you are age 71 or older, your body mass index should be between 23-30. Your Body mass index is 22.16 kg/m. If this is out of the aforementioned range listed, please consider follow up with your Primary Care Provider.  If you are age 91 or younger, your body mass index should be between 19-25. Your Body mass index is 22.16 kg/m. If this is out of the aformentioned range listed, please consider follow up with your Primary Care Provider.   To help prevent the possible spread of infection to our patients, communities, and staff; we will be implementing the following measures:  As of now we are not allowing any visitors/family members to accompany you to any upcoming appointments with [facility]. If you have any concerns about this please contact our office to discuss prior to the appointment.   We have sent the following medications to your pharmacy for you to pick up at your convenience: Prilosec 40mg  daily half an hour before lunch. Pepcid 40mg  at bedtime.  Please purchase the following medications over the counter and take as directed: TUMS as needed.  Start your Gluten Free Diet.  Please call Dr. Leland Her nurse Faythe Casa, RN)  in 2 weeks at [phone]  to let her now how you are doing.   Thank you,  Dr. Jackquline Denmark

## 2019-05-13 ENCOUNTER — Telehealth: Payer: Self-pay

## 2019-05-13 MED ORDER — AMOXICILLIN-POT CLAVULANATE 500-125 MG PO TABS: 1.0000 | ORAL_TABLET | Freq: Two times a day (BID) | ORAL | 0 refills | Status: DC

## 2019-05-13 NOTE — Telephone Encounter (Signed)
Patient was in on 04/30/19 with UTI.  Treated with Macrobid x 7 days. Took all of the medication. Sx totally resolved. Now today she notices pressure feeling with urination again. She questioned if you would want her to come in and recheck her urine?

## 2019-05-13 NOTE — Telephone Encounter (Signed)
Augmentin 500/125 #14.  1 p.o. twice daily x7 days

## 2019-05-13 NOTE — Telephone Encounter (Signed)
Patient advised. Rx sent. 

## 2019-05-14 DIAGNOSIS — N390 Urinary tract infection, site not specified: Secondary | ICD-10-CM | POA: Diagnosis not present

## 2019-05-14 DIAGNOSIS — R300 Dysuria: Secondary | ICD-10-CM | POA: Diagnosis not present

## 2019-05-21 ENCOUNTER — Telehealth: Payer: Self-pay | Admitting: Gastroenterology

## 2019-05-21 NOTE — Telephone Encounter (Signed)
Continue Nexium 40 mg p.o. once a day She can weaned off famotidine to every other night x 2 weeks, then if she still feels better she can stop famotidine. In about 4 weeks, she can try Nexium every other day for now.  Do not stop yet.  RG

## 2019-05-21 NOTE — Telephone Encounter (Signed)
Please advise 

## 2019-05-21 NOTE — Telephone Encounter (Signed)
I have called and given patient directions of what Dr. Lyndel Safe wanted her to do. Patient voiced understanding.

## 2019-06-01 DIAGNOSIS — R05 Cough: Secondary | ICD-10-CM | POA: Diagnosis not present

## 2019-06-01 DIAGNOSIS — G43909 Migraine, unspecified, not intractable, without status migrainosus: Secondary | ICD-10-CM | POA: Diagnosis not present

## 2019-06-01 DIAGNOSIS — R7303 Prediabetes: Secondary | ICD-10-CM | POA: Diagnosis not present

## 2019-06-01 DIAGNOSIS — E039 Hypothyroidism, unspecified: Secondary | ICD-10-CM | POA: Diagnosis not present

## 2019-06-03 DIAGNOSIS — Z23 Encounter for immunization: Secondary | ICD-10-CM | POA: Diagnosis not present

## 2019-06-03 DIAGNOSIS — R7303 Prediabetes: Secondary | ICD-10-CM | POA: Diagnosis not present

## 2019-06-09 ENCOUNTER — Encounter: Payer: Self-pay | Admitting: Gynecology

## 2019-07-05 DIAGNOSIS — L718 Other rosacea: Secondary | ICD-10-CM | POA: Diagnosis not present

## 2019-07-05 DIAGNOSIS — L820 Inflamed seborrheic keratosis: Secondary | ICD-10-CM | POA: Diagnosis not present

## 2019-07-08 DIAGNOSIS — J029 Acute pharyngitis, unspecified: Secondary | ICD-10-CM | POA: Diagnosis not present

## 2019-07-20 ENCOUNTER — Telehealth: Payer: Self-pay | Admitting: Gastroenterology

## 2019-07-20 NOTE — Telephone Encounter (Signed)
I have called and spoke to patient she is taking medications how Dr. Lyndel Safe advised back in September. Michela Pitcher that she would call back to make an appointment in January to talk to him about stopping it all together.

## 2019-07-21 DIAGNOSIS — E039 Hypothyroidism, unspecified: Secondary | ICD-10-CM | POA: Diagnosis not present

## 2019-07-21 DIAGNOSIS — I10 Essential (primary) hypertension: Secondary | ICD-10-CM | POA: Diagnosis not present

## 2019-07-21 DIAGNOSIS — N182 Chronic kidney disease, stage 2 (mild): Secondary | ICD-10-CM | POA: Diagnosis not present

## 2019-07-21 DIAGNOSIS — E7800 Pure hypercholesterolemia, unspecified: Secondary | ICD-10-CM | POA: Diagnosis not present

## 2019-07-21 DIAGNOSIS — E119 Type 2 diabetes mellitus without complications: Secondary | ICD-10-CM | POA: Diagnosis not present

## 2019-08-03 DIAGNOSIS — D485 Neoplasm of uncertain behavior of skin: Secondary | ICD-10-CM | POA: Diagnosis not present

## 2019-09-09 DIAGNOSIS — B349 Viral infection, unspecified: Secondary | ICD-10-CM | POA: Diagnosis not present

## 2019-09-10 ENCOUNTER — Telehealth: Payer: Self-pay | Admitting: Unknown Physician Specialty

## 2019-09-10 ENCOUNTER — Other Ambulatory Visit: Payer: Self-pay | Admitting: Unknown Physician Specialty

## 2019-09-10 DIAGNOSIS — U071 COVID-19: Secondary | ICD-10-CM

## 2019-09-10 NOTE — Telephone Encounter (Signed)
  I connected by phone with Sandra Ellis on 09/10/2019 at 8:15 AM to discuss the potential use of an new treatment for mild to moderate COVID-19 viral infection in non-hospitalized patients.  This patient is a 75 y.o. female that meets the FDA criteria for Emergency Use Authorization of bamlanivimab or casirivimab\imdevimab.  Has a (+) direct SARS-CoV-2 viral test result  Has mild or moderate COVID-19   Is ? 72 years of age and weighs ? 40 kg  Is NOT hospitalized due to COVID-19  Is NOT requiring oxygen therapy or requiring an increase in baseline oxygen flow rate due to COVID-19  Is within 10 days of symptom onset  Has at least one of the high risk factor(s) for progression to severe COVID-19 and/or hospitalization as defined in EUA.  Specific high risk criteria : >/= 72 yo    I have spoken and communicated the following to the patient or parent/caregiver:  1. FDA has authorized the emergency use of bamlanivimab and casirivimab\imdevimab for the treatment of mild to moderate COVID-19 in adults and pediatric patients with positive results of direct SARS-CoV-2 viral testing who are 68 years of age and older weighing at least 40 kg, and who are at high risk for progressing to severe COVID-19 and/or hospitalization.  2. The significant known and potential risks and benefits of bamlanivimab and casirivimab\imdevimab, and the extent to which such potential risks and benefits are unknown.  3. Information on available alternative treatments and the risks and benefits of those alternatives, including clinical trials.  4. Patients treated with bamlanivimab and casirivimab\imdevimab should continue to self-isolate and use infection control measures (e.g., wear mask, isolate, social distance, avoid sharing personal items, clean and disinfect "high touch" surfaces, and frequent handwashing) according to CDC guidelines.   5. The patient or parent/caregiver has the option to accept or refuse  bamlanivimab or casirivimab\imdevimab .  After reviewing this information with the patient, The patient agreed to proceed with receiving the casirivimab\imdevimab infusion and will be provided a copy of the Fact sheet prior to receiving the infusion.Kathrine Haddock 09/10/2019 8:15 AM Sx onset 1/6

## 2019-09-13 ENCOUNTER — Ambulatory Visit (HOSPITAL_COMMUNITY)
Admission: RE | Admit: 2019-09-13 | Discharge: 2019-09-13 | Disposition: A | Discharge status: Home / Self Care | Payer: Medicare Other | Source: Ambulatory Visit | Attending: Pulmonary Disease | Admitting: Pulmonary Disease

## 2019-09-13 DIAGNOSIS — Z23 Encounter for immunization: Secondary | ICD-10-CM | POA: Insufficient documentation

## 2019-09-13 DIAGNOSIS — U071 COVID-19: Secondary | ICD-10-CM | POA: Diagnosis not present

## 2019-09-13 MED ORDER — FAMOTIDINE IN NACL 20-0.9 MG/50ML-% IV SOLN: 20.0000 mg | Freq: Once | INTRAVENOUS | Status: DC | PRN

## 2019-09-13 MED ORDER — METHYLPREDNISOLONE SODIUM SUCC 125 MG IJ SOLR: 125.0000 mg | Freq: Once | INTRAMUSCULAR | Status: DC | PRN

## 2019-09-13 MED ORDER — EPINEPHRINE 0.3 MG/0.3ML IJ SOAJ: 0.3000 mg | Freq: Once | INTRAMUSCULAR | Status: DC | PRN

## 2019-09-13 MED ORDER — ALBUTEROL SULFATE HFA 108 (90 BASE) MCG/ACT IN AERS: 2.0000 | INHALATION_SPRAY | Freq: Once | RESPIRATORY_TRACT | Status: DC | PRN

## 2019-09-13 MED ORDER — SODIUM CHLORIDE 0.9 % IV SOLN
Freq: Once | INTRAVENOUS | Status: AC
  Filled 2019-09-13: qty 10

## 2019-09-13 MED ORDER — DIPHENHYDRAMINE HCL 50 MG/ML IJ SOLN: 50.0000 mg | Freq: Once | INTRAMUSCULAR | Status: DC | PRN

## 2019-09-13 MED ORDER — SODIUM CHLORIDE 0.9 % IV SOLN: INTRAVENOUS | Status: DC | PRN

## 2019-09-13 NOTE — Progress Notes (Signed)
  Diagnosis: COVID-19  Physician: Dr. Joya Gaskins  Procedure: [facility] Med: casirivimab\imdevimab infusion - Provided patient with casirivimab\imdevimab fact sheet for patients, parents and caregivers prior to infusion.  Complications: No immediate complications noted.  Discharge: Discharged home   Tia Masker 09/13/2019

## 2019-09-13 NOTE — Discharge Instructions (Signed)

## 2019-09-23 ENCOUNTER — Telehealth: Payer: Self-pay | Admitting: Gastroenterology

## 2019-09-23 NOTE — Telephone Encounter (Signed)
Left message for patient to call back to the office;  

## 2019-09-23 NOTE — Telephone Encounter (Addendum)
Called and spoke with patient- patient reports her symptoms of reflux have worsened since being diagnosed w/ COVID on 09/08/2019 and having antibiotic tx;  Reports she did not take her acid reflux medication as prescribed while being "sick"; patient has requested an appt be made -but also has a f/u appt need per MD recommendations (to have been done in 09/2019);  Reports she still has a "cough that just won't go away and I am getting up some stuff that has color to it" "I am worried because I have been researching on the Internet and I don't know if this is something serious";  Patient has been scheduled for a VIRTUAL appt on 09/27/2019 at 10:00 am; Patient advised to call back to the office at [phone] should questions/concerns arise;  Patient verbalized understanding of information/instructions;

## 2019-09-27 ENCOUNTER — Telehealth (INDEPENDENT_AMBULATORY_CARE_PROVIDER_SITE_OTHER): Payer: Medicare Other | Admitting: Gastroenterology

## 2019-09-27 ENCOUNTER — Other Ambulatory Visit: Payer: Self-pay

## 2019-09-27 VITALS — Ht 67.0 in | Wt 136.0 lb

## 2019-09-27 DIAGNOSIS — R05 Cough: Secondary | ICD-10-CM | POA: Diagnosis not present

## 2019-09-27 DIAGNOSIS — R059 Cough, unspecified: Secondary | ICD-10-CM

## 2019-09-27 MED ORDER — ESOMEPRAZOLE MAGNESIUM 40 MG PO CPDR: 40.0000 mg | DELAYED_RELEASE_CAPSULE | Freq: Every day | ORAL | 6 refills | Status: DC

## 2019-09-27 NOTE — Progress Notes (Signed)
Chief Complaint: FU  Referring Provider:  Shirline Frees, MD      ASSESSMENT AND PLAN;   #1. GERD with cough and burning type of chest pains.  Has element of esophageal hypersensitivity. EGD 06/2018-irregular Z line with Bx c/w mild reflux.  Neg eso Bx for EoE, neg SB Bx for celiac. Neg pulm, cardiac & ENT eval. Neg sinus CT, spirometry, CT coronary, CxR.  #2. H/O Polyps (tubular adenomas) 06/2018. Wants to get it done in 47yrs instead of 7 yrs. (Recall 06/2023) #3.  Recent COVID-19 2019/10/23  Plan: -Nexium 40 mg p.o. QD, #30, 6 refills @ [facility] at [facility]. -Can use TUMS prn. -She will call us in 2 weeks to let us know how she is doing.   HPI:    Sandra Ellis is a 74 y.o. female  FU Had Covid-19 September 08, 2019-took hydrochloroquine for few days and got better. Not having any residual symptoms except cough Mostly since he stopped Nexium at that time.  Has restarted Nexium and the cough is much better.  No odynophagia or dysphagia.  Does feel significantly better.    From previous records: Mom passed away in 10-22-18  Has seen lately that gluten-containing foods would cause more problems.  Her celiac eval was negative.  No diarrhea or constipation (on MOM).  No wt loss  Past Medical History:  Diagnosis Date  . COVID-19 virus detected 09/07/2018  . Elevated cholesterol   . H/O blood clots 1990  . Hx of colonic polyp   . Hypothyroidism   . Pulmonary emboli [facility])     Past Surgical History:  Procedure Laterality Date  . COLONOSCOPY  11/09/2014   Colonic polyp status polypectomy. Small internal hemorrhoids. Tubular adenoma.  Marland Kitchen KNEE SURGERY     cyst removed  . POLYPECTOMY    . TONSILLECTOMY    . 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 Diabetes Mother   . Asthma Mother   . Colon cancer Neg Hx   . Esophageal cancer Neg Hx   . Pancreatic cancer Neg Hx   . Stomach cancer Neg Hx     Social History   Tobacco Use  . Smoking status: Former Smoker    Packs/day: 0.50    Years: 8.00    Pack years: 4.00    Types: Cigarettes    Quit date: 09/02/1973    Years since quitting: 46.0  . Smokeless tobacco: Never Used  Substance Use Topics  . Alcohol use: No    Alcohol/week: 0.0 standard drinks  . Drug use: No    Current Outpatient Medications  Medication Sig Dispense Refill  . Ascorbic Acid (VITAMIN C PO) Take 1 tablet by mouth daily.    Marland Kitchen atenolol (TENORMIN) 25 MG tablet Take 12.5 mg by mouth at bedtime.     . Cholecalciferol (VITAMIN D3 PO) Take 2 tablets by mouth daily.    . Coenzyme Q10 (CO Q-10) 300 MG CAPS Take 300 mg by mouth daily.    Marland Kitchen esomeprazole (NEXIUM) 20 MG capsule Take 20 mg by mouth 2 (two) times daily.    . famotidine (PEPCID) 40 MG tablet Take 1 tablet (40 mg total) by mouth at bedtime. 30 tablet 6  . levothyroxine (  SYNTHROID, LEVOTHROID) 75 MCG tablet Take 75 mcg by mouth daily before breakfast.     . Magnesium 250 MG TABS Take 250 mg by mouth daily.     . Multiple Vitamins-Minerals (HAIR SKIN AND NAILS FORMULA PO) Take 1 tablet by mouth daily.    . Multiple Vitamins-Minerals (ZINC PO) Take 1 tablet by mouth daily.    . Omega-3 Fatty Acids (FISH OIL) 1200 MG CAPS Take 2 capsules by mouth daily.     . Probiotic Product (PROBIOTIC PO) Take 1 tablet by mouth daily as needed.     . rosuvastatin (CRESTOR) 5 MG tablet Take 5 mg by mouth every other day.     . tretinoin (RETIN-A) 0.1 % cream Apply 1 application topically at bedtime. APPLIED TO FACE     No current facility-administered medications for this visit.    Allergies  Allergen Reactions  . Levaquin [Levofloxacin In D5w] Other (See Comments)    NUMBNESS/REDNESS  . Ciprofloxacin Rash and Other (See Comments)    NUMBNESS/REDNESS/RASH     Review of Systems:  neg    Physical Exam:    Ht 5\' 7"  (1.702 m)   Wt 136 lb (61.7 kg)   BMI 21.30 kg/m  Filed Weights   09/27/19 0824  Weight: 136 lb (61.7 kg)   Televisit  Data Reviewed: I have personally reviewed following labs and imaging studies  CBC: CBC Latest Ref Rng & Units 03/18/2018 01/23/2017 01/01/2017  WBC 4.0 - 10.5 K/uL 12.6(H) 9.5 15.4(H)  Hemoglobin 12.0 - 15.0 g/dL 14.4 12.8 11.8(L)  Hematocrit 36.0 - 46.0 % 43.2 37.4 34.4(L)  Platelets 150.0 - 400.0 K/uL 336.0 416(H) 246    CMP: CMP Latest Ref Rng & Units 04/02/2018 01/23/2017 01/01/2017  Glucose 65 - 99 mg/dL - 105(H) 176(H)  BUN 6 - 20 mg/dL - 10 11  Creatinine 0.44 - 1.00 mg/dL 0.80 0.77 0.71  Sodium 135 - 145 mmol/L - 128(L) 133(L)  Potassium 3.5 - 5.1 mmol/L - 3.8 3.7  Chloride 101 - 111 mmol/L - 94(L) 99(L)  CO2 22 - 32 mmol/L - 26 26  Calcium 8.9 - 10.3 mg/dL - 9.5 8.8(L)  Total Protein 6.5 - 8.1 g/dL - 6.7 -  Total Bilirubin 0.3 - 1.2 mg/dL - 0.6 -  Alkaline Phos 38 - 126 U/L - 88 -  AST 15 - 41 U/L - 21 -  ALT 14 - 54 U/L - 18 -      Radiology Studies: No results found.Extensive notes were reviewed-I have reviewed Dr. Johnny Bridge notes -Upper airway cough syndrome versus cough variant asthma  I connected with  [name] on 09/27/19 by a video enabled telemedicine application and verified that I am speaking with the correct person using two identifiers.   I discussed the limitations of evaluation and management by telemedicine. The patient expressed understanding and agreed to proceed.     Carmell Austria, MD 09/27/2019, 10:05 AM  Cc: Shirline Frees, MD

## 2019-09-27 NOTE — Patient Instructions (Signed)
If you are age 72 or older, your body mass index should be between 23-30. Your Body mass index is 21.3 kg/m. If this is out of the aforementioned range listed, please consider follow up with your Primary Care Provider.  If you are age 81 or younger, your body mass index should be between 19-25. Your Body mass index is 21.3 kg/m. If this is out of the aformentioned range listed, please consider follow up with your Primary Care Provider.   We have sent the following medications to your pharmacy for you to pick up at your convenience: Nexium  Can use Tums.   Please call Dr. Leland Her nurse Karl Pock, RN)  in 2 weeks at [phone]  to let her now how you are doing.   Thank you,  Dr. Jackquline Denmark

## 2019-10-11 DIAGNOSIS — L570 Actinic keratosis: Secondary | ICD-10-CM | POA: Diagnosis not present

## 2019-10-11 DIAGNOSIS — D485 Neoplasm of uncertain behavior of skin: Secondary | ICD-10-CM | POA: Diagnosis not present

## 2019-10-15 ENCOUNTER — Telehealth: Payer: Self-pay | Admitting: Gastroenterology

## 2019-10-15 DIAGNOSIS — J0100 Acute maxillary sinusitis, unspecified: Secondary | ICD-10-CM | POA: Diagnosis not present

## 2019-10-15 NOTE — Telephone Encounter (Signed)
Pt reported that her PCP Dr. Kenton Kingfisher had prescribed amoxicillin and prednisone.  Pt inquired whether it is OK to take those drugs with Nexium.

## 2019-10-15 NOTE — Telephone Encounter (Signed)
Please advise 

## 2019-10-17 NOTE — Telephone Encounter (Signed)
Should be okay to take these medications with Nexium RG

## 2019-10-18 NOTE — Telephone Encounter (Signed)
Patient notified

## 2019-11-03 DIAGNOSIS — R040 Epistaxis: Secondary | ICD-10-CM | POA: Insufficient documentation

## 2019-11-04 ENCOUNTER — Encounter: Payer: Self-pay | Admitting: Women's Health

## 2019-11-04 DIAGNOSIS — Z1231 Encounter for screening mammogram for malignant neoplasm of breast: Secondary | ICD-10-CM | POA: Diagnosis not present

## 2019-11-25 DIAGNOSIS — N182 Chronic kidney disease, stage 2 (mild): Secondary | ICD-10-CM | POA: Diagnosis not present

## 2019-11-25 DIAGNOSIS — E7800 Pure hypercholesterolemia, unspecified: Secondary | ICD-10-CM | POA: Diagnosis not present

## 2019-11-25 DIAGNOSIS — E119 Type 2 diabetes mellitus without complications: Secondary | ICD-10-CM | POA: Diagnosis not present

## 2019-11-25 DIAGNOSIS — I10 Essential (primary) hypertension: Secondary | ICD-10-CM | POA: Diagnosis not present

## 2019-11-25 DIAGNOSIS — E039 Hypothyroidism, unspecified: Secondary | ICD-10-CM | POA: Diagnosis not present

## 2019-12-09 DIAGNOSIS — K219 Gastro-esophageal reflux disease without esophagitis: Secondary | ICD-10-CM | POA: Diagnosis not present

## 2019-12-09 DIAGNOSIS — Z0000 Encounter for general adult medical examination without abnormal findings: Secondary | ICD-10-CM | POA: Diagnosis not present

## 2019-12-09 DIAGNOSIS — G43909 Migraine, unspecified, not intractable, without status migrainosus: Secondary | ICD-10-CM | POA: Diagnosis not present

## 2019-12-09 DIAGNOSIS — E7800 Pure hypercholesterolemia, unspecified: Secondary | ICD-10-CM | POA: Diagnosis not present

## 2019-12-09 DIAGNOSIS — E039 Hypothyroidism, unspecified: Secondary | ICD-10-CM | POA: Diagnosis not present

## 2020-01-07 DIAGNOSIS — E039 Hypothyroidism, unspecified: Secondary | ICD-10-CM | POA: Diagnosis not present

## 2020-01-07 DIAGNOSIS — E119 Type 2 diabetes mellitus without complications: Secondary | ICD-10-CM | POA: Diagnosis not present

## 2020-01-07 DIAGNOSIS — I10 Essential (primary) hypertension: Secondary | ICD-10-CM | POA: Diagnosis not present

## 2020-01-07 DIAGNOSIS — E7800 Pure hypercholesterolemia, unspecified: Secondary | ICD-10-CM | POA: Diagnosis not present

## 2020-01-07 DIAGNOSIS — N182 Chronic kidney disease, stage 2 (mild): Secondary | ICD-10-CM | POA: Diagnosis not present

## 2020-01-13 DIAGNOSIS — H5203 Hypermetropia, bilateral: Secondary | ICD-10-CM | POA: Diagnosis not present

## 2020-01-13 DIAGNOSIS — H11001 Unspecified pterygium of right eye: Secondary | ICD-10-CM | POA: Diagnosis not present

## 2020-01-13 DIAGNOSIS — H52203 Unspecified astigmatism, bilateral: Secondary | ICD-10-CM | POA: Diagnosis not present

## 2020-01-20 DIAGNOSIS — Z1152 Encounter for screening for COVID-19: Secondary | ICD-10-CM | POA: Diagnosis not present

## 2020-01-20 DIAGNOSIS — Z20822 Contact with and (suspected) exposure to covid-19: Secondary | ICD-10-CM | POA: Diagnosis not present

## 2020-02-03 DIAGNOSIS — L821 Other seborrheic keratosis: Secondary | ICD-10-CM | POA: Diagnosis not present

## 2020-02-03 DIAGNOSIS — D225 Melanocytic nevi of trunk: Secondary | ICD-10-CM | POA: Diagnosis not present

## 2020-02-03 DIAGNOSIS — L718 Other rosacea: Secondary | ICD-10-CM | POA: Diagnosis not present

## 2020-02-03 DIAGNOSIS — L814 Other melanin hyperpigmentation: Secondary | ICD-10-CM | POA: Diagnosis not present

## 2020-02-03 DIAGNOSIS — L570 Actinic keratosis: Secondary | ICD-10-CM | POA: Diagnosis not present

## 2020-02-03 DIAGNOSIS — D1801 Hemangioma of skin and subcutaneous tissue: Secondary | ICD-10-CM | POA: Diagnosis not present

## 2020-02-09 ENCOUNTER — Ambulatory Visit (INDEPENDENT_AMBULATORY_CARE_PROVIDER_SITE_OTHER): Payer: Medicare Other | Admitting: Nurse Practitioner

## 2020-02-09 ENCOUNTER — Other Ambulatory Visit: Payer: Self-pay

## 2020-02-09 ENCOUNTER — Encounter: Payer: Self-pay | Admitting: Nurse Practitioner

## 2020-02-09 VITALS — BP 124/78

## 2020-02-09 DIAGNOSIS — N898 Other specified noninflammatory disorders of vagina: Secondary | ICD-10-CM

## 2020-02-09 DIAGNOSIS — R350 Frequency of micturition: Secondary | ICD-10-CM

## 2020-02-09 DIAGNOSIS — N3001 Acute cystitis with hematuria: Secondary | ICD-10-CM | POA: Diagnosis not present

## 2020-02-09 LAB — WET PREP FOR TRICH, YEAST, CLUE

## 2020-02-09 MED ORDER — FLUCONAZOLE 150 MG PO TABS: 150.0000 mg | ORAL_TABLET | Freq: Once | ORAL | 0 refills | Status: AC

## 2020-02-09 MED ORDER — NITROFURANTOIN MONOHYD MACRO 100 MG PO CAPS: 100.0000 mg | ORAL_CAPSULE | Freq: Two times a day (BID) | ORAL | 0 refills | Status: DC

## 2020-02-09 NOTE — Progress Notes (Signed)
   Acute Office Visit  Subjective:    Patient ID: Sandra Ellis, female    DOB: 1946-05-23, 74 y.o.   MRN: [id number]  Chief Complaint  Patient presents with  . Urinary Frequency    jk  . Vaginal Itching    HPI Presents today for urinary frequency and pressure that began yesterday. Also complains of vaginal itching that she has had for a few weeks. Her daughter just got a pool and she spent a lot of time there in a wet bathing suit this past weekend.    Review of Systems  Constitutional: Negative.   Gastrointestinal: Negative.   Genitourinary: Positive for frequency. Negative for dysuria, flank pain, hematuria, pelvic pain, urgency and vaginal discharge.       Vaginal itching       Objective:    Physical Exam Constitutional:      Appearance: Normal appearance.  Abdominal:     General: Abdomen is flat.     Palpations: Abdomen is soft.     Tenderness: There is no right CVA tenderness or left CVA tenderness.  Genitourinary:    General: Normal vulva.     Vagina: No vaginal discharge or erythema.     Comments: Vaginal atrophy, dryness    BP 124/78 (BP Location: Right Arm, Patient Position: Sitting, Cuff Size: Normal)  Wt Readings from Last 3 Encounters:  09/27/19 136 lb (61.7 kg)  05/06/19 141 lb 8 oz (64.2 kg)  01/05/19 137 lb (62.1 kg)   Wet prep negative UA: 3+ blood, 1+ leukocytes, wbc 40-60, rbc 40-60, many bacteria     Assessment & Plan:   Problem List Items Addressed This Visit    None    Visit Diagnoses    Urinary frequency    -  Primary   Relevant Orders   Urinalysis,Complete w/RFL Culture   Vaginal itching       Relevant Medications   fluconazole (DIFLUCAN) 150 MG tablet   Other Relevant Orders   WET PREP FOR TRICH, YEAST, CLUE   Acute cystitis with hematuria       Relevant Medications   nitrofurantoin, macrocrystal-monohydrate, (MACROBID) 100 MG capsule      Plan: Macrobid 100 mg twice a day for 5 days, urine culture pending.  One-time  dose of Diflucan to avoid yeast from antibiotic use. Irritation most likely from vaginal atrophy and dryness. Recommended options and she will try OTC lubricants. Educated on good vaginal hygiene and avoiding wet clothing. Follow up if symptoms worsen or do not improve.     Tamela Gammon [facility], 12:19 PM 02/09/2020

## 2020-02-09 NOTE — Patient Instructions (Addendum)
Replenz over the counter for vaginal dryness  Urinary Tract Infection, Adult A urinary tract infection (UTI) is an infection of any part of the urinary tract. The urinary tract includes:  The kidneys.  The ureters.  The bladder.  The urethra. These organs make, store, and get rid of pee (urine) in the body. What are the causes? This is caused by germs (bacteria) in your genital area. These germs grow and cause swelling (inflammation) of your urinary tract. What increases the risk? You are more likely to develop this condition if:  You have a small, thin tube (catheter) to drain pee.  You cannot control when you pee or poop (incontinence).  You are female, and: ? You use these methods to prevent pregnancy:  A medicine that kills sperm (spermicide).  A device that blocks sperm (diaphragm). ? You have low levels of a female hormone (estrogen). ? You are pregnant.  You have genes that add to your risk.  You are sexually active.  You take antibiotic medicines.  You have trouble peeing because of: ? A prostate that is bigger than normal, if you are female. ? A blockage in the part of your body that drains pee from the bladder (urethra). ? A kidney stone. ? A nerve condition that affects your bladder (neurogenic bladder). ? Not getting enough to drink. ? Not peeing often enough.  You have other conditions, such as: ? Diabetes. ? A weak disease-fighting system (immune system). ? Sickle cell disease. ? Gout. ? Injury of the spine. What are the signs or symptoms? Symptoms of this condition include:  Needing to pee right away (urgently).  Peeing often.  Peeing small amounts often.  Pain or burning when peeing.  Blood in the pee.  Pee that smells bad or not like normal.  Trouble peeing.  Pee that is cloudy.  Fluid coming from the vagina, if you are female.  Pain in the belly or lower back. Other symptoms include:  Throwing up (vomiting).  No urge to  eat.  Feeling mixed up (confused).  Being tired and grouchy (irritable).  A fever.  Watery poop (diarrhea). How is this treated? This condition may be treated with:  Antibiotic medicine.  Other medicines.  Drinking enough water. Follow these instructions at home:  Medicines  Take over-the-counter and prescription medicines only as told by your doctor.  If you were prescribed an antibiotic medicine, take it as told by your doctor. Do not stop taking it even if you start to feel better. General instructions  Make sure you: ? Pee until your bladder is empty. ? Do not hold pee for a long time. ? Empty your bladder after sex. ? Wipe from front to back after pooping if you are a female. Use each tissue one time when you wipe.  Drink enough fluid to keep your pee pale yellow.  Keep all follow-up visits as told by your doctor. This is important. Contact a doctor if:  You do not get better after 1-2 days.  Your symptoms go away and then come back. Get help right away if:  You have very bad back pain.  You have very bad pain in your lower belly.  You have a fever.  You are sick to your stomach (nauseous).  You are throwing up. Summary  A urinary tract infection (UTI) is an infection of any part of the urinary tract.  This condition is caused by germs in your genital area.  There are many risk factors for  a UTI. These include having a small, thin tube to drain pee and not being able to control when you pee or poop.  Treatment includes antibiotic medicines for germs.  Drink enough fluid to keep your pee pale yellow. This information is not intended to replace advice given to you by your health care provider. Make sure you discuss any questions you have with your health care provider. Document Revised: 08/06/2018 Document Reviewed: 02/26/2018 Elsevier Patient Education  2020 [employer].

## 2020-02-10 ENCOUNTER — Other Ambulatory Visit: Payer: Self-pay | Admitting: Nurse Practitioner

## 2020-02-10 ENCOUNTER — Telehealth: Payer: Self-pay | Admitting: *Deleted

## 2020-02-10 ENCOUNTER — Encounter: Payer: Self-pay | Admitting: Nurse Practitioner

## 2020-02-10 MED ORDER — SULFAMETHOXAZOLE-TRIMETHOPRIM 800-160 MG PO TABS: 1.0000 | ORAL_TABLET | Freq: Two times a day (BID) | ORAL | 0 refills | Status: DC

## 2020-02-10 NOTE — Telephone Encounter (Signed)
Please send Bactrim DS [phone] BID for 6 days to requested pharmacy. I have added Macrobid to her list of allergies.

## 2020-02-10 NOTE — Telephone Encounter (Signed)
Patient called was treated at office yesterday for UTI prescribed Macrobid 100 mg tablets 1 po bid x 5 days, took 2 doses of medication and noticed rash, body aches and difficulty breathing. She has stopped medication asked if another Rx could be sent to pharmacy? Patient currently traveling out of start new pharmacy placed for Rx. Please advise

## 2020-02-10 NOTE — Telephone Encounter (Signed)
Patient informed, Rx sent.  

## 2020-02-11 LAB — URINALYSIS, COMPLETE W/RFL CULTURE
Bilirubin Urine: NEGATIVE
Glucose, UA: NEGATIVE
Hyaline Cast: NONE SEEN /LPF
Ketones, ur: NEGATIVE
Nitrites, Initial: NEGATIVE
Protein, ur: NEGATIVE
Specific Gravity, Urine: 1.01 (ref 1.001–1.03)
pH: 6 (ref 5.0–8.0)

## 2020-02-11 LAB — URINE CULTURE
MICRO NUMBER:: 10571123
SPECIMEN QUALITY:: ADEQUATE

## 2020-02-11 LAB — CULTURE INDICATED

## 2020-02-15 DIAGNOSIS — N390 Urinary tract infection, site not specified: Secondary | ICD-10-CM | POA: Diagnosis not present

## 2020-02-17 ENCOUNTER — Ambulatory Visit: Payer: Medicare Other | Admitting: Podiatry

## 2020-03-20 DIAGNOSIS — H43811 Vitreous degeneration, right eye: Secondary | ICD-10-CM | POA: Diagnosis not present

## 2020-03-21 DIAGNOSIS — E039 Hypothyroidism, unspecified: Secondary | ICD-10-CM | POA: Diagnosis not present

## 2020-03-21 DIAGNOSIS — J4521 Mild intermittent asthma with (acute) exacerbation: Secondary | ICD-10-CM | POA: Diagnosis not present

## 2020-03-21 DIAGNOSIS — N182 Chronic kidney disease, stage 2 (mild): Secondary | ICD-10-CM | POA: Diagnosis not present

## 2020-03-21 DIAGNOSIS — I10 Essential (primary) hypertension: Secondary | ICD-10-CM | POA: Diagnosis not present

## 2020-03-21 DIAGNOSIS — E7800 Pure hypercholesterolemia, unspecified: Secondary | ICD-10-CM | POA: Diagnosis not present

## 2020-03-21 DIAGNOSIS — E119 Type 2 diabetes mellitus without complications: Secondary | ICD-10-CM | POA: Diagnosis not present

## 2020-04-28 DIAGNOSIS — E039 Hypothyroidism, unspecified: Secondary | ICD-10-CM | POA: Diagnosis not present

## 2020-04-28 DIAGNOSIS — I10 Essential (primary) hypertension: Secondary | ICD-10-CM | POA: Diagnosis not present

## 2020-04-28 DIAGNOSIS — E7800 Pure hypercholesterolemia, unspecified: Secondary | ICD-10-CM | POA: Diagnosis not present

## 2020-04-28 DIAGNOSIS — N182 Chronic kidney disease, stage 2 (mild): Secondary | ICD-10-CM | POA: Diagnosis not present

## 2020-04-28 DIAGNOSIS — E119 Type 2 diabetes mellitus without complications: Secondary | ICD-10-CM | POA: Diagnosis not present

## 2020-04-28 DIAGNOSIS — J4521 Mild intermittent asthma with (acute) exacerbation: Secondary | ICD-10-CM | POA: Diagnosis not present

## 2020-05-11 ENCOUNTER — Encounter: Payer: Medicare Other | Admitting: Nurse Practitioner

## 2020-05-25 ENCOUNTER — Other Ambulatory Visit: Payer: Self-pay

## 2020-05-25 ENCOUNTER — Ambulatory Visit (INDEPENDENT_AMBULATORY_CARE_PROVIDER_SITE_OTHER): Payer: Medicare Other | Admitting: Nurse Practitioner

## 2020-05-25 ENCOUNTER — Encounter: Payer: Self-pay | Admitting: Nurse Practitioner

## 2020-05-25 VITALS — BP 126/80 | Ht 67.0 in | Wt 140.0 lb

## 2020-05-25 DIAGNOSIS — Z90710 Acquired absence of both cervix and uterus: Secondary | ICD-10-CM | POA: Insufficient documentation

## 2020-05-25 DIAGNOSIS — Z01419 Encounter for gynecological examination (general) (routine) without abnormal findings: Secondary | ICD-10-CM | POA: Diagnosis not present

## 2020-05-25 DIAGNOSIS — M8589 Other specified disorders of bone density and structure, multiple sites: Secondary | ICD-10-CM | POA: Insufficient documentation

## 2020-05-25 NOTE — Progress Notes (Signed)
   Sandra Ellis 03-14-1946 [id number]   History:  74 y.o. G3P3 presents for breast and pelvic exam without GYN complaints. 1995 TAH for DUB, no HRT. Normal pap and mammogram history. History of osteopenia, declines repeat Dexa and treatments.   Gynecologic History No LMP recorded. Patient has had a hysterectomy.   Last Pap: No longer screening per guidelines Last mammogram: 11/04/2019. Results were: normal Last colonoscopy: 07/10/2018. Results were: polyp Last Dexa: 2001. Results were: osteopenia  Past medical history, past surgical history, family history and social history were all reviewed and documented in the EPIC chart.  ROS:  A ROS was performed and pertinent positives and negatives are included.  Exam:  Vitals:   05/25/20 1107  BP: 126/80  Weight: 140 lb (63.5 kg)  Height: 5\' 7"  (1.702 m)   Body mass index is 21.93 kg/m.  General appearance:  Normal Thyroid:  Symmetrical, normal in size, without palpable masses or nodularity. Respiratory  Auscultation:  Clear without wheezing or rhonchi Cardiovascular  Auscultation:  Regular rate, without rubs, murmurs or gallops  Edema/varicosities:  Not grossly evident Abdominal  Soft,nontender, without masses, guarding or rebound.  Liver/spleen:  No organomegaly noted  Hernia:  None appreciated  Skin  Inspection:  Grossly normal   Breasts: Examined lying and sitting.   Right: Without masses, retractions, discharge or axillary adenopathy.   Left: Without masses, retractions, discharge or axillary adenopathy. Gentitourinary   Inguinal/mons:  Normal without inguinal adenopathy  External genitalia:  Normal  BUS/Urethra/Skene's glands:  Normal  Vagina:  Atrophy  Cervix:  Absent  Uterus:  Absent  Adnexa/parametria:     Rt: Without masses or tenderness.   Lt: Without masses or tenderness.  Anus and perineum: Normal  Digital rectal exam: Declines  Assessment/Plan:  74 y.o. G3P3 for breast and pelvic exam.   Well female  exam with routine gynecological exam - Education provided on SBEs, importance of preventative screenings, current guidelines, high calcium diet, regular exercise, and multivitamin daily.  Labs done elsewhere.  History of total abdominal hysterectomy -1995 for DUB, no HRT.  Osteopenia of multiple sites -DEXA in 2001 showed osteopenia.  Patient declines treatment or further screenings. Taking daily vitamin D supplement and exercises regularly with swimming 4 times a week and weightlifting 3 days a week.  Follow-up in 1 year for annual.      Tamela Gammon [facility], 11:12 AM 05/25/2020

## 2020-05-25 NOTE — Patient Instructions (Signed)
Health Maintenance After Age 72 After age 72, you are at a higher risk for certain long-term diseases and infections as well as injuries from falls. Falls are a major cause of broken bones and head injuries in people who are older than age 72. Getting regular preventive care can help to keep you healthy and well. Preventive care includes getting regular testing and making lifestyle changes as recommended by your health care provider. Talk with your health care provider about:  Which screenings and tests you should have. A screening is a test that checks for a disease when you have no symptoms.  A diet and exercise plan that is right for you. What should I know about screenings and tests to prevent falls? Screening and testing are the best ways to find a health problem early. Early diagnosis and treatment give you the best chance of managing medical conditions that are common after age 72. Certain conditions and lifestyle choices may make you more likely to have a fall. Your health care provider may recommend:  Regular vision checks. Poor vision and conditions such as cataracts can make you more likely to have a fall. If you wear glasses, make sure to get your prescription updated if your vision changes.  Medicine review. Work with your health care provider to regularly review all of the medicines you are taking, including over-the-counter medicines. Ask your health care provider about any side effects that may make you more likely to have a fall. Tell your health care provider if any medicines that you take make you feel dizzy or sleepy.  Osteoporosis screening. Osteoporosis is a condition that causes the bones to get weaker. This can make the bones weak and cause them to break more easily.  Blood pressure screening. Blood pressure changes and medicines to control blood pressure can make you feel dizzy.  Strength and balance checks. Your health care provider may recommend certain tests to check your  strength and balance while standing, walking, or changing positions.  Foot health exam. Foot pain and numbness, as well as not wearing proper footwear, can make you more likely to have a fall.  Depression screening. You may be more likely to have a fall if you have a fear of falling, feel emotionally low, or feel unable to do activities that you used to do.  Alcohol use screening. Using too much alcohol can affect your balance and may make you more likely to have a fall. What actions can I take to lower my risk of falls? General instructions  Talk with your health care provider about your risks for falling. Tell your health care provider if: ? You fall. Be sure to tell your health care provider about all falls, even ones that seem minor. ? You feel dizzy, sleepy, or off-balance.  Take over-the-counter and prescription medicines only as told by your health care provider. These include any supplements.  Eat a healthy diet and maintain a healthy weight. A healthy diet includes low-fat dairy products, low-fat (lean) meats, and fiber from whole grains, beans, and lots of fruits and vegetables. Home safety  Remove any tripping hazards, such as rugs, cords, and clutter.  Install safety equipment such as grab bars in bathrooms and safety rails on stairs.  Keep rooms and walkways well-lit. Activity   Follow a regular exercise program to stay fit. This will help you maintain your balance. Ask your health care provider what types of exercise are appropriate for you.  If you need a cane or   walker, use it as recommended by your health care provider.  Wear supportive shoes that have nonskid soles. Lifestyle  Do not drink alcohol if your health care provider tells you not to drink.  If you drink alcohol, limit how much you have: ? 0-1 drink a day for women. ? 0-2 drinks a day for men.  Be aware of how much alcohol is in your drink. In the [place] one drink equals one typical bottle of beer (12  oz), one-half glass of wine (5 oz), or one shot of hard liquor (1 oz).  Do not use any products that contain nicotine or tobacco, such as cigarettes and e-cigarettes. If you need help quitting, ask your health care provider. Summary  Having a healthy lifestyle and getting preventive care can help to protect your health and wellness after age 72.  Screening and testing are the best way to find a health problem early and help you avoid having a fall. Early diagnosis and treatment give you the best chance for managing medical conditions that are more common for people who are older than age 72.  Falls are a major cause of broken bones and head injuries in people who are older than age 72. Take precautions to prevent a fall at home.  Work with your health care provider to learn what changes you can make to improve your health and wellness and to prevent falls. This information is not intended to replace advice given to you by your health care provider. Make sure you discuss any questions you have with your health care provider. Document Revised: 12/10/2018 Document Reviewed: 07/02/2017 Elsevier Patient Education  2020 [employer]  

## 2020-05-30 ENCOUNTER — Ambulatory Visit (INDEPENDENT_AMBULATORY_CARE_PROVIDER_SITE_OTHER): Payer: Medicare Other | Admitting: Podiatry

## 2020-05-30 ENCOUNTER — Other Ambulatory Visit: Payer: Self-pay

## 2020-05-30 ENCOUNTER — Encounter: Payer: Self-pay | Admitting: Podiatry

## 2020-05-30 DIAGNOSIS — L603 Nail dystrophy: Secondary | ICD-10-CM | POA: Diagnosis not present

## 2020-05-30 DIAGNOSIS — B351 Tinea unguium: Secondary | ICD-10-CM | POA: Diagnosis not present

## 2020-05-30 NOTE — Progress Notes (Signed)
Subjective:  Patient ID: Sandra Ellis, female    DOB: [birth year],  MRN: [id number] HPI Chief Complaint  Patient presents with  . Nail Problem    Toenails - thick and discolored in areas x 1 year, tried OTC meds, would prefer not to take anything orally for treatment  . New Patient (Initial Visit)    74 y.o. female presents with the above complaint.   ROS: Denies fever chills nausea vomiting muscle aches pains calf pain back pain chest pain shortness of breath.  Past Medical History:  Diagnosis Date  . COVID-19 virus detected 09/07/2018  . Elevated cholesterol   . H/O blood clots 1990  . Hx of colonic polyp   . Hypothyroidism   . Pulmonary emboli [facility])    Past Surgical History:  Procedure Laterality Date  . COLONOSCOPY  11/09/2014   Colonic polyp status polypectomy. Small internal hemorrhoids. Tubular adenoma.  Marland Kitchen KNEE SURGERY     cyst removed  . POLYPECTOMY    . TONSILLECTOMY    . TOTAL HIP ARTHROPLASTY Left 12/31/2016   Procedure: LEFT TOTAL HIP ARTHROPLASTY ANTERIOR APPROACH;  Surgeon: Paralee Cancel, MD;  Location: [facility];  Service: Orthopedics;  Laterality: Left;  Marland Kitchen VAGINAL HYSTERECTOMY     TVH    Current Outpatient Medications:  .  esomeprazole (NEXIUM) 40 MG capsule, Take 1 capsule by mouth daily., Disp: , Rfl:  .  fluorouracil (EFUDEX) 5 % cream, 1 APPLICATION APPLY ON THE SKIN AT BEDTIME APPLY TO AFFECTED AREA NIGHTLY FOR 2 WEEKS, Disp: , Rfl:  .  albuterol (VENTOLIN HFA) 108 (90 Base) MCG/ACT inhaler, Inhale into the lungs., Disp: , Rfl:  .  amoxicillin (AMOXIL) 500 MG capsule, Take 500 mg by mouth 3 (three) times daily., Disp: , Rfl:  .  Ascorbic Acid (VITAMIN C PO), Take 1 tablet by mouth daily., Disp: , Rfl:  .  Cholecalciferol (VITAMIN D3 PO), Take 2 tablets by mouth daily., Disp: , Rfl:  .  Coenzyme Q10 (CO Q-10) 300 MG CAPS, Take 300 mg by mouth daily., Disp: , Rfl:  .  levothyroxine (SYNTHROID, LEVOTHROID) 75 MCG tablet, Take 75 mcg by mouth daily before  breakfast. , Disp: , Rfl:  .  Magnesium 250 MG TABS, Take 250 mg by mouth daily. , Disp: , Rfl:  .  Multiple Vitamins-Minerals (HAIR SKIN AND NAILS FORMULA PO), Take 1 tablet by mouth daily., Disp: , Rfl:  .  Multiple Vitamins-Minerals (ZINC PO), Take 1 tablet by mouth daily., Disp: , Rfl:  .  Omega-3 Fatty Acids (FISH OIL) 1200 MG CAPS, Take 2 capsules by mouth daily. , Disp: , Rfl:  .  Probiotic Product (PROBIOTIC PO), Take 1 tablet by mouth daily as needed. , Disp: , Rfl:  .  rosuvastatin (CRESTOR) 5 MG tablet, Take 5 mg by mouth every other day. , Disp: , Rfl:  .  tretinoin (RETIN-A) 0.1 % cream, Apply 1 application topically at bedtime. APPLIED TO FACE, Disp: , Rfl:   Allergies  Allergen Reactions  . Nitrofuran Derivatives Shortness Of Breath and Rash  . Levaquin [Levofloxacin In D5w] Other (See Comments)    NUMBNESS/REDNESS  . Ciprofloxacin Rash and Other (See Comments)    NUMBNESS/REDNESS/RASH   Review of Systems Objective:  There were no vitals filed for this visit.  General: Well developed, nourished, in no acute distress, alert and oriented x3   Dermatological: Skin is warm, dry and supple bilateral. Nails x 10 are well maintained; hallux nails and some of the lesser  nails do demonstrate a distal onycholysis there does not appear to be subungual debris.  Remaining integument appears unremarkable at this time. There are no open sores, no preulcerative lesions, no rash or signs of infection present.  Vascular: Dorsalis Pedis artery and Posterior Tibial artery pedal pulses are 2/4 bilateral with immedate capillary fill time. Pedal hair growth present. No varicosities and no lower extremity edema present bilateral.   Neruologic: Grossly intact via light touch bilateral. Vibratory intact via tuning fork bilateral. Protective threshold with Semmes Wienstein monofilament intact to all pedal sites bilateral. Patellar and Achilles deep tendon reflexes 2+ bilateral. No Babinski or clonus  noted bilateral.   Musculoskeletal: No gross boney pedal deformities bilateral. No pain, crepitus, or limitation noted with foot and ankle range of motion bilateral. Muscular strength 5/5 in all groups tested bilateral.  Gait: Unassisted, Nonantalgic.    Radiographs:  None taken  Assessment & Plan:   Assessment: Nail dystrophy cannot rule out onychomycosis.  Plan: Discussed etiology pathology conservative surgical therapies at this point took samples of the skin and nail to be sent for pathologic evaluation I will follow-up with her once those come in.  I will follow-up with her in about a month.     Eland Lamantia T. [place], [place]

## 2020-06-29 ENCOUNTER — Ambulatory Visit: Payer: Medicare Other | Admitting: Podiatry

## 2020-07-06 DIAGNOSIS — I10 Essential (primary) hypertension: Secondary | ICD-10-CM | POA: Diagnosis not present

## 2020-07-06 DIAGNOSIS — E119 Type 2 diabetes mellitus without complications: Secondary | ICD-10-CM | POA: Diagnosis not present

## 2020-07-06 DIAGNOSIS — E039 Hypothyroidism, unspecified: Secondary | ICD-10-CM | POA: Diagnosis not present

## 2020-07-06 DIAGNOSIS — E7800 Pure hypercholesterolemia, unspecified: Secondary | ICD-10-CM | POA: Diagnosis not present

## 2020-07-11 ENCOUNTER — Ambulatory Visit (INDEPENDENT_AMBULATORY_CARE_PROVIDER_SITE_OTHER): Payer: Medicare Other | Admitting: Podiatry

## 2020-07-11 ENCOUNTER — Other Ambulatory Visit: Payer: Self-pay

## 2020-07-11 ENCOUNTER — Encounter: Payer: Self-pay | Admitting: Podiatry

## 2020-07-11 DIAGNOSIS — B351 Tinea unguium: Secondary | ICD-10-CM

## 2020-07-11 MED ORDER — EFINACONAZOLE 10 % EX SOLN: 1.0000 [drp] | Freq: Every day | CUTANEOUS | 2 refills | Status: DC

## 2020-07-11 NOTE — Progress Notes (Signed)
  Subjective:  Patient ID: Sandra Ellis, female    DOB: 06-12-1946,  MRN: [id number]  Chief Complaint  Patient presents with  . Nail Problem    PT stated that she is doing well she still has some concerns about the left hallux nail     74 y.o. female presents with the above complaint. History confirmed with patient. Here for review of pathology results  Objective:  Physical Exam: warm, good capillary refill, no trophic changes or ulcerative lesions, normal DP and PT pulses and normal sensory exam. Yellow discoloration of hallux nails  Assessment:   1. Onychomycosis      Plan:  Patient was evaluated and treated and all questions answered.   Discussed the etiology and treatment options for the condition in detail with the patient. Educated patient on the topical and oral treatment options for mycotic nails. She prefers to avoid an oral medication at this time. Rx for Jublia sent to her pharmacy.   Return in about 3 months (around 10/11/2020).

## 2020-07-12 ENCOUNTER — Telehealth: Payer: Self-pay | Admitting: Podiatry

## 2020-07-12 DIAGNOSIS — D692 Other nonthrombocytopenic purpura: Secondary | ICD-10-CM | POA: Diagnosis not present

## 2020-07-12 MED ORDER — CICLOPIROX 8 % EX SOLN: Freq: Every day | CUTANEOUS | 2 refills | Status: DC

## 2020-07-12 NOTE — Telephone Encounter (Signed)
Patient called in stating she need a more affordable prescription called in, stating you informed her that Efinaconazole    may be expensive and if so she was to reach out to get another prescription called in, please advise

## 2020-07-12 NOTE — Addendum Note (Signed)
Addended bySherryle Lis, Roux Brandy R on: 07/12/2020 09:03 AM   Modules accepted: Orders

## 2020-07-12 NOTE — Telephone Encounter (Signed)
Rx sent for Penlac

## 2020-08-01 ENCOUNTER — Telehealth: Payer: Self-pay | Admitting: *Deleted

## 2020-08-01 NOTE — Telephone Encounter (Signed)
Patient called c/o UTI symptoms, requesting Rx via phone. I explained office visit is recommended if infection. Patient said she will call back and see if her daughter can bring her.

## 2020-08-02 DIAGNOSIS — N390 Urinary tract infection, site not specified: Secondary | ICD-10-CM | POA: Diagnosis not present

## 2020-08-02 DIAGNOSIS — R319 Hematuria, unspecified: Secondary | ICD-10-CM | POA: Diagnosis not present

## 2020-08-03 ENCOUNTER — Telehealth: Payer: Self-pay | Admitting: Gastroenterology

## 2020-08-03 NOTE — Telephone Encounter (Signed)
That is really good Thanks for letting me know  RG

## 2020-08-03 NOTE — Telephone Encounter (Signed)
Patient wants to let know you know that she's taking Apple Cider Vinegar capsules and they seem to be taking care of all her symptoms.

## 2020-08-09 ENCOUNTER — Ambulatory Visit: Payer: Medicare Other | Admitting: Gastroenterology

## 2020-09-05 ENCOUNTER — Other Ambulatory Visit: Payer: Self-pay | Admitting: Family Medicine

## 2020-09-06 ENCOUNTER — Other Ambulatory Visit: Payer: Self-pay | Admitting: Family Medicine

## 2020-09-06 DIAGNOSIS — R109 Unspecified abdominal pain: Secondary | ICD-10-CM

## 2020-09-21 DIAGNOSIS — G43909 Migraine, unspecified, not intractable, without status migrainosus: Secondary | ICD-10-CM | POA: Diagnosis not present

## 2020-09-21 DIAGNOSIS — Z0000 Encounter for general adult medical examination without abnormal findings: Secondary | ICD-10-CM | POA: Diagnosis not present

## 2020-09-21 DIAGNOSIS — E7800 Pure hypercholesterolemia, unspecified: Secondary | ICD-10-CM | POA: Diagnosis not present

## 2020-09-21 DIAGNOSIS — E559 Vitamin D deficiency, unspecified: Secondary | ICD-10-CM | POA: Diagnosis not present

## 2020-09-21 DIAGNOSIS — E039 Hypothyroidism, unspecified: Secondary | ICD-10-CM | POA: Diagnosis not present

## 2020-10-17 ENCOUNTER — Encounter: Payer: Self-pay | Admitting: Podiatry

## 2020-10-17 ENCOUNTER — Other Ambulatory Visit: Payer: Self-pay

## 2020-10-17 ENCOUNTER — Ambulatory Visit (INDEPENDENT_AMBULATORY_CARE_PROVIDER_SITE_OTHER): Payer: Medicare Other | Admitting: Podiatry

## 2020-10-17 DIAGNOSIS — B351 Tinea unguium: Secondary | ICD-10-CM

## 2020-10-17 MED ORDER — CICLOPIROX 8 % EX SOLN: Freq: Every day | CUTANEOUS | 2 refills | Status: DC

## 2020-10-17 NOTE — Progress Notes (Signed)
  Subjective:  Patient ID: Sandra Ellis, female    DOB: May 12, 1946,  MRN: [id number]  Chief Complaint  Patient presents with  . Nail Problem    PT is concerned about the red line that appeared in her nail    75 y.o. female returns with the above complaint. History confirmed with patient.  She has been using the Penlac.  Notes some improvement.  There is redness in the proximal left hallux nail  Objective:  Physical Exam: warm, good capillary refill, no trophic changes or ulcerative lesions, normal DP and PT pulses and normal sensory exam. Yellow discoloration of hallux nails, she now has redness proximally     Assessment:   No diagnosis found.   Plan:  Patient was evaluated and treated and all questions answered.  Continue Penlac therapy.  Refill sent.  If not improving in the next 3 to 4 months we will consider addition of laser therapy, and she will call to schedule this  I think likely the color changes still related to onychomycosis.  No history of trauma.  Nail plate well attached.  Do not see reason to avulsed nail today.   Return in about 6 months (around 04/16/2021).

## 2020-10-26 ENCOUNTER — Other Ambulatory Visit: Payer: Self-pay

## 2020-10-26 ENCOUNTER — Encounter: Payer: Self-pay | Admitting: Podiatry

## 2020-10-26 ENCOUNTER — Ambulatory Visit (INDEPENDENT_AMBULATORY_CARE_PROVIDER_SITE_OTHER): Payer: Medicare Other | Admitting: Podiatry

## 2020-10-26 DIAGNOSIS — L03031 Cellulitis of right toe: Secondary | ICD-10-CM | POA: Diagnosis not present

## 2020-10-26 MED ORDER — MUPIROCIN 2 % EX OINT: 1.0000 "application " | TOPICAL_OINTMENT | Freq: Two times a day (BID) | CUTANEOUS | 0 refills | Status: DC

## 2020-10-26 NOTE — Progress Notes (Signed)
She presents today for follow-up of her fourth toe.  She states that it still continues to swell and get red on occasion.  States is doing better recently.  Objective: Vital signs stable alert oriented x3.  She does have some redness at the proximal nail fold with what appears to be a nail infection extending to the level of the nail fold.  It appears to be improving there is no purulence and there is no malodor.  Assessment: Mild paronychia fourth digit right foot.  Plan: Continue soak Epson salts and warm water on a daily basis apply Bactroban ointment and a Band-Aid.  I like to follow-up with her in a couple weeks to make sure she is doing better.  We did discuss the possible need for nail avulsion.  She understands and is amenable to it if necessary.

## 2020-12-14 ENCOUNTER — Encounter (HOSPITAL_COMMUNITY): Payer: Self-pay | Admitting: Emergency Medicine

## 2020-12-14 ENCOUNTER — Other Ambulatory Visit: Payer: Self-pay

## 2020-12-14 ENCOUNTER — Emergency Department (HOSPITAL_COMMUNITY): Payer: Medicare Other

## 2020-12-14 ENCOUNTER — Emergency Department (HOSPITAL_COMMUNITY)
Admission: EM | Admit: 2020-12-14 | Discharge: 2020-12-14 | Disposition: A | Discharge status: Home / Self Care | Payer: Medicare Other | Attending: Emergency Medicine | Admitting: Emergency Medicine

## 2020-12-14 DIAGNOSIS — E039 Hypothyroidism, unspecified: Secondary | ICD-10-CM | POA: Insufficient documentation

## 2020-12-14 DIAGNOSIS — R5383 Other fatigue: Secondary | ICD-10-CM | POA: Diagnosis not present

## 2020-12-14 DIAGNOSIS — R001 Bradycardia, unspecified: Secondary | ICD-10-CM | POA: Diagnosis not present

## 2020-12-14 DIAGNOSIS — Z20822 Contact with and (suspected) exposure to covid-19: Secondary | ICD-10-CM | POA: Diagnosis not present

## 2020-12-14 DIAGNOSIS — Z79899 Other long term (current) drug therapy: Secondary | ICD-10-CM | POA: Diagnosis not present

## 2020-12-14 DIAGNOSIS — Z96642 Presence of left artificial hip joint: Secondary | ICD-10-CM | POA: Diagnosis not present

## 2020-12-14 DIAGNOSIS — R6883 Chills (without fever): Secondary | ICD-10-CM | POA: Insufficient documentation

## 2020-12-14 DIAGNOSIS — I10 Essential (primary) hypertension: Secondary | ICD-10-CM | POA: Diagnosis not present

## 2020-12-14 DIAGNOSIS — Z87891 Personal history of nicotine dependence: Secondary | ICD-10-CM | POA: Diagnosis not present

## 2020-12-14 DIAGNOSIS — Z8616 Personal history of COVID-19: Secondary | ICD-10-CM | POA: Insufficient documentation

## 2020-12-14 LAB — RESP PANEL BY RT-PCR (FLU A&B, COVID) ARPGX2
Influenza A by PCR: NEGATIVE
Influenza B by PCR: NEGATIVE
SARS Coronavirus 2 by RT PCR: NEGATIVE

## 2020-12-14 LAB — CBC WITH DIFFERENTIAL/PLATELET
Abs Immature Granulocytes: 0.01 10*3/uL (ref 0.00–0.07)
Basophils Absolute: 0.1 10*3/uL (ref 0.0–0.1)
Basophils Relative: 1 %
Eosinophils Absolute: 0.5 10*3/uL (ref 0.0–0.5)
Eosinophils Relative: 6 %
HCT: 42.5 % (ref 36.0–46.0)
Hemoglobin: 13.9 g/dL (ref 12.0–15.0)
Immature Granulocytes: 0 %
Lymphocytes Relative: 40 %
Lymphs Abs: 3 10*3/uL (ref 0.7–4.0)
MCH: 31.2 pg (ref 26.0–34.0)
MCHC: 32.7 g/dL (ref 30.0–36.0)
MCV: 95.5 fL (ref 80.0–100.0)
Monocytes Absolute: 0.5 10*3/uL (ref 0.1–1.0)
Monocytes Relative: 7 %
Neutro Abs: 3.4 10*3/uL (ref 1.7–7.7)
Neutrophils Relative %: 46 %
Platelets: 265 10*3/uL (ref 150–400)
RBC: 4.45 MIL/uL (ref 3.87–5.11)
RDW: 12.8 % (ref 11.5–15.5)
WBC: 7.5 10*3/uL (ref 4.0–10.5)
nRBC: 0 % (ref 0.0–0.2)

## 2020-12-14 LAB — COMPREHENSIVE METABOLIC PANEL
ALT: 26 U/L (ref 0–44)
AST: 26 U/L (ref 15–41)
Albumin: 4.2 g/dL (ref 3.5–5.0)
Alkaline Phosphatase: 73 U/L (ref 38–126)
Anion gap: 9 (ref 5–15)
BUN: 21 mg/dL (ref 8–23)
CO2: 25 mmol/L (ref 22–32)
Calcium: 9.6 mg/dL (ref 8.9–10.3)
Chloride: 105 mmol/L (ref 98–111)
Creatinine, Ser: 0.79 mg/dL (ref 0.44–1.00)
GFR, Estimated: >60 mL/min (ref >60)
Glucose, Bld: 101 mg/dL — ABNORMAL HIGH (ref 70–99)
Potassium: 3.9 mmol/L (ref 3.5–5.1)
Sodium: 139 mmol/L (ref 135–145)
Total Bilirubin: 0.3 mg/dL (ref 0.3–1.2)
Total Protein: 7.3 g/dL (ref 6.5–8.1)

## 2020-12-14 LAB — URINALYSIS, ROUTINE W REFLEX MICROSCOPIC
Bilirubin Urine: NEGATIVE
Glucose, UA: NEGATIVE mg/dL
Hgb urine dipstick: NEGATIVE
Ketones, ur: NEGATIVE mg/dL
Leukocytes,Ua: NEGATIVE
Nitrite: NEGATIVE
Protein, ur: NEGATIVE mg/dL
Specific Gravity, Urine: 1.004 — ABNORMAL LOW (ref 1.005–1.030)
pH: 6 (ref 5.0–8.0)

## 2020-12-14 LAB — LACTIC ACID, PLASMA: Lactic Acid, Venous: 1.2 mmol/L (ref 0.5–1.9)

## 2020-12-14 MED ORDER — SODIUM CHLORIDE 0.9 % IV BOLUS
500.0000 mL | Freq: Once | INTRAVENOUS | Status: AC
  Administered 2020-12-14: 500 mL via INTRAVENOUS

## 2020-12-14 NOTE — ED Triage Notes (Signed)
Patient reports chills and fatigue today. States she was hypothermic and hypertensive at home. Remains hypertensive in triage.

## 2020-12-14 NOTE — Discharge Instructions (Addendum)
The testing today did not show any serious problems.  Keep track of your blood pressure by checking it daily and keeping a log.  Follow-up with your PCP in 1 week for a blood pressure evaluation.

## 2020-12-14 NOTE — ED Provider Notes (Signed)
[facility] DEPT Provider Note   CSN: [id number] Arrival date & time: 12/14/20  1750     History Chief Complaint  Patient presents with  . Chills    Sandra Ellis is a 75 y.o. female.  HPI Patient here for evaluation of chills, and low body temperature.  She checked her temperature this morning after having chills and found to be 93 degrees, orally.  Later after wrapping up in blankets it was higher at 94 degrees.  She also checked her blood pressure and it was high, and her pulse was 44.  She presents by private vehicle for evaluation.  She denies cough, shortness of breath, dysuria, urinary frequency, nausea or vomiting.  She had a Covid infection about a year ago, and has not had vaccines yet.  No known sick contacts.  There are no other known modifying factors.    Past Medical History:  Diagnosis Date  . COVID-19 virus detected 09/07/2018  . Elevated cholesterol   . H/O blood clots 1990  . Hx of colonic polyp   . Hypothyroidism   . Pulmonary emboli [facility])     Patient Active Problem List   Diagnosis Date Noted  . History of total abdominal hysterectomy 05/25/2020  . Osteopenia of multiple sites 05/25/2020  . Epistaxis 11/03/2019  . Pain of left calf 05/12/2018  . Laryngopharyngeal reflux (LPR) 04/01/2018  . Fatigue 03/18/2018  . Upper airway cough syndrome vs cough variant asthma  02/17/2018  . 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 Acute bronchitis 02/15/2016  . Bronchiectasis ([place]) 02/15/2016  . Cough 02/15/2016  . Dyspnea 01/11/2016  . Pulmonary nodule 11/18/2014  . Essential hypertension, benign 12/29/2013  . Unspecified hypothyroidism 12/29/2013    Past Surgical History:  Procedure Laterality Date  . COLONOSCOPY  11/09/2014   Colonic polyp status polypectomy. Small internal hemorrhoids. Tubular adenoma.  Marland Kitchen KNEE SURGERY     cyst removed  . POLYPECTOMY    . TONSILLECTOMY    . TOTAL HIP ARTHROPLASTY Left 12/31/2016   Procedure: LEFT  TOTAL HIP ARTHROPLASTY ANTERIOR APPROACH;  Surgeon: Paralee Cancel, MD;  Location: [facility];  Service: Orthopedics;  Laterality: Left;  Marland Kitchen VAGINAL HYSTERECTOMY     TVH     OB History    Gravida  3   Para  3   Term      Preterm      AB      Living  3     SAB      IAB      Ectopic      Multiple      Live Births              Family History  Problem Relation Age of Onset  . Diabetes Mother   . Asthma Mother   . Colon cancer Neg Hx   . Esophageal cancer Neg Hx   . Pancreatic cancer Neg Hx   . Stomach cancer Neg Hx     Social History   Tobacco Use  . Smoking status: Former Smoker    Packs/day: 0.50    Years: 8.00    Pack years: 4.00    Types: Cigarettes    Quit date: 09/02/1973    Years since quitting: 47.3  . Smokeless tobacco: Never Used  Vaping Use  . Vaping Use: Never used  Substance Use Topics  . Alcohol use: No    Alcohol/week: 0.0 standard drinks  . Drug use: No    Home Medications  Prior to Admission medications   Medication Sig Start Date End Date Taking? Authorizing Provider  albuterol (VENTOLIN HFA) 108 (90 Base) MCG/ACT inhaler Inhale into the lungs. 03/09/20   [provider]  amoxicillin (AMOXIL) 500 MG capsule Take 500 mg by mouth 3 (three) times daily. 04/20/20   [provider]  Ascorbic Acid (VITAMIN C PO) Take 1 tablet by mouth daily.    [provider]  atenolol (TENORMIN) 25 MG tablet Take by mouth. 06/12/20   [provider]  Cholecalciferol (VITAMIN D3 PO) Take 2 tablets by mouth daily.    [provider]  ciclopirox (PENLAC) 8 % solution Apply topically at bedtime. Apply over nail and surrounding skin. Apply daily over previous coat. After seven (7) days, may remove with alcohol and continue cycle. 10/17/20   McDonald, Stephan Minister, DPM  Coenzyme Q10 (CO Q-10) 300 MG CAPS Take 300 mg by mouth daily.    [provider]  esomeprazole (NEXIUM) 40 MG capsule Take 1 capsule by mouth daily.  09/30/19   [provider]  fluorouracil (EFUDEX) 5 % cream 1 APPLICATION APPLY ON THE SKIN AT BEDTIME APPLY TO AFFECTED AREA NIGHTLY FOR 2 WEEKS 10/20/19   [provider]  levothyroxine (SYNTHROID, LEVOTHROID) 75 MCG tablet Take 75 mcg by mouth daily before breakfast.     [provider]  Magnesium 250 MG TABS Take 250 mg by mouth daily.     [provider]  Multiple Vitamins-Minerals (HAIR SKIN AND NAILS FORMULA PO) Take 1 tablet by mouth daily.    [provider]  Multiple Vitamins-Minerals (ZINC PO) Take 1 tablet by mouth daily.    [provider]  mupirocin ointment (BACTROBAN) 2 % Apply 1 application topically 2 (two) times daily. 10/26/20   Hyatt, Max T, DPM  Omega-3 Fatty Acids (FISH OIL) 1200 MG CAPS Take 2 capsules by mouth daily.     [provider]  Probiotic Product (PROBIOTIC PO) Take 1 tablet by mouth daily as needed.     [provider]  rosuvastatin (CRESTOR) 5 MG tablet Take 5 mg by mouth every other day.     [provider]  tretinoin (RETIN-A) 0.1 % cream Apply 1 application topically at bedtime. APPLIED TO FACE 10/10/16   [provider]    Allergies    Nitrofuran derivatives, Atorvastatin, Levaquin [levofloxacin in d5w], Pravastatin sodium, Tramadol hcl, and Ciprofloxacin  Review of Systems   Review of Systems  All other systems reviewed and are negative.   Physical Exam Updated Vital Signs BP (!) 142/74   Pulse (!) 59   Temp 98.1 F (36.7 C) (Rectal)   Resp 12   SpO2 96%   Physical Exam Vitals and nursing note reviewed.  Constitutional:      General: She is not in acute distress.    Appearance: She is well-developed. She is not ill-appearing, toxic-appearing or diaphoretic.  HENT:     Head: Normocephalic and atraumatic.     Right Ear: External ear normal.     Left Ear: External ear normal.  Eyes:     Conjunctiva/sclera: Conjunctivae normal.     Pupils: Pupils are  equal, round, and reactive to light.  Neck:     Trachea: Phonation normal.  Cardiovascular:     Rate and Rhythm: Bradycardia present.  Pulmonary:     Effort: Pulmonary effort is normal.  Abdominal:     General: There is no distension.     Palpations: Abdomen is soft.  Tenderness: There is no abdominal tenderness.  Musculoskeletal:        General: Normal range of motion.     Cervical back: Normal range of motion and neck supple.  Skin:    General: Skin is warm and dry.  Neurological:     Mental Status: She is alert and oriented to person, place, and time.     Cranial Nerves: No cranial nerve deficit.     Sensory: No sensory deficit.     Motor: No abnormal muscle tone.     Coordination: Coordination normal.  Psychiatric:        Mood and Affect: Mood normal.        Behavior: Behavior normal.        Thought Content: Thought content normal.        Judgment: Judgment normal.     ED Results / Procedures / Treatments   Labs (all labs ordered are listed, but only abnormal results are displayed) Labs Reviewed  COMPREHENSIVE METABOLIC PANEL - Abnormal; Notable for the following components:      Result Value   Glucose, Bld 101 (*)    All other components within normal limits  URINALYSIS, ROUTINE W REFLEX MICROSCOPIC - Abnormal; Notable for the following components:   Color, Urine STRAW (*)    Specific Gravity, Urine 1.004 (*)    All other components within normal limits  RESP PANEL BY RT-PCR (FLU A&B, COVID) ARPGX2  CULTURE, BLOOD (ROUTINE X 2)  CULTURE, BLOOD (ROUTINE X 2)  CBC WITH DIFFERENTIAL/PLATELET  LACTIC ACID, PLASMA    EKG EKG Interpretation  Date/Time:  Thursday December 14 2020 19:50:40 EDT Ventricular Rate:  54 PR Interval:  169 QRS Duration: 91 QT Interval:  488 QTC Calculation: 463 R Axis:   -16 Text Interpretation: Sinus rhythm Borderline left axis deviation since last tracing no significant change Confirmed by Daleen Bo [phone]) on 12/14/2020 9:14:14  PM   Radiology DG Chest Port 1 View  Result Date: 12/14/2020 CLINICAL DATA:  Chills and fatigue. EXAM: PORTABLE CHEST 1 VIEW COMPARISON:  03/18/2018 FINDINGS: Heart size upper more normal. Pulmonary vascularity normal. Lungs clear without infiltrate or effusion. Calcified granulomata in the right upper lobe unchanged. Apical pleural scarring bilaterally also unchanged. IMPRESSION: No active disease. Electronically Signed   By: Franchot Gallo M.D.   On: 12/14/2020 19:21    Procedures Procedures   Medications Ordered in ED Medications  sodium chloride 0.9 % bolus 500 mL (0 mLs Intravenous Stopped 12/14/20 2140)    ED Course  I have reviewed the triage vital signs and the nursing notes.  Pertinent labs & imaging results that were available during my care of the patient were reviewed by me and considered in my medical decision making (see chart for details).    MDM Rules/Calculators/A&P                           Patient Vitals for the past 24 hrs:  BP Temp Temp src Pulse Resp SpO2  12/14/20 2100 (!) 142/74 -- -- (!) 59 12 96 %  12/14/20 2030 133/65 -- -- 61 19 97 %  12/14/20 1953 -- 98.1 F (36.7 C) Rectal -- -- --  12/14/20 1930 (!) 153/67 -- -- (!) 51 20 97 %  12/14/20 1833 (!) 156/74 97.6 F (36.4 C) Oral 60 16 100 %  12/14/20 1830 -- -- -- 63 -- 100 %  12/14/20 1801 (!) 174/76 97.9 F (36.6 C) Oral Marland Kitchen)  57 16 100 %    9:52 PM Reevaluation with update and discussion. After initial assessment and treatment, an updated evaluation reveals blood pressure now 152/68.  She is comfortable.  She has no further complaints.  Findings discussed and questions answered. Daleen Bo   Medical Decision Making:  This patient is presenting for evaluation of chills, and low blood pressure, which does require a range of treatment options, and is a complaint that involves a high risk of morbidity and mortality. The differential diagnoses include sepsis, acute infection, hypotension from  dehydration. I decided to review old records, and in summary Ehly female presenting with nonspecific symptoms.  I did not require additional historical information from anyone.  Clinical Laboratory Tests Ordered, included CBC, Metabolic panel, Urinalysis and Lactic acid, blood cultures. Review indicates normal finding. Radiologic Tests Ordered, included chest x-ray.  I independently Visualized: Radiograph images, which show no infiltrate or edema.   Critical Interventions-clinical evaluation, laboratory testing, radiography, observation reassess  After These Interventions, the Patient was reevaluated and was found chills and abnormal vital signs.  Screening evaluation reassuring.  Mild hypertension present on repeated testing.  No evidence for hypertensive encephalopathy.  Doubt hypertensive urgency.  No evidence for infection at this time.  Patient referred to PCP for ongoing management advised to keep track of her blood pressure at home.  CRITICAL CARE-no Performed by: Daleen Bo  Nursing Notes Reviewed/ Care Coordinated Applicable Imaging Reviewed Interpretation of Laboratory Data incorporated into ED treatment  The patient appears reasonably screened and/or stabilized for discharge and I doubt any other medical condition or other [facility] requiring further screening, evaluation, or treatment in the ED at this time prior to discharge.  Plan: Home Medications-continue current; Home Treatments-rest, fluids; return here if the recommended treatment, does not improve the symptoms; Recommended follow up-PCP checkup for blood pressure evaluation in 1 to 2 weeks.     Final Clinical Impression(s) / ED Diagnoses Final diagnoses:  Chills (without fever)  Hypertension, unspecified type    Rx / DC Orders ED Discharge Orders    None       Daleen Bo, MD 12/14/20 2203

## 2020-12-18 DIAGNOSIS — J069 Acute upper respiratory infection, unspecified: Secondary | ICD-10-CM | POA: Diagnosis not present

## 2020-12-19 LAB — CULTURE, BLOOD (ROUTINE X 2)
Culture: NO GROWTH
Culture: NO GROWTH
Special Requests: ADEQUATE
Special Requests: ADEQUATE

## 2021-01-25 DIAGNOSIS — H5203 Hypermetropia, bilateral: Secondary | ICD-10-CM | POA: Diagnosis not present

## 2021-01-25 DIAGNOSIS — H2513 Age-related nuclear cataract, bilateral: Secondary | ICD-10-CM | POA: Diagnosis not present

## 2021-01-25 DIAGNOSIS — H52203 Unspecified astigmatism, bilateral: Secondary | ICD-10-CM | POA: Diagnosis not present

## 2021-02-23 DIAGNOSIS — R399 Unspecified symptoms and signs involving the genitourinary system: Secondary | ICD-10-CM | POA: Diagnosis not present

## 2021-02-23 DIAGNOSIS — N390 Urinary tract infection, site not specified: Secondary | ICD-10-CM | POA: Diagnosis not present

## 2021-02-26 ENCOUNTER — Other Ambulatory Visit: Payer: Self-pay | Admitting: Family Medicine

## 2021-02-26 ENCOUNTER — Ambulatory Visit
Admission: RE | Admit: 2021-02-26 | Discharge: 2021-02-26 | Disposition: A | Discharge status: Home / Self Care | Payer: Medicare Other | Source: Ambulatory Visit | Attending: Family Medicine | Admitting: Family Medicine

## 2021-02-26 DIAGNOSIS — R109 Unspecified abdominal pain: Secondary | ICD-10-CM

## 2021-02-26 DIAGNOSIS — Z90710 Acquired absence of both cervix and uterus: Secondary | ICD-10-CM | POA: Diagnosis not present

## 2021-02-26 DIAGNOSIS — R102 Pelvic and perineal pain: Secondary | ICD-10-CM | POA: Diagnosis not present

## 2021-02-26 DIAGNOSIS — M1611 Unilateral primary osteoarthritis, right hip: Secondary | ICD-10-CM | POA: Diagnosis not present

## 2021-02-26 DIAGNOSIS — I700 Atherosclerosis of aorta: Secondary | ICD-10-CM | POA: Diagnosis not present

## 2021-03-15 DIAGNOSIS — M545 Low back pain, unspecified: Secondary | ICD-10-CM | POA: Diagnosis not present

## 2021-03-21 DIAGNOSIS — E7800 Pure hypercholesterolemia, unspecified: Secondary | ICD-10-CM | POA: Diagnosis not present

## 2021-03-21 DIAGNOSIS — E039 Hypothyroidism, unspecified: Secondary | ICD-10-CM | POA: Diagnosis not present

## 2021-03-21 DIAGNOSIS — E559 Vitamin D deficiency, unspecified: Secondary | ICD-10-CM | POA: Diagnosis not present

## 2021-03-21 DIAGNOSIS — E119 Type 2 diabetes mellitus without complications: Secondary | ICD-10-CM | POA: Diagnosis not present

## 2021-03-29 DIAGNOSIS — R110 Nausea: Secondary | ICD-10-CM | POA: Diagnosis not present

## 2021-04-16 DIAGNOSIS — I214 Non-ST elevation (NSTEMI) myocardial infarction: Secondary | ICD-10-CM | POA: Diagnosis not present

## 2021-04-16 DIAGNOSIS — R42 Dizziness and giddiness: Secondary | ICD-10-CM | POA: Diagnosis not present

## 2021-04-16 DIAGNOSIS — E785 Hyperlipidemia, unspecified: Secondary | ICD-10-CM | POA: Diagnosis not present

## 2021-04-16 DIAGNOSIS — R9431 Abnormal electrocardiogram [ECG] [EKG]: Secondary | ICD-10-CM | POA: Diagnosis not present

## 2021-04-16 DIAGNOSIS — Z20822 Contact with and (suspected) exposure to covid-19: Secondary | ICD-10-CM | POA: Diagnosis not present

## 2021-04-16 DIAGNOSIS — R0789 Other chest pain: Secondary | ICD-10-CM | POA: Diagnosis not present

## 2021-04-16 DIAGNOSIS — E039 Hypothyroidism, unspecified: Secondary | ICD-10-CM | POA: Diagnosis not present

## 2021-04-16 DIAGNOSIS — G43909 Migraine, unspecified, not intractable, without status migrainosus: Secondary | ICD-10-CM | POA: Diagnosis not present

## 2021-04-16 DIAGNOSIS — I10 Essential (primary) hypertension: Secondary | ICD-10-CM | POA: Diagnosis not present

## 2021-04-17 DIAGNOSIS — G43909 Migraine, unspecified, not intractable, without status migrainosus: Secondary | ICD-10-CM | POA: Diagnosis present

## 2021-04-17 DIAGNOSIS — I10 Essential (primary) hypertension: Secondary | ICD-10-CM | POA: Diagnosis present

## 2021-04-17 DIAGNOSIS — Z888 Allergy status to other drugs, medicaments and biological substances status: Secondary | ICD-10-CM | POA: Diagnosis not present

## 2021-04-17 DIAGNOSIS — Z7982 Long term (current) use of aspirin: Secondary | ICD-10-CM | POA: Diagnosis not present

## 2021-04-17 DIAGNOSIS — I351 Nonrheumatic aortic (valve) insufficiency: Secondary | ICD-10-CM | POA: Diagnosis not present

## 2021-04-17 DIAGNOSIS — E039 Hypothyroidism, unspecified: Secondary | ICD-10-CM | POA: Diagnosis present

## 2021-04-17 DIAGNOSIS — R001 Bradycardia, unspecified: Secondary | ICD-10-CM | POA: Diagnosis not present

## 2021-04-17 DIAGNOSIS — I495 Sick sinus syndrome: Secondary | ICD-10-CM | POA: Diagnosis present

## 2021-04-17 DIAGNOSIS — E785 Hyperlipidemia, unspecified: Secondary | ICD-10-CM | POA: Diagnosis present

## 2021-04-17 DIAGNOSIS — I214 Non-ST elevation (NSTEMI) myocardial infarction: Secondary | ICD-10-CM | POA: Diagnosis present

## 2021-04-17 DIAGNOSIS — R9431 Abnormal electrocardiogram [ECG] [EKG]: Secondary | ICD-10-CM | POA: Diagnosis not present

## 2021-04-17 DIAGNOSIS — Z20822 Contact with and (suspected) exposure to covid-19: Secondary | ICD-10-CM | POA: Diagnosis present

## 2021-04-17 DIAGNOSIS — I160 Hypertensive urgency: Secondary | ICD-10-CM | POA: Diagnosis present

## 2021-04-24 DIAGNOSIS — E039 Hypothyroidism, unspecified: Secondary | ICD-10-CM | POA: Diagnosis not present

## 2021-04-24 DIAGNOSIS — I10 Essential (primary) hypertension: Secondary | ICD-10-CM | POA: Diagnosis not present

## 2021-04-24 DIAGNOSIS — R001 Bradycardia, unspecified: Secondary | ICD-10-CM | POA: Diagnosis not present

## 2021-04-24 DIAGNOSIS — E782 Mixed hyperlipidemia: Secondary | ICD-10-CM | POA: Diagnosis not present

## 2021-05-09 DIAGNOSIS — I10 Essential (primary) hypertension: Secondary | ICD-10-CM | POA: Diagnosis not present

## 2021-05-09 DIAGNOSIS — M545 Low back pain, unspecified: Secondary | ICD-10-CM | POA: Diagnosis not present

## 2021-05-31 ENCOUNTER — Ambulatory Visit (INDEPENDENT_AMBULATORY_CARE_PROVIDER_SITE_OTHER): Payer: Medicare Other | Admitting: Nurse Practitioner

## 2021-05-31 ENCOUNTER — Other Ambulatory Visit: Payer: Self-pay

## 2021-05-31 ENCOUNTER — Encounter: Payer: Self-pay | Admitting: Nurse Practitioner

## 2021-05-31 VITALS — BP 144/86 | Ht 66.5 in | Wt 143.0 lb

## 2021-05-31 DIAGNOSIS — R1030 Lower abdominal pain, unspecified: Secondary | ICD-10-CM | POA: Diagnosis not present

## 2021-05-31 DIAGNOSIS — Z01419 Encounter for gynecological examination (general) (routine) without abnormal findings: Secondary | ICD-10-CM

## 2021-05-31 DIAGNOSIS — R140 Abdominal distension (gaseous): Secondary | ICD-10-CM

## 2021-05-31 DIAGNOSIS — Z780 Asymptomatic menopausal state: Secondary | ICD-10-CM

## 2021-05-31 DIAGNOSIS — M8589 Other specified disorders of bone density and structure, multiple sites: Secondary | ICD-10-CM

## 2021-05-31 NOTE — Progress Notes (Signed)
   Sandra Ellis 02-Sep-1946 [id number]   History:  75 y.o. G3P3 presents for breast and pelvic exam. Complains of bilateral lower abdominal/pelvic pain and bloating x 2-3 months. Describes the pain as sore/achy and intermittent. Denies changes in bowel habits. Postmenopausal - no HRT. S/P 1995 TAH for DUB. Normal pap and mammogram history. History of osteopenia, declines further screenings. HTN, HLD managed by PCP.  Gynecologic History No LMP recorded. Patient has had a hysterectomy.   Health maintenance Last Pap: No longer screening per guidelines Last mammogram: 10/2020. Results were: normal Last colonoscopy: 07/10/2018. Results were: tubular adenomas, 5-year recall Last Dexa: 2001. Results were: osteopenia  Past medical history, past surgical history, family history and social history were all reviewed and documented in the EPIC chart. Married. Retired but still working for [employer].   ROS:  A ROS was performed and pertinent positives and negatives are included.  Exam:  Vitals:   05/31/21 1048  BP: (!) 144/86  Weight: 143 lb (64.9 kg)  Height: 5' 6.5" (1.689 m)    Body mass index is 22.74 kg/m.  General appearance:  Normal Thyroid:  Symmetrical, normal in size, without palpable masses or nodularity. Respiratory  Auscultation:  Clear without wheezing or rhonchi Cardiovascular  Auscultation:  Regular rate, without rubs, murmurs or gallops  Edema/varicosities:  Not grossly evident Abdominal  Soft, mild tenderness lower abdomen, without masses, guarding or rebound.  Liver/spleen:  No organomegaly noted  Hernia:  None appreciated  Skin  Inspection:  Grossly normal   Breasts: Examined lying and sitting.   Right: Without masses, retractions, discharge or axillary adenopathy.   Left: Without masses, retractions, discharge or axillary adenopathy. Gentitourinary   Inguinal/mons:  Normal without inguinal adenopathy  External genitalia:  Normal  BUS/Urethra/Skene's glands:   Normal  Vagina:  Atrophic changes  Cervix:  Absent  Uterus:  Absent  Adnexa/parametria:     Rt: Without masses or tenderness.   Lt: Without masses or tenderness.  Anus and perineum: Normal  Digital rectal exam: Normal sphincter tone without palpated masses or tenderness  Patient informed chaperone available to be present for breast and pelvic exam. Patient has requested no chaperone to be present. Patient has been advised what will be completed during breast and pelvic exam.   Assessment/Plan:  75 y.o. G3P3 for breast and pelvic exam.   Well female exam with routine gynecological exam - Education provided on SBEs, importance of preventative screenings, current guidelines, high calcium diet, regular exercise, and multivitamin daily.  Labs with PCP.   Postmenopausal - no HRT. S/P 1995 TAH for DUB.  Osteopenia of multiple sites - declines further screenings. Taking daily Vitamin D + calcium, swims and exercises most days of the week.   Abdominal bloating - Plan: US PELVIS TRANSVAGINAL NON-OB (TV ONLY). Complains of bilateral lower abdominal/pelvic pain and bloating x 2-3 months. Describes the pain as sore/achy and intermittent. Denies changes in bowel habits.   Lower abdominal pain - Plan: US PELVIS TRANSVAGINAL NON-OB (TV ONLY)  Screening for cervical cancer - Normal Pap history. No longer screening per guidelines.   Screening for breast cancer - Normal mammogram history.  Continue annual screenings.  Normal breast exam today.  Screening for colon cancer - 2019 colonoscopy. Will repeat at GI's recommended interval.   Follow-up in 1 year for annual.      Tamela Gammon [facility], 11:25 AM 05/31/2021

## 2021-06-04 DIAGNOSIS — R009 Unspecified abnormalities of heart beat: Secondary | ICD-10-CM | POA: Diagnosis not present

## 2021-06-04 DIAGNOSIS — I10 Essential (primary) hypertension: Secondary | ICD-10-CM | POA: Diagnosis not present

## 2021-06-14 ENCOUNTER — Other Ambulatory Visit: Payer: Self-pay

## 2021-06-14 ENCOUNTER — Ambulatory Visit (INDEPENDENT_AMBULATORY_CARE_PROVIDER_SITE_OTHER): Payer: Medicare Other

## 2021-06-14 ENCOUNTER — Ambulatory Visit (INDEPENDENT_AMBULATORY_CARE_PROVIDER_SITE_OTHER): Payer: Medicare Other | Admitting: Obstetrics and Gynecology

## 2021-06-14 ENCOUNTER — Encounter: Payer: Self-pay | Admitting: Obstetrics and Gynecology

## 2021-06-14 VITALS — BP 124/60 | HR 70 | Ht 66.5 in | Wt 143.0 lb

## 2021-06-14 DIAGNOSIS — R1030 Lower abdominal pain, unspecified: Secondary | ICD-10-CM

## 2021-06-14 DIAGNOSIS — N952 Postmenopausal atrophic vaginitis: Secondary | ICD-10-CM | POA: Diagnosis not present

## 2021-06-14 DIAGNOSIS — R140 Abdominal distension (gaseous): Secondary | ICD-10-CM

## 2021-06-14 DIAGNOSIS — R102 Pelvic and perineal pain: Secondary | ICD-10-CM

## 2021-06-14 NOTE — Progress Notes (Signed)
GYNECOLOGY  VISIT   HPI: 75 y.o.   Married  Caucasian  female   G3P3 with No LMP recorded. Patient has had a hysterectomy.   here for pelvic ultrasound for lower abdominal and pelvic pain and bloating for 2 -3 months duration.  Her symptoms resolved.  She correlates her symptoms with sexual activity.  Her symptoms go away and recur with sexual activity.  She uses baby oil for lubrication with sex.  States she saw her PCP about her symptoms and was told everything is ok.   Status post hysterectomy.   GYNECOLOGIC HISTORY: No LMP recorded. Patient has had a hysterectomy. Contraception:  Hyst Menopausal hormone therapy:  none Last mammogram:  11-04-19  Neg/BiRads1,  States she had it done this this at [place].  Last pap smear:   No abnormal pap history        OB History     Gravida  3   Para  3   Term      Preterm      AB      Living  3      SAB      IAB      Ectopic      Multiple      Live Births                 Patient Active Problem List   Diagnosis Date Noted   History of total abdominal hysterectomy 05/25/2020   Osteopenia of multiple sites 05/25/2020   Epistaxis 11/03/2019   Pain of left calf 05/12/2018   Laryngopharyngeal reflux (LPR) 04/01/2018   Fatigue 03/18/2018   Upper airway cough syndrome vs cough variant asthma  02/17/2018   S/P left THA, AA 12/31/2016   Acute bronchitis 02/15/2016   Bronchiectasis ([place]) 02/15/2016   Cough 02/15/2016   Dyspnea 01/11/2016   Pulmonary nodule 11/18/2014   Essential hypertension, benign 12/29/2013   Unspecified hypothyroidism 12/29/2013    Past Medical History:  Diagnosis Date   COVID-19 virus detected 09/07/2018   Elevated cholesterol    H/O blood clots 1990   Hx of colonic polyp    Hypothyroidism    Pulmonary emboli [facility])     Past Surgical History:  Procedure Laterality Date   COLONOSCOPY  11/09/2014   Colonic polyp status polypectomy. Small internal hemorrhoids. Tubular adenoma.   KNEE  SURGERY     cyst removed   POLYPECTOMY     TONSILLECTOMY     TOTAL HIP ARTHROPLASTY Left 12/31/2016   Procedure: LEFT TOTAL HIP ARTHROPLASTY ANTERIOR APPROACH;  Surgeon: Paralee Cancel, MD;  Location: [facility];  Service: Orthopedics;  Laterality: Left;   VAGINAL HYSTERECTOMY     TVH    Current Outpatient Medications  Medication Sig Dispense Refill   amLODipine (NORVASC) 2.5 MG tablet Take 2.5 mg by mouth daily.     Ascorbic Acid (VITAMIN C PO) Take 1 tablet by mouth daily.     Calcium-Magnesium-Vitamin D (CALCIUM 1200+D3 PO) Take by mouth.     Cholecalciferol (VITAMIN D3 PO) Take 2 tablets by mouth daily.     Coenzyme Q10 (CO Q-10) 300 MG CAPS Take 300 mg by mouth daily.     fluorouracil (EFUDEX) 5 % cream 1 APPLICATION APPLY ON THE SKIN AT BEDTIME APPLY TO AFFECTED AREA NIGHTLY FOR 2 WEEKS     levothyroxine (SYNTHROID, LEVOTHROID) 75 MCG tablet Take 75 mcg by mouth daily before breakfast.      Magnesium 250 MG TABS Take 250 mg by  mouth daily.      Multiple Vitamins-Minerals (HAIR SKIN AND NAILS FORMULA PO) Take 1 tablet by mouth daily.     Multiple Vitamins-Minerals (ZINC PO) Take 1 tablet by mouth daily.     Omega-3 Fatty Acids (FISH OIL) 1200 MG CAPS Take 2 capsules by mouth daily.      Probiotic Product (PROBIOTIC PO) Take 1 tablet by mouth daily as needed.      rosuvastatin (CRESTOR) 5 MG tablet Take 5 mg by mouth every other day.      tretinoin (RETIN-A) 0.1 % cream Apply 1 application topically at bedtime. APPLIED TO FACE     losartan (COZAAR) 25 MG tablet Take 25 mg by mouth daily. (Patient not taking: Reported on 06/14/2021)     propranolol (INDERAL) 10 MG tablet Take 10 mg by mouth 2 (two) times daily. (Patient not taking: Reported on 06/14/2021)     No current facility-administered medications for this visit.     ALLERGIES: Nitrofuran derivatives, Atorvastatin, Levaquin [levofloxacin in d5w], Pravastatin sodium, Tramadol hcl, and Ciprofloxacin  Family History  Problem  Relation Age of Onset   Diabetes Mother    Asthma Mother    Colon cancer Neg Hx    Esophageal cancer Neg Hx    Pancreatic cancer Neg Hx    Stomach cancer Neg Hx     Social History   Socioeconomic History   Marital status: Married    Spouse name: Not on file   Number of children: 3   Years of education: Not on file   Highest education level: Not on file  Occupational History   Occupation: minister  Tobacco Use   Smoking status: Former    Packs/day: 0.50    Years: 8.00    Pack years: 4.00    Types: Cigarettes    Quit date: 09/02/1973    Years since quitting: 47.8   Smokeless tobacco: Never  Vaping Use   Vaping Use: Never used  Substance and Sexual Activity   Alcohol use: No    Alcohol/week: 0.0 standard drinks   Drug use: No   Sexual activity: Yes    Birth control/protection: Surgical    Comment: FIRST INTERCORSE AGE 56- ONE SEXUAL PARTNER   Other Topics Concern   Not on file  Social History Narrative   Not on file   Social Determinants of Health   Financial Resource Strain: Not on file  Food Insecurity: Not on file  Transportation Needs: Not on file  Physical Activity: Not on file  Stress: Not on file  Social Connections: Not on file  Intimate Partner Violence: Not on file    Review of Systems  All other systems reviewed and are negative.  PHYSICAL EXAMINATION:    BP 124/60   Pulse 70   Ht 5' 6.5" (1.689 m)   Wt 143 lb (64.9 kg)   SpO2 99%   BMI 22.74 kg/m     General appearance: alert, cooperative and appears stated age  Pelvic US  Uterus absent.  Left ovary 1.94 x 1.14 cm.  Right ovary 2.15 x 1.32 x 1.01 cm.  No free fluid.  No adnexal masses.  Pelvic:  Declined.   ASSESSMENT  Status post TVH.  Pelvic pain.  Abdominal bloating.  Suspect vaginal atrophy.  PLAN  Pelvic US images and reports reviewed.  Reassurance regarding appearance of ovaries on ultrasound.  Lubricants for vaginal discussed:  water based lubricants:  [place] Jelly,  Replens, Astroglide, cooking oils:  olive oil, canola oil,  coconut oil, and vitamin E vaginal suppositories.  I recommended against using baby oil.  Fu prn.    An After Visit Summary was printed and given to the patient.  13 min  total time was spent for this patient encounter, including preparation, face-to-face counseling with the patient, coordination of care, and documentation of the encounter.

## 2021-06-25 ENCOUNTER — Other Ambulatory Visit: Payer: Self-pay

## 2021-06-25 ENCOUNTER — Ambulatory Visit (INDEPENDENT_AMBULATORY_CARE_PROVIDER_SITE_OTHER): Payer: Medicare Other | Admitting: Cardiovascular Disease

## 2021-06-25 ENCOUNTER — Encounter: Payer: Self-pay | Admitting: Cardiovascular Disease

## 2021-06-25 VITALS — BP 148/71 | HR 54 | Ht 67.0 in | Wt 142.4 lb

## 2021-06-25 DIAGNOSIS — E039 Hypothyroidism, unspecified: Secondary | ICD-10-CM | POA: Diagnosis not present

## 2021-06-25 DIAGNOSIS — I10 Essential (primary) hypertension: Secondary | ICD-10-CM

## 2021-06-25 DIAGNOSIS — E785 Hyperlipidemia, unspecified: Secondary | ICD-10-CM | POA: Diagnosis not present

## 2021-06-25 DIAGNOSIS — Z8669 Personal history of other diseases of the nervous system and sense organs: Secondary | ICD-10-CM

## 2021-06-25 MED ORDER — ROSUVASTATIN CALCIUM 5 MG PO TABS: 5.0000 mg | ORAL_TABLET | Freq: Every day | ORAL | 3 refills | Status: DC

## 2021-06-25 MED ORDER — HYDROCHLOROTHIAZIDE 12.5 MG PO TABS: 12.5000 mg | ORAL_TABLET | Freq: Every day | ORAL | 3 refills | Status: DC

## 2021-06-25 NOTE — Patient Instructions (Addendum)
Medication Instructions:  DECREASE HCTZ TO 12. 5 MG EVERY DAY. KEEP A RECORD OF YOUR BLOOD PRESSURE. IF IT STARTS TO ELEVATE, CALL THE CLINIC. TAKE ROSUVASTATIN 5 MG EVERY DAY. *If you need a refill on your cardiac medications before your next appointment, please call your pharmacy*  Follow-Up: At [facility], you and your health needs are our priority.  As part of our continuing mission to provide you with exceptional heart care, we have created designated Provider Care Teams.  These Care Teams include your primary Cardiologist (physician) and Advanced Practice Providers (APPs -  Physician Assistants and Nurse Practitioners) who all work together to provide you with the care you need, when you need it.  We recommend signing up for the patient portal called "MyChart".  Sign up information is provided on this After Visit Summary.  MyChart is used to connect with patients for Virtual Visits (Telemedicine).  Patients are able to view lab/test results, encounter notes, upcoming appointments, etc.  Non-urgent messages can be sent to your provider as well.   To learn more about what you can do with MyChart, go to [email or web address].    Your next appointment:   12 MONTHS  The format for your next appointment:   In Person  Provider:   Shelva Majestic, MD

## 2021-06-25 NOTE — Progress Notes (Signed)
Cardiology Office Note    Date:  07/01/2021   ID:  Sandra Ellis, DOB 03-30-1946, MRN [id number]  PCP:  Shirline Frees, MD  Cardiologist:  Shelva Majestic, MD   F/U cardiology evaluation initially referred by [facility] emergency room and Dr.Harris for abnormal ECG in June 2019.   History of Present Illness:  Sandra Ellis is a 75 y.o. female who was initiatally referred by [facility] emergency room after the patient was evaluated by Dr.Charles Graeub.  She is now referred by Dr. Kenton Kingfisher legal physicians following her presentation to the emergency room while in [place] in August 2022 with significant blood pressure elevation.  Ms. Sandra Ellis is followed by Dr. Shirline Frees for primary care.  She has a remote history of migraine headaches and was evaluated at the [facility] 25 years ago and was placed on atenolol with significant benefit and resolution of her migraine headaches.  She has a history of occasional swelling for which she has taken HCTZ.  She has been on rosuvastatin 5 mg every other day for mild hyperlipidemia.  She has a history of spastic colon and also a history of hypothyroidism.  On February 07, 2018 she was evaluated at [facility] emergency room with complaints of cough, pleuritic chest pain, and shortness of breath.  He states that there was an occasion where she woke up and it was difficult to breathe and she had experienced associated sweating during this episode 2 weeks previously.  Previously she was diagnosed with bronchitis and had been treated with Z-Pak with some improvement but her cough had recurred.  Initially, 2 years ago she also had an episode of bronc Nilda Calamity that was hard to resolve.  During her evaluation in the emergency room, her ECG is unchanged from her prior ECG in January 2019 and suggested inferior Q waves and poor anterior R wave progression.  Her heart score was 3.  A CT of her chest did not show acute cardiopulmonary disease.  Small liver  hypodensities were noted, too small to characterize were felt most likely cysts.  Chest x-ray was unremarkable but suggested prior granulomatous disease.  It was recommended that the patient have an echocardiogram.  When I initially evaluated her she admitted to mild vague chest pressure which is not characteristically exertional.  I recommended she undergo an echo Doppler study as well as a coronary CTA to assess for aortic as well as coronary atherosclerosis.  Her echo Doppler study done on February 23, 2018 showed an EF of 60 to 65% with normal wall motion.  There was mild aortic regurgitation and trivial tricuspid regurgitation.  She underwent coronary CTA on April 02, 2018 which showed a calcium score of 0.  Her aortic root was normal at 3.5 cm.  She was not found to have any significant noncardiac abnormality.  Since I last saw her, she has been followed by Dr. Kenton Kingfisher.  She has had issues with hypertension and apparently presented to the emergency room while in [place] where her blood pressure was elevated around 200/100 and her pulse was low.  Troponin I initially was 4.1 and decreased to 2.1.  Apparently she was admitted for 2 days and underwent a cardiac catheterization and echocardiogram which were normal.  At the time she was on atenolol and this was discontinued due to bradycardia.  When she saw Dr. Shirline Frees at [facility] on May 09, 2021 he recommended the addition of amlodipine to her medical regimen.  Apparently in the past  she had been treated with a atenolol which improved her migraine headaches.  apparently she states she did not tolerate the amlodipine.  She subsequently was given a prescription for losartan but never started this.  She has remained active and swims 5 days/week for at least 20 minutes, goes to the gym and often walks on a treadmill at home.  Recently she has been drinking 90 ounces of fruit juice per day and eating bananas.  She has been eating beets.  She currently  is taking rosuvastatin 5 mg every other day.  She is on levothyroxine 75 mcg.  She denies any chest pain.  She presents now for evaluation.  Past Medical History:  Diagnosis Date   COVID-19 virus detected 09/07/2018   Elevated cholesterol    H/O blood clots 1990   Hx of colonic polyp    Hypothyroidism    Pulmonary emboli [facility])     Past Surgical History:  Procedure Laterality Date   COLONOSCOPY  11/09/2014   Colonic polyp status polypectomy. Small internal hemorrhoids. Tubular adenoma.   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   Procedure: LEFT TOTAL HIP ARTHROPLASTY ANTERIOR APPROACH;  Surgeon: Paralee Cancel, MD;  Location: [facility] ORS;  Service: Orthopedics;  Laterality: Left;   VAGINAL HYSTERECTOMY     TVH    Current Medications: Outpatient Medications Prior to Visit  Medication Sig Dispense Refill   Ascorbic Acid (VITAMIN C PO) Take 1 tablet by mouth daily.     BIOTIN PO Take 1 tablet by mouth daily in the afternoon.     Cholecalciferol (VITAMIN D3 PO) Take 5,000 Units by mouth daily. Every other day     CINNAMON PO Take 2 tablets by mouth daily in the afternoon.     Coenzyme Q10 (CO Q-10) 300 MG CAPS Take 300 mg by mouth daily.     fluorouracil (EFUDEX) 5 % cream 1 APPLICATION APPLY ON THE SKIN AT BEDTIME APPLY TO AFFECTED AREA NIGHTLY FOR 2 WEEKS     HAWTHORN BERRY PO Take 2 tablets by mouth daily in the afternoon.     L-ARGININE PO Take 1 tablet by mouth daily in the afternoon.     levothyroxine (SYNTHROID, LEVOTHROID) 75 MCG tablet Take 75 mcg by mouth daily before breakfast.      Magnesium 250 MG TABS Take 250 mg by mouth daily.      Multiple Vitamins-Minerals (HAIR SKIN AND NAILS FORMULA PO) Take 1 tablet by mouth daily.     Multiple Vitamins-Minerals (ZINC PO) Take 1 tablet by mouth daily.     NON FORMULARY Take 1 tablet by mouth daily in the afternoon. Beet     Omega-3 Fatty Acids (FISH OIL) 1200 MG CAPS  Take 2 capsules by mouth daily.      Potassium (POTASSIMIN PO) Take 1 tablet by mouth daily.     Probiotic Product (PROBIOTIC PO) Take 1 tablet by mouth daily as needed.      tretinoin (RETIN-A) 0.1 % cream Apply 1 application topically at bedtime. APPLIED TO FACE     hydrochlorothiazide (HYDRODIURIL) 25 MG tablet Take 25 mg by mouth daily.     rosuvastatin (CRESTOR) 5 MG tablet Take 5 mg by mouth every other day.      amLODipine (NORVASC) 2.5 MG tablet Take 2.5 mg by mouth daily. (Patient not taking: Reported on 06/25/2021)     Calcium-Magnesium-Vitamin D (CALCIUM  1200+D3 PO) Take by mouth. (Patient not taking: Reported on 06/25/2021)     losartan (COZAAR) 25 MG tablet Take 25 mg by mouth daily. (Patient not taking: No sig reported)     propranolol (INDERAL) 10 MG tablet Take 10 mg by mouth 2 (two) times daily. (Patient not taking: Reported on 06/25/2021)     No facility-administered medications prior to visit.     Allergies:   Nitrofuran derivatives, Atorvastatin, Levaquin [levofloxacin in d5w], Pravastatin sodium, Tramadol hcl, and Ciprofloxacin   Social History   Socioeconomic History   Marital status: Married    Spouse name: Not on file   Number of children: 3   Years of education: Not on file   Highest education level: Not on file  Occupational History   Occupation: minister  Tobacco Use   Smoking status: Former    Packs/day: 0.50    Years: 8.00    Pack years: 4.00    Types: Cigarettes    Quit date: 09/02/1973    Years since quitting: 47.8   Smokeless tobacco: Never  Vaping Use   Vaping Use: Never used  Substance and Sexual Activity   Alcohol use: No    Alcohol/week: 0.0 standard drinks   Drug use: No   Sexual activity: Yes    Birth control/protection: Surgical    Comment: FIRST INTERCORSE AGE 80- ONE SEXUAL PARTNER   Other Topics Concern   Not on file  Social History Narrative   Not on file   Social Determinants of Health   Financial Resource Strain: Not on  file  Food Insecurity: Not on file  Transportation Needs: Not on file  Physical Activity: Not on file  Stress: Not on file  Social Connections: Not on file    She has been married for 75 years and has 3 children, 13 grandchildren and 1 great grandchild.  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  There is no alcohol or tobacco use.  She exercises at least 5 days/week either swimming, going to the gym or treadmill for typically 1 to 2 hours at a time.  Hypertension is present in 2 of her children 1 with POTS disease.  Family History:  The patient's family history includes Asthma in her mother; Diabetes in her mother.   Both parents are deceased, mother at age 33 and father at age 87.  She has 3 brothers and 1 sister.  ROS General: Negative; No fevers, chills, or night sweats;  HEENT: Negative; No changes in vision or hearing, sinus congestion, difficulty swallowing Pulmonary: Negative; No cough, wheezing, shortness of breath, hemoptysis Cardiovascular: Negative; No chest pain, presyncope, syncope, palpitations GI: Negative; No nausea, vomiting, diarrhea, or abdominal pain GU: She had prior kidney stone Musculoskeletal: Negative; no myalgias, joint pain, or weakness Hematologic/Oncology: Negative; no easy bruising, bleeding Endocrine: Negative; no heat/cold intolerance; no diabetes Neuro: Negative; no changes in balance, headaches Skin: Negative; No rashes or skin lesions Psychiatric: Negative; No behavioral problems, depression Sleep: Negative; No snoring, daytime sleepiness, hypersomnolence, bruxism, restless legs, hypnogognic hallucinations, no cataplexy An Epworth  sleepiness scale score was calculated in the office today and this endorsed at 3 arguing against daytime sleepiness. Other comprehensive 14 point system review is negative.   PHYSICAL EXAM:   VS:  BP (!) 148/71 (BP Location: Left Arm, Patient Position: Sitting, Cuff Size: Normal)   Pulse (!) 54   Ht $R'5\' 7"'VX$   (1.702 m)   Wt 142 lb 6.4 oz (64.6 kg)   SpO2  93%   BMI 22.30 kg/m     Repeat blood pressure by me was 124/70  Wt Readings from Last 3 Encounters:  06/25/21 142 lb 6.4 oz (64.6 kg)  06/14/21 143 lb (64.9 kg)  05/31/21 143 lb (64.9 kg)    General: Alert, oriented, no distress.  Skin: normal turgor, no rashes, warm and dry HEENT: Normocephalic, atraumatic. Pupils equal round and reactive to light; sclera anicteric; extraocular muscles intact;  Nose without nasal septal hypertrophy Mouth/Parynx benign; Mallinpatti scale 3 Neck: No JVD, no carotid bruits; normal carotid upstroke Lungs: clear to ausculatation and percussion; no wheezing or rales Chest wall: without tenderness to palpitation Heart: PMI not displaced, RRR, s1 s2 normal, 1/6 systolic murmur, no diastolic murmur, no rubs, gallops, thrills, or heaves Abdomen: soft, nontender; no hepatosplenomehaly, BS+; abdominal aorta nontender and not dilated by palpation. Back: no CVA tenderness Pulses 2+ Musculoskeletal: full range of motion, normal strength, no joint deformities Extremities: no clubbing cyanosis or edema, Homan's sign negative  Neurologic: grossly nonfocal; Cranial nerves grossly wnl Psychologic: Normal mood and affect   Studies/Labs Reviewed:   June 25, 2021 ECG (independently read by me): Sinus bradycardia at 54, Left axis deviation  February 19 2018 ECG (independently read by me): Sinus bradycardia at 51 bpm.  Q waves in lead III.  Poor anterior R wave progression.  Normal intervals.  No ectopy.  Recent Labs: BMP Latest Ref Rng & Units 12/14/2020 04/02/2018 01/23/2017  Glucose 70 - 99 mg/dL 101(H) - 105(H)  BUN 8 - 23 mg/dL 21 - 10  Creatinine 0.44 - 1.00 mg/dL 0.79 0.80 0.77  Sodium 135 - 145 mmol/L 139 - 128(L)  Potassium 3.5 - 5.1 mmol/L 3.9 - 3.8  Chloride 98 - 111 mmol/L 105 - 94(L)  CO2 22 - 32 mmol/L 25 - 26  Calcium 8.9 - 10.3 mg/dL 9.6 - 9.5     Hepatic Function Latest Ref Rng & Units 12/14/2020  01/23/2017 02/22/2016  Total Protein 6.5 - 8.1 g/dL 7.3 6.7 7.0  Albumin 3.5 - 5.0 g/dL 4.2 3.9 4.3  AST 15 - 41 U/L _0 ALT 0 - 44 U/L 26 18 34  Alk Phosphatase 38 - 126 U/L 73 88 55  Total Bilirubin 0.3 - 1.2 mg/dL 0.3 0.6 0.4  Bilirubin, Direct 0.0 - 0.3 mg/dL - - 0.0    CBC Latest Ref Rng & Units 12/14/2020 03/18/2018 01/23/2017  WBC 4.0 - 10.5 K/uL 7.5 12.6(H) 9.5  Hemoglobin 12.0 - 15.0 g/dL 13.9 14.4 12.8  Hematocrit 36.0 - 46.0 % 42.5 43.2 37.4  Platelets 150 - 400 K/uL 265 336.0 416(H)   Lab Results  Component Value Date   MCV 95.5 12/14/2020   MCV 92.9 03/18/2018   MCV 90.6 01/23/2017   Lab Results  Component Value Date   No results found for: HGBA1C   BNP No results found for: BNP  ProBNP No results found for: PROBNP   Lipid Panel  No results found for: CHOL, TRIG, HDL, CHOLHDL, VLDL, LDLCALC, LDLDIRECT   RADIOLOGY: US PELVIS TRANSVAGINAL NON-OB (TV ONLY)  Result Date: 06/14/2021 Pelvic US Uterus absent. Left ovary 1.94 x 1.14 cm. Right ovary 2.15 x 1.32 x 1.01 cm. No free fluid. No adnexal masses. Impression:  Normal postmenopausal ovaries.  Uterus surgically absent.     Additional studies/ records that were reviewed today include:  Reviewed the records from [facility] emergency room.   ASSESSMENT:    1. Primary hypertension   2. Mild  hyperlipidemia   3. History of migraine headaches   4. Hypothyroidism, unspecified type       PLAN:  Ms. Vernie Vinciguerra is a very pleasant young appearing 70 year oldyear-old female who has a history of migraine headaches for which she had been on atenolol with significant benefit as well as a history of mild intermittent swelling for which she has taken hydrochlorothiazide.  She also has a history of mild hyperlipidemia and when I initially saw her in 2019 she was on currently on Crestor 5 mg daily.  She is on levothyroxine for hypothyroidism laboratory in December 2018 showed a LDL cholesterol at 87 with  total cholesterol 192.  When I saw her in 2019, her ECG showed small Q waves in III and poor anterior progression.  Coronary CTA revealed a calcium score of 0.  Her echo Doppler study was essentially normal.  Recently she has had issues with blood pressure elevation leading to your evaluation in [place].  Apparently at that time had mild troponin elevation which led to cardiac catheterization and follow-up echo Doppler assessment which reportedly were normal.  She was taken off atenolol.  Recently she was given a trial of amlodipine for blood pressure control but did not tolerate this and recently was given a prescription for losartan which she has not taken.  She has been eating a lot of beets and has continued to take hydrochlorothiazide 25 mg daily.  Her blood pressure today by me was stable at 124/70.  There was no edema.  She has been drinking a significant amount of fruit juice and bananas.  I have recommended she try decreasing HCTZ to just 12.5 mg and that she monitor her blood pressure and potential for swelling.  She has only been taking rosuvastatin 5 mg every other day and lipid panel in July 2022 showed an LDL cholesterol of 113.  I have suggested she try taking rosuvastatin 5 mg daily if at all possible.  She is prediabetic with hemoglobin A1c at 6.3 but laboratory in July 2020 followed by Dr. Kenton Kingfisher.  She will monitor her blood pressure and heart rate.  Her ECG today shows sinus bradycardia at 54 bpm off beta-blocker therapy.  As long as she remains stable I will see her in 1 year for reevaluation or sooner as needed.   Medication Adjustments/Labs and Tests Ordered: Current medicines are reviewed at length with the patient today.  Concerns regarding medicines are outlined above.  Medication changes, Labs and Tests ordered today are listed in the Patient Instructions below. Patient Instructions  Medication Instructions:  DECREASE HCTZ TO 12. 5 MG EVERY DAY. KEEP A RECORD OF YOUR BLOOD PRESSURE.  IF IT STARTS TO ELEVATE, CALL THE CLINIC. TAKE ROSUVASTATIN 5 MG EVERY DAY. *If you need a refill on your cardiac medications before your next appointment, please call your pharmacy*  Follow-Up: At [facility], you and your health needs are our priority.  As part of our continuing mission to provide you with exceptional heart care, we have created designated Provider Care Teams.  These Care Teams include your primary Cardiologist (physician) and Advanced Practice Providers (APPs -  Physician Assistants and Nurse Practitioners) who all work together to provide you with the care you need, when you need it.  We recommend signing up for the patient portal called "MyChart".  Sign up information is provided on this After Visit Summary.  MyChart is used to connect with patients for Virtual Visits (Telemedicine).  Patients are able to view lab/test results,  encounter notes, upcoming appointments, etc.  Non-urgent messages can be sent to your provider as well.   To learn more about what you can do with MyChart, go to [email or web address].    Your next appointment:   12 MONTHS  The format for your next appointment:   In Person  Provider:   Shelva Majestic, MD      Signed, Shelva Majestic, MD  07/01/2021 7:16 PM    [facility] [address] [place], [place], [place]  [address] Phone: [phone]

## 2021-06-26 NOTE — Telephone Encounter (Signed)
Contacted patient to let her know that her rosuvastatin 5 mg is to be taken every day.

## 2021-07-01 ENCOUNTER — Encounter: Payer: Self-pay | Admitting: Cardiovascular Disease

## 2021-07-09 DIAGNOSIS — J069 Acute upper respiratory infection, unspecified: Secondary | ICD-10-CM | POA: Diagnosis not present

## 2021-07-09 DIAGNOSIS — Z20822 Contact with and (suspected) exposure to covid-19: Secondary | ICD-10-CM | POA: Diagnosis not present

## 2021-07-09 DIAGNOSIS — R058 Other specified cough: Secondary | ICD-10-CM | POA: Diagnosis not present

## 2021-10-02 DIAGNOSIS — R600 Localized edema: Secondary | ICD-10-CM | POA: Diagnosis not present

## 2021-10-02 DIAGNOSIS — N644 Mastodynia: Secondary | ICD-10-CM | POA: Diagnosis not present

## 2021-10-02 DIAGNOSIS — Z0000 Encounter for general adult medical examination without abnormal findings: Secondary | ICD-10-CM | POA: Diagnosis not present

## 2021-10-02 DIAGNOSIS — I10 Essential (primary) hypertension: Secondary | ICD-10-CM | POA: Diagnosis not present

## 2021-10-02 DIAGNOSIS — K219 Gastro-esophageal reflux disease without esophagitis: Secondary | ICD-10-CM | POA: Diagnosis not present

## 2021-10-02 DIAGNOSIS — E039 Hypothyroidism, unspecified: Secondary | ICD-10-CM | POA: Diagnosis not present

## 2021-10-02 DIAGNOSIS — E119 Type 2 diabetes mellitus without complications: Secondary | ICD-10-CM | POA: Diagnosis not present

## 2021-10-02 DIAGNOSIS — E7800 Pure hypercholesterolemia, unspecified: Secondary | ICD-10-CM | POA: Diagnosis not present

## 2021-10-16 DIAGNOSIS — M25551 Pain in right hip: Secondary | ICD-10-CM | POA: Diagnosis not present

## 2021-10-16 DIAGNOSIS — M25562 Pain in left knee: Secondary | ICD-10-CM | POA: Diagnosis not present

## 2021-10-24 DIAGNOSIS — M25562 Pain in left knee: Secondary | ICD-10-CM | POA: Diagnosis not present

## 2021-11-06 DIAGNOSIS — N644 Mastodynia: Secondary | ICD-10-CM | POA: Diagnosis not present

## 2021-12-04 DIAGNOSIS — M25562 Pain in left knee: Secondary | ICD-10-CM | POA: Diagnosis not present

## 2021-12-04 DIAGNOSIS — U071 COVID-19: Secondary | ICD-10-CM | POA: Diagnosis not present

## 2021-12-14 DIAGNOSIS — M25562 Pain in left knee: Secondary | ICD-10-CM | POA: Diagnosis not present

## 2021-12-19 DIAGNOSIS — M25562 Pain in left knee: Secondary | ICD-10-CM | POA: Diagnosis not present

## 2022-01-30 DIAGNOSIS — H5203 Hypermetropia, bilateral: Secondary | ICD-10-CM | POA: Diagnosis not present

## 2022-01-30 DIAGNOSIS — H43813 Vitreous degeneration, bilateral: Secondary | ICD-10-CM | POA: Diagnosis not present

## 2022-01-30 DIAGNOSIS — H04123 Dry eye syndrome of bilateral lacrimal glands: Secondary | ICD-10-CM | POA: Diagnosis not present

## 2022-02-14 DIAGNOSIS — W57XXXA Bitten or stung by nonvenomous insect and other nonvenomous arthropods, initial encounter: Secondary | ICD-10-CM | POA: Diagnosis not present

## 2022-02-14 DIAGNOSIS — S90861A Insect bite (nonvenomous), right foot, initial encounter: Secondary | ICD-10-CM | POA: Diagnosis not present

## 2022-02-14 DIAGNOSIS — L089 Local infection of the skin and subcutaneous tissue, unspecified: Secondary | ICD-10-CM | POA: Diagnosis not present

## 2022-02-28 DIAGNOSIS — H16223 Keratoconjunctivitis sicca, not specified as Sjogren's, bilateral: Secondary | ICD-10-CM | POA: Diagnosis not present

## 2022-02-28 DIAGNOSIS — H11001 Unspecified pterygium of right eye: Secondary | ICD-10-CM | POA: Diagnosis not present

## 2022-03-01 DIAGNOSIS — R519 Headache, unspecified: Secondary | ICD-10-CM | POA: Diagnosis not present

## 2022-04-01 DIAGNOSIS — I10 Essential (primary) hypertension: Secondary | ICD-10-CM | POA: Diagnosis not present

## 2022-04-01 DIAGNOSIS — N182 Chronic kidney disease, stage 2 (mild): Secondary | ICD-10-CM | POA: Diagnosis not present

## 2022-04-01 DIAGNOSIS — E7800 Pure hypercholesterolemia, unspecified: Secondary | ICD-10-CM | POA: Diagnosis not present

## 2022-04-01 DIAGNOSIS — E119 Type 2 diabetes mellitus without complications: Secondary | ICD-10-CM | POA: Diagnosis not present

## 2022-04-01 DIAGNOSIS — E039 Hypothyroidism, unspecified: Secondary | ICD-10-CM | POA: Diagnosis not present

## 2022-05-31 DIAGNOSIS — J40 Bronchitis, not specified as acute or chronic: Secondary | ICD-10-CM | POA: Diagnosis not present

## 2022-05-31 DIAGNOSIS — J069 Acute upper respiratory infection, unspecified: Secondary | ICD-10-CM | POA: Diagnosis not present

## 2022-05-31 DIAGNOSIS — J029 Acute pharyngitis, unspecified: Secondary | ICD-10-CM | POA: Diagnosis not present

## 2022-05-31 DIAGNOSIS — R051 Acute cough: Secondary | ICD-10-CM | POA: Diagnosis not present

## 2022-05-31 DIAGNOSIS — Z03818 Encounter for observation for suspected exposure to other biological agents ruled out: Secondary | ICD-10-CM | POA: Diagnosis not present

## 2022-06-20 ENCOUNTER — Encounter: Payer: Self-pay | Admitting: Cardiovascular Disease

## 2022-06-20 ENCOUNTER — Ambulatory Visit: Payer: Medicare Other | Attending: Cardiovascular Disease | Admitting: Cardiovascular Disease

## 2022-06-20 VITALS — BP 132/78 | HR 65 | Ht 67.0 in | Wt 131.2 lb

## 2022-06-20 DIAGNOSIS — E039 Hypothyroidism, unspecified: Secondary | ICD-10-CM | POA: Diagnosis not present

## 2022-06-20 DIAGNOSIS — I10 Essential (primary) hypertension: Secondary | ICD-10-CM | POA: Diagnosis not present

## 2022-06-20 DIAGNOSIS — E785 Hyperlipidemia, unspecified: Secondary | ICD-10-CM | POA: Diagnosis not present

## 2022-06-20 DIAGNOSIS — Z8669 Personal history of other diseases of the nervous system and sense organs: Secondary | ICD-10-CM | POA: Diagnosis not present

## 2022-06-20 NOTE — Patient Instructions (Signed)
Medication Instructions:  Your physician recommends that you continue on your current medications as directed. Please refer to the Current Medication list given to you today.  *If you need a refill on your cardiac medications before your next appointment, please call your pharmacy*  Follow-Up: At [facility], you and your health needs are our priority.  As part of our continuing mission to provide you with exceptional heart care, we have created designated Provider Care Teams.  These Care Teams include your primary Cardiologist (physician) and Advanced Practice Providers (APPs -  Physician Assistants and Nurse Practitioners) who all work together to provide you with the care you need, when you need it.  We recommend signing up for the patient portal called "MyChart".  Sign up information is provided on this After Visit Summary.  MyChart is used to connect with patients for Virtual Visits (Telemedicine).  Patients are able to view lab/test results, encounter notes, upcoming appointments, etc.  Non-urgent messages can be sent to your provider as well.   To learn more about what you can do with MyChart, go to [email or web address].    Your next appointment:   12 month(s)  The format for your next appointment:   In Person  Provider:   Dr. Claiborne Billings

## 2022-06-20 NOTE — Progress Notes (Signed)
Cardiology Office Note    Date:  06/22/2022   ID:  Sandra Ellis, DOB [birth year], MRN [id number]  PCP:  Shirline Frees, MD  Cardiologist:  Shelva Majestic, MD   One year F/U cardiology evaluation initially referred by [facility] emergency room and Dr.Harris for abnormal ECG in June 2019.   History of Present Illness:  Sandra Ellis is a 76 y.o. female who was initiatally referred by [facility] emergency room after the patient was evaluated by Dr.Charles Graeub.  She was referred by Dr. Kenton Kingfisher at [facility] following her presentation to the emergency room while in [place] in August 2022 with significant blood pressure elevation.  Sandra Ellis is followed by Dr. Shirline Frees for primary care.  She has a remote history of migraine headaches and was evaluated at the [facility] years ago and was placed on atenolol with significant benefit and resolution of her migraine headaches.  She has a history of occasional swelling for which she has taken HCTZ.  She has been on rosuvastatin 5 mg every other day for mild hyperlipidemia.  She has a history of spastic colon and also a history of hypothyroidism.  On February 07, 2018 she was evaluated at [facility] emergency room with complaints of cough, pleuritic chest pain, and shortness of breath.  He states that there was an occasion where she woke up and it was difficult to breathe and she had experienced associated sweating during this episode 2 weeks previously.  Previously she was diagnosed with bronchitis and had been treated with Z-Pak with some improvement but her cough had recurred.  Initially, 2 years ago she also had an episode of bronc Nilda Calamity that was hard to resolve.  During her evaluation in the emergency room, her ECG is unchanged from her prior ECG in January 2019 and suggested inferior Q waves and poor anterior R wave progression.  Her heart score was 3.  A CT of her chest did not show acute cardiopulmonary disease.  Small  liver hypodensities were noted, too small to characterize were felt most likely cysts.  Chest x-ray was unremarkable but suggested prior granulomatous disease.  It was recommended that the patient have an echocardiogram.  When I initially evaluated her she admitted to mild vague chest pressure which is not characteristically exertional.  I recommended she undergo an echo Doppler study as well as a coronary CTA to assess for aortic as well as coronary atherosclerosis.  Her echo Doppler study done on February 23, 2018 showed an EF of 60 to 65% with normal wall motion.  There was mild aortic regurgitation and trivial tricuspid regurgitation.  She underwent coronary CTA on April 02, 2018 which showed a calcium score of 0.  Her aortic root was normal at 3.5 cm.  She was not found to have any significant noncardiac abnormality.  I last saw her on June 25, 2021.  Since her prior evaluation with me she was followed by Dr. Kenton Kingfisher and had issues with hypertension. She presented to the emergency room while in [place] where her blood pressure was elevated around 200/100 and her pulse was low.  Troponin I initially was 4.1 and decreased to 2.1.  Apparently she was admitted for 2 days and underwent a cardiac catheterization and echocardiogram which were normal.  At the time she was on atenolol and this was discontinued due to bradycardia.  When she saw Dr. Shirline Frees at [facility] on May 09, 2021 he recommended the addition of amlodipine to  her medical regimen.  Apparently in the past she had been treated with a atenolol which improved her migraine headaches.  She did not tolerate the amlodipine and subsequently was given a prescription for losartan but never started this.  She has remained active and swims 5 days/week for at least 20 minutes, goes to the gym and often walks on a treadmill at home.  Recently she was drinking 90 ounces of fruit juice per day and eating bananas.  She has been eating beets.  She was  taking rosuvastatin 5 mg every other day and was on levothyroxine 75 mcg.  She denies any chest pain.  During that evaluation her blood pressure was stable at 124/70.  I recommended she decrease HCTZ to just 12.5 mg and that she monitor her blood pressure.  I also suggested she try taking rosuvastatin 5 mg daily if at all possible.  Her ECG showed sinus bradycardia 54 off beta-blocker therapy.  Since I last saw her, she has felt well.  She states her blood pressure at home typically runs around 110/70.  She has had 10 pounds of recent purposeful weight loss and her hemoglobin A1c improved from 6.66.1.  She remains active.  She exercises at least 2 miles a day on a treadmill and goes to the gym on a daily basis.  She underwent laboratory by Dr. Kenton Kingfisher on April 01, 2022 which showed total cholesterol 193, HDL 84, triglycerides 72, and LDL 96.  She was off rosuvastatin.  She presents for follow-up evaluation.  Past Medical History:  Diagnosis Date   COVID-19 virus detected 09/07/2018   Elevated cholesterol    H/O blood clots 1990   Hx of colonic polyp    Hypothyroidism    Pulmonary emboli [facility])     Past Surgical History:  Procedure Laterality Date   COLONOSCOPY  11/09/2014   Colonic polyp status polypectomy. Small internal hemorrhoids. Tubular adenoma.   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   Procedure: LEFT TOTAL HIP ARTHROPLASTY ANTERIOR APPROACH;  Surgeon: Paralee Cancel, MD;  Location: [facility] ORS;  Service: Orthopedics;  Laterality: Left;   VAGINAL HYSTERECTOMY     TVH    Current Medications: Outpatient Medications Prior to Visit  Medication Sig Dispense Refill   Ascorbic Acid (VITAMIN C PO) Take 1 tablet by mouth daily.     BIOTIN PO Take 1 tablet by mouth daily in the afternoon.     Cholecalciferol (VITAMIN D3 PO) Take 5,000 Units by mouth daily. Every other day     CINNAMON PO Take 2 tablets by mouth daily in the  afternoon.     Coenzyme Q10 (CO Q-10) 300 MG CAPS Take 300 mg by mouth daily.     HAWTHORN BERRY PO Take 2 tablets by mouth daily in the afternoon.     hydrochlorothiazide (HYDRODIURIL) 12.5 MG tablet Take 1 tablet (12.5 mg total) by mouth daily. 90 tablet 3   levothyroxine (SYNTHROID, LEVOTHROID) 75 MCG tablet Take 75 mcg by mouth daily before breakfast.      Magnesium 250 MG TABS Take 250 mg by mouth daily.      Multiple Vitamins-Minerals (HAIR SKIN AND NAILS FORMULA PO) Take 1 tablet by mouth daily.     Multiple Vitamins-Minerals (ZINC PO) Take 1 tablet by mouth daily.     NON FORMULARY Take 1 tablet by mouth daily in the afternoon. Beet  Omega-3 Fatty Acids (FISH OIL) 1200 MG CAPS Take 2 capsules by mouth daily.      Potassium (POTASSIMIN PO) Take 1 tablet by mouth daily.     Probiotic Product (PROBIOTIC PO) Take 1 tablet by mouth daily as needed.      rosuvastatin (CRESTOR) 5 MG tablet Take 1 tablet (5 mg total) by mouth daily. 90 tablet 3   tretinoin (RETIN-A) 0.1 % cream Apply 1 application topically at bedtime. APPLIED TO FACE     fluorouracil (EFUDEX) 5 % cream 1 APPLICATION APPLY ON THE SKIN AT BEDTIME APPLY TO AFFECTED AREA NIGHTLY FOR 2 WEEKS (Patient not taking: Reported on 06/20/2022)     L-ARGININE PO Take 1 tablet by mouth daily in the afternoon. (Patient not taking: Reported on 06/20/2022)     No facility-administered medications prior to visit.     Allergies:   Nitrofuran derivatives, Atorvastatin, Levaquin [levofloxacin in d5w], Pravastatin sodium, Tramadol hcl, and Ciprofloxacin   Social History   Socioeconomic History   Marital status: Married    Spouse name: Not on file   Number of children: 3   Years of education: Not on file   Highest education level: Not on file  Occupational History   Occupation: minister  Tobacco Use   Smoking status: Former    Packs/day: 0.50    Years: 8.00    Total pack years: 4.00    Types: Cigarettes    Quit date: 09/02/1973     Years since quitting: 48.8   Smokeless tobacco: Never  Vaping Use   Vaping Use: Never used  Substance and Sexual Activity   Alcohol use: No    Alcohol/week: 0.0 standard drinks of alcohol   Drug use: No   Sexual activity: Yes    Birth control/protection: Surgical    Comment: FIRST INTERCORSE AGE 71- ONE SEXUAL PARTNER   Other Topics Concern   Not on file  Social History Narrative   Not on file   Social Determinants of Health   Financial Resource Strain: Not on file  Food Insecurity: Not on file  Transportation Needs: Not on file  Physical Activity: Not on file  Stress: Not on file  Social Connections: Not on file    She has been married for 17 years and has 3 children, 13 grandchildren and 1 great grandchild.  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  There is no alcohol or tobacco use.  She exercises at least 5 days/week either swimming, going to the gym or treadmill for typically 1 to 2 hours at a time.  Hypertension is present in 2 of her children 1 with POTS disease.  Family History:  The patient's family history includes Asthma in her mother; Diabetes in her mother.   Both parents are deceased, mother at age 21 and father at age 15.  She has 3 brothers and 1 sister.  ROS General: Negative; No fevers, chills, or night sweats;  HEENT: Negative; No changes in vision or hearing, sinus congestion, difficulty swallowing Pulmonary: Negative; No cough, wheezing, shortness of breath, hemoptysis Cardiovascular: Negative; No chest pain, presyncope, syncope, palpitations GI: Negative; No nausea, vomiting, diarrhea, or abdominal pain GU: She had prior kidney stone Musculoskeletal: Negative; no myalgias, joint pain, or weakness Hematologic/Oncology: Negative; no easy bruising, bleeding Endocrine: Negative; no heat/cold intolerance; no diabetes Neuro: Negative; no changes in balance, headaches Skin: Negative; No rashes or skin lesions Psychiatric: Negative; No behavioral  problems, depression Sleep: Negative; No snoring, daytime sleepiness, hypersomnolence,  bruxism, restless legs, hypnogognic hallucinations, no cataplexy An Epworth  sleepiness scale score was calculated in the office today and this endorsed at 3 arguing against daytime sleepiness. Other comprehensive 14 point system review is negative.   PHYSICAL EXAM:   VS:  BP 132/78   Pulse 65   Ht 5' 7"  (1.702 m)   Wt 131 lb 3.2 oz (59.5 kg)   SpO2 98%   BMI 20.55 kg/m     Repeat blood pressure by me was 134/78  Wt Readings from Last 3 Encounters:  06/20/22 131 lb 3.2 oz (59.5 kg)  06/25/21 142 lb 6.4 oz (64.6 kg)  06/14/21 143 lb (64.9 kg)    General: Alert, oriented, no distress.  Skin: normal turgor, no rashes, warm and dry HEENT: Normocephalic, atraumatic. Pupils equal round and reactive to light; sclera anicteric; extraocular muscles intact;  Nose without nasal septal hypertrophy Mouth/Parynx benign; Mallinpatti scale 3 Neck: No JVD, no carotid bruits; normal carotid upstroke Lungs: clear to ausculatation and percussion; no wheezing or rales Chest wall: without tenderness to palpitation Heart: PMI not displaced, RRR, s1 s2 normal, 1/6 systolic murmur, no diastolic murmur, no rubs, gallops, thrills, or heaves Abdomen: soft, nontender; no hepatosplenomehaly, BS+; abdominal aorta nontender and not dilated by palpation. Back: no CVA tenderness Pulses 2+ Musculoskeletal: full range of motion, normal strength, no joint deformities Extremities: no clubbing cyanosis or edema, Homan's sign negative  Neurologic: grossly nonfocal; Cranial nerves grossly wnl Psychologic: Normal mood and affect    Studies/Labs Reviewed:   June 20, 2022 ECG (independently read by me): NSR at 65, Left axis deviation,Q in III  June 25, 2021 ECG (independently read by me): Sinus bradycardia at 54, Left axis deviation  February 19 2018 ECG (independently read by me): Sinus bradycardia at 51 bpm.  Q waves in lead  III.  Poor anterior R wave progression.  Normal intervals.  No ectopy.  Recent Labs:    Latest Ref Rng & Units 12/14/2020    6:47 PM 04/02/2018    1:28 PM 01/23/2017    1:23 PM  BMP  Glucose 70 - 99 mg/dL 101   105   BUN 8 - 23 mg/dL 21   10   Creatinine 0.44 - 1.00 mg/dL 0.79  0.80  0.77   Sodium 135 - 145 mmol/L 139   128   Potassium 3.5 - 5.1 mmol/L 3.9   3.8   Chloride 98 - 111 mmol/L 105   94   CO2 22 - 32 mmol/L 25   26   Calcium 8.9 - 10.3 mg/dL 9.6   9.5         Latest Ref Rng & Units 12/14/2020    6:47 PM 01/23/2017    1:23 PM 02/22/2016    3:03 PM  Hepatic Function  Total Protein 6.5 - 8.1 g/dL 7.3  6.7  7.0   Albumin 3.5 - 5.0 g/dL 4.2  3.9  4.3   AST 15 - 41 U/L 26  21  23    ALT 0 - 44 U/L 26  18  34   Alk Phosphatase 38 - 126 U/L 73  88  55   Total Bilirubin 0.3 - 1.2 mg/dL 0.3  0.6  0.4   Bilirubin, Direct 0.0 - 0.3 mg/dL   0.0        Latest Ref Rng & Units 12/14/2020    6:47 PM 03/18/2018   11:55 AM 01/23/2017    1:23 PM  CBC  WBC 4.0 -  10.5 K/uL 7.5  12.6  9.5   Hemoglobin 12.0 - 15.0 g/dL 13.9  14.4  12.8   Hematocrit 36.0 - 46.0 % 42.5  43.2  37.4   Platelets 150 - 400 K/uL 265  336.0  416    Lab Results  Component Value Date   MCV 95.5 12/14/2020   MCV 92.9 03/18/2018   MCV 90.6 01/23/2017   Lab Results  Component Value Date   No results found for: "HGBA1C"   BNP No results found for: "BNP"  ProBNP No results found for: "PROBNP"   Lipid Panel  No results found for: "CHOL", "TRIG", "HDL", "CHOLHDL", "VLDL", "LDLCALC", "LDLDIRECT"   RADIOLOGY: No results found.   Additional studies/ records that were reviewed today include:  Reviewed the records from [facility] emergency room.   ASSESSMENT:    1. Primary hypertension   2. History of mild hyperlipidemia   3. Hypothyroidism, unspecified type   4. History of migraine headaches     PLAN:  Sandra Ellis is a very pleasant young appearing 76 year old female who has a  history of migraine headaches for which she had been on atenolol with significant benefit as well as a history of mild intermittent swelling for which she had taken hydrochlorothiazide.  She also has a history of mild hyperlipidemia and when I initially saw her in 2019 she was on Crestor 5 mg daily.  She has hypothyroidism and has been on levothyroxine.  Laboratory in December 2018 showed a LDL cholesterol at 87 with total cholesterol 192.  When I saw her in 2019, her ECG showed small Q waves in III and poor anterior progression.  Coronary CTA revealed a calcium score of 0.  Her echo Doppler study was essentially normal.  At her last evaluation with me in 2022 she had had some issues with blood pressure elevation while in [place].  Due to mild troponin elevation she underwent cardiac catheterization at [facility] in [place] on April 17, 2021 and follow-up echo Doppler assessment which were normal and she was taken off atenolol.  She has had significant dietary adjustment.  Presently her blood pressure at home has remained stable and by her report typically is around 110/70.  She has had 10 pounds of recent purposeful weight loss.  She exercises every day typically walks 2 miles on a treadmill and goes to the gym.  She had stopped taking rosuvastatin.  Subsequent laboratory on April 01, 2022 showed total cholesterol 193 HDL 84 triglycerides 72 and LDL 96.  Her blood pressure today remains stable.  She continues to be on low-dose HCTZ for blood pressure and swelling and continues to be on levothyroxine 75 mcg for hypothyroidism.  I recommended continued exercise and heart healthy diet.  She will follow-up with Dr. Kenton Kingfisher.  I will see her in 1 year for reevaluation or sooner as needed.   Medication Adjustments/Labs and Tests Ordered: Current medicines are reviewed at length with the patient today.  Concerns regarding medicines are outlined above.  Medication changes, Labs and Tests ordered today are  listed in the Patient Instructions below. Patient Instructions  Medication Instructions:  Your physician recommends that you continue on your current medications as directed. Please refer to the Current Medication list given to you today.  *If you need a refill on your cardiac medications before your next appointment, please call your pharmacy*  Follow-Up: At [facility], you and your health needs are our priority.  As part of our continuing  mission to provide you with exceptional heart care, we have created designated Provider Care Teams.  These Care Teams include your primary Cardiologist (physician) and Advanced Practice Providers (APPs -  Physician Assistants and Nurse Practitioners) who all work together to provide you with the care you need, when you need it.  We recommend signing up for the patient portal called "MyChart".  Sign up information is provided on this After Visit Summary.  MyChart is used to connect with patients for Virtual Visits (Telemedicine).  Patients are able to view lab/test results, encounter notes, upcoming appointments, etc.  Non-urgent messages can be sent to your provider as well.   To learn more about what you can do with MyChart, go to [email or web address].    Your next appointment:   12 month(s)  The format for your next appointment:   In Person  Provider:   Dr. Claiborne Billings       Signed, Shelva Majestic, MD  06/22/2022 11:21 AM    [facility] [address], [place], [place], [place]  [address] Phone: [phone]

## 2022-06-22 ENCOUNTER — Encounter: Payer: Self-pay | Admitting: Cardiovascular Disease

## 2022-06-25 ENCOUNTER — Encounter: Payer: Self-pay | Admitting: Cardiovascular Disease

## 2022-06-28 ENCOUNTER — Telehealth: Payer: Self-pay | Admitting: Cardiovascular Disease

## 2022-06-28 NOTE — Telephone Encounter (Signed)
Forwarding to [facility] Triage

## 2022-06-28 NOTE — Telephone Encounter (Signed)
Patient stated that on Wednesday, she felt a tight band sensation under both breasts but not her back. She went to sleep for 36 hours. When she woke, the tightness was gone. She did report headache and slight nausea, There are no rashes or sores under breasts. She thought she may have had an MI, but never went to the ED. Recommended that if she experiences any chest discomfort, to go to the ED. She also wanted to know the results of her last EKG. She is asymptomatic at this time. She is not at home and cannot provide VS. She wanted to come in for another EKG to see if she had an MI. Explained that an order is needed for her to come to the clinic. She said she would call her PCP.

## 2022-06-28 NOTE — Telephone Encounter (Signed)
Pt c/o of Chest Pain: STAT if CP now or developed within 24 hours  1. Are you having CP right now? No  2. Are you experiencing any other symptoms (ex. SOB, nausea, vomiting, sweating)?   No  3. How long have you been experiencing CP?   Since Wednesday but has stopped   4. Is your CP continuous or coming and going? Tightness was continuous  5. Have you taken Nitroglycerin?   Does not take  Patient stated she felt a tight band around her chest which stayed for most of the day on Wednesday.  Patient stated she slept all day yesterday and all night but is concerned it could possibly have been a heart attack.?

## 2022-07-02 ENCOUNTER — Encounter: Payer: Self-pay | Admitting: Cardiovascular Disease

## 2022-07-03 DIAGNOSIS — I10 Essential (primary) hypertension: Secondary | ICD-10-CM | POA: Diagnosis not present

## 2022-07-03 DIAGNOSIS — R079 Chest pain, unspecified: Secondary | ICD-10-CM | POA: Diagnosis not present

## 2022-07-10 ENCOUNTER — Other Ambulatory Visit: Payer: Self-pay

## 2022-07-16 DIAGNOSIS — Z20822 Contact with and (suspected) exposure to covid-19: Secondary | ICD-10-CM | POA: Diagnosis not present

## 2022-07-16 DIAGNOSIS — J069 Acute upper respiratory infection, unspecified: Secondary | ICD-10-CM | POA: Diagnosis not present

## 2022-07-16 DIAGNOSIS — R059 Cough, unspecified: Secondary | ICD-10-CM | POA: Diagnosis not present

## 2022-07-19 DIAGNOSIS — R051 Acute cough: Secondary | ICD-10-CM | POA: Diagnosis not present

## 2022-07-19 DIAGNOSIS — R300 Dysuria: Secondary | ICD-10-CM | POA: Diagnosis not present

## 2022-08-08 DIAGNOSIS — L820 Inflamed seborrheic keratosis: Secondary | ICD-10-CM | POA: Diagnosis not present

## 2022-08-08 DIAGNOSIS — L245 Irritant contact dermatitis due to other chemical products: Secondary | ICD-10-CM | POA: Diagnosis not present

## 2022-09-19 DIAGNOSIS — L03031 Cellulitis of right toe: Secondary | ICD-10-CM | POA: Diagnosis not present

## 2022-09-23 DIAGNOSIS — S90861A Insect bite (nonvenomous), right foot, initial encounter: Secondary | ICD-10-CM | POA: Diagnosis not present

## 2022-09-23 DIAGNOSIS — M79671 Pain in right foot: Secondary | ICD-10-CM | POA: Diagnosis not present

## 2022-09-23 DIAGNOSIS — L600 Ingrowing nail: Secondary | ICD-10-CM | POA: Diagnosis not present

## 2022-09-23 DIAGNOSIS — Z681 Body mass index (BMI) 19 or less, adult: Secondary | ICD-10-CM | POA: Diagnosis not present

## 2022-09-23 DIAGNOSIS — W57XXXA Bitten or stung by nonvenomous insect and other nonvenomous arthropods, initial encounter: Secondary | ICD-10-CM | POA: Diagnosis not present

## 2022-09-30 DIAGNOSIS — R7303 Prediabetes: Secondary | ICD-10-CM | POA: Diagnosis not present

## 2022-09-30 DIAGNOSIS — M79674 Pain in right toe(s): Secondary | ICD-10-CM | POA: Diagnosis not present

## 2022-10-03 ENCOUNTER — Ambulatory Visit (INDEPENDENT_AMBULATORY_CARE_PROVIDER_SITE_OTHER): Payer: Medicare Other | Admitting: Podiatry

## 2022-10-03 ENCOUNTER — Encounter: Payer: Self-pay | Admitting: Podiatry

## 2022-10-03 DIAGNOSIS — L03032 Cellulitis of left toe: Secondary | ICD-10-CM | POA: Diagnosis not present

## 2022-10-03 NOTE — Progress Notes (Signed)
She presents today concerned about her left hallux on the lateral border she states that she had an infection a few weeks ago went to urgent care in [place] and they took a small piece of nail out and put her on an antibiotic.  She states that she wanted it checked because she has a diabetes.  She also had a bite to her fourth toe on her right foot which she saw her PCP for but now it seems to have gotten better.  She goes on to say that she had a flareup of some type of inflammatory process around the first metatarsophalangeal joint but her primary care provider stated that it was not gout based on the blood work.  But he did go away in 2 to 3 days.  Objective: Vital signs stable alert and oriented x 3 there is no erythema edema salines drainage odor appears to have been a spicule that grew out the end of the toenail on the left foot the fourth toe of the right foot appears to be healing very nicely no signs of infection.  Assessment: Well-healing bug bite to the fourth digit right foot.  Well-healing paronychia hallux left.  I encouraged her to come by the office immediately to see any of [place] the next time she gets a flareup that first metatarsal phalangeal joint she will need blood work consisting of arthritic profile and a complete metabolic panel and a CBC.

## 2022-10-10 ENCOUNTER — Ambulatory Visit (INDEPENDENT_AMBULATORY_CARE_PROVIDER_SITE_OTHER): Payer: Medicare Other

## 2022-10-10 ENCOUNTER — Ambulatory Visit
Admission: EM | Admit: 2022-10-10 | Discharge: 2022-10-10 | Disposition: A | Discharge status: Home / Self Care | Payer: Medicare Other | Attending: Urgent Care | Admitting: Urgent Care

## 2022-10-10 DIAGNOSIS — J209 Acute bronchitis, unspecified: Secondary | ICD-10-CM

## 2022-10-10 DIAGNOSIS — R0789 Other chest pain: Secondary | ICD-10-CM

## 2022-10-10 DIAGNOSIS — R059 Cough, unspecified: Secondary | ICD-10-CM

## 2022-10-10 MED ORDER — CETIRIZINE HCL 10 MG PO TABS: 10.0000 mg | ORAL_TABLET | Freq: Every day | ORAL | 0 refills | Status: DC

## 2022-10-10 MED ORDER — BENZONATATE 100 MG PO CAPS: 100.0000 mg | ORAL_CAPSULE | Freq: Three times a day (TID) | ORAL | 0 refills | Status: DC | PRN

## 2022-10-10 MED ORDER — PSEUDOEPHEDRINE HCL 60 MG PO TABS: 60.0000 mg | ORAL_TABLET | Freq: Three times a day (TID) | ORAL | 0 refills | Status: DC | PRN

## 2022-10-10 MED ORDER — PROMETHAZINE-DM 6.25-15 MG/5ML PO SYRP: 5.0000 mL | ORAL_SOLUTION | Freq: Three times a day (TID) | ORAL | 0 refills | Status: DC | PRN

## 2022-10-10 NOTE — ED Triage Notes (Signed)
Pt c/o prod cough x 3 days-NAD-steady gait

## 2022-10-10 NOTE — ED Provider Notes (Signed)
[facility]  Note:  This document was prepared using Systems analyst and may include unintentional dictation errors.  MRN: [id number] DOB: [birth year]  Subjective:   Sandra Ellis is a 77 y.o. female presenting for 3 day history of acute onset productive cough that elicits left sided upper back pain, epigastric pain with her coughing. Has 2 grand-daughters that are being treated for pneumonia.  Would like to make sure that she does not have pneumonia.  Has had some hoarseness of her voice.  No throat pain, ear pain, sinus pain.  No history of asthma.  Patient is not a smoker.  No current facility-administered medications for this encounter.  Current Outpatient Medications:    Ascorbic Acid (VITAMIN C PO), Take 1 tablet by mouth daily., Disp: , Rfl:    BIOTIN PO, Take 1 tablet by mouth daily in the afternoon., Disp: , Rfl:    Cholecalciferol (VITAMIN D3 PO), Take 5,000 Units by mouth daily. Every other day, Disp: , Rfl:    CINNAMON PO, Take 2 tablets by mouth daily in the afternoon., Disp: , Rfl:    Coenzyme Q10 (CO Q-10) 300 MG CAPS, Take 300 mg by mouth daily., Disp: , Rfl:    HAWTHORN BERRY PO, Take 2 tablets by mouth daily in the afternoon., Disp: , Rfl:    hydrochlorothiazide (HYDRODIURIL) 12.5 MG tablet, Take 1 tablet (12.5 mg total) by mouth daily., Disp: 90 tablet, Rfl: 3   levothyroxine (SYNTHROID, LEVOTHROID) 75 MCG tablet, Take 75 mcg by mouth daily before breakfast. , Disp: , Rfl:    Magnesium 250 MG TABS, Take 250 mg by mouth daily. , Disp: , Rfl:    Multiple Vitamins-Minerals (HAIR SKIN AND NAILS FORMULA PO), Take 1 tablet by mouth daily., Disp: , Rfl:    Multiple Vitamins-Minerals (ZINC PO), Take 1 tablet by mouth daily., Disp: , Rfl:    mupirocin ointment (BACTROBAN) 2 %, Apply topically 3 (three) times daily., Disp: , Rfl:    NON FORMULARY, Take 1 tablet by mouth daily in the afternoon. Beet, Disp: , Rfl:    Omega-3 Fatty Acids  (FISH OIL) 1200 MG CAPS, Take 2 capsules by mouth daily. , Disp: , Rfl:    Potassium (POTASSIMIN PO), Take 1 tablet by mouth daily., Disp: , Rfl:    Probiotic Product (PROBIOTIC PO), Take 1 tablet by mouth daily as needed. , Disp: , Rfl:    rosuvastatin (CRESTOR) 5 MG tablet, Take 1 tablet (5 mg total) by mouth daily., Disp: 90 tablet, Rfl: 3   tretinoin (RETIN-A) 0.1 % cream, Apply 1 application topically at bedtime. APPLIED TO FACE, Disp: , Rfl:    Allergies  Allergen Reactions   Nitrofuran Derivatives Shortness Of Breath and Rash   Atorvastatin     Other reaction(s): muscle aches   Levaquin [Levofloxacin In D5w] Other (See Comments)    NUMBNESS/REDNESS   Pravastatin Sodium Other (See Comments)   Tramadol Hcl     Other reaction(s): GI upset   Ciprofloxacin Rash and Other (See Comments)    NUMBNESS/REDNESS/RASH    Past Medical History:  Diagnosis Date   COVID-19 virus detected 09/07/2018   Elevated cholesterol    H/O blood clots 1990   Hx of colonic polyp    Hypothyroidism    Pulmonary emboli [facility])      Past Surgical History:  Procedure Laterality Date   COLONOSCOPY  11/09/2014   Colonic polyp status polypectomy. Small internal hemorrhoids. Tubular adenoma.   KNEE  SURGERY     cyst removed   POLYPECTOMY     TONSILLECTOMY     TOTAL HIP ARTHROPLASTY Left 12/31/2016   Procedure: LEFT TOTAL HIP ARTHROPLASTY ANTERIOR APPROACH;  Surgeon: Paralee Cancel, MD;  Location: [facility];  Service: Orthopedics;  Laterality: Left;   VAGINAL HYSTERECTOMY     TVH    Family History  Problem Relation Age of Onset   Diabetes Mother    Asthma Mother    Colon cancer Neg Hx    Esophageal cancer Neg Hx    Pancreatic cancer Neg Hx    Stomach cancer Neg Hx     Social History   Tobacco Use   Smoking status: Former    Packs/day: 0.50    Years: 8.00    Total pack years: 4.00    Types: Cigarettes    Quit date: 09/02/1973    Years since quitting: 49.1   Smokeless tobacco: Never  Vaping Use    Vaping Use: Never used  Substance Use Topics   Alcohol use: No    Alcohol/week: 0.0 standard drinks of alcohol   Drug use: No    ROS   Objective:   Vitals: BP 126/76 (BP Location: Right Arm)   Pulse 77   Temp 97.9 F (36.6 C) (Oral)   Resp 18   SpO2 95%   Physical Exam Constitutional:      General: She is not in acute distress.    Appearance: Normal appearance. She is well-developed. She is not ill-appearing, toxic-appearing or diaphoretic.  HENT:     Head: Normocephalic and atraumatic.     Nose: Nose normal.     Mouth/Throat:     Mouth: Mucous membranes are moist.     Pharynx: No pharyngeal swelling, oropharyngeal exudate, posterior oropharyngeal erythema or uvula swelling.     Tonsils: No tonsillar exudate or tonsillar abscesses. 0 on the right. 0 on the left.  Eyes:     General: No scleral icterus.       Right eye: No discharge.        Left eye: No discharge.     Extraocular Movements: Extraocular movements intact.  Cardiovascular:     Rate and Rhythm: Normal rate and regular rhythm.     Heart sounds: Normal heart sounds. No murmur heard.    No friction rub. No gallop.  Pulmonary:     Effort: Pulmonary effort is normal. No respiratory distress.     Breath sounds: No stridor. No wheezing, rhonchi or rales.  Chest:     Chest wall: No tenderness.  Skin:    General: Skin is warm and dry.  Neurological:     General: No focal deficit present.     Mental Status: She is alert and oriented to person, place, and time.  Psychiatric:        Mood and Affect: Mood normal.        Behavior: Behavior normal.     DG Chest 2 View  Result Date: 10/10/2022 CLINICAL DATA:  Cough EXAM: CHEST - 2 VIEW COMPARISON:  12/14/2020 FINDINGS: The heart size and mediastinal contours are within normal limits. Mildly hyperinflated lungs. Calcified granulomas in the right lung. No focal airspace consolidation, pleural effusion, or pneumothorax. No acute bony abnormality. IMPRESSION: No active  cardiopulmonary disease. Electronically Signed   By: Davina Poke D.O.   On: 10/10/2022 09:43     Assessment and Plan :   PDMP not reviewed this encounter.  1. Acute bronchitis, unspecified organism  2. Atypical chest pain     Recommended supportive care for acute bronchitis, patient declined prednisone due to her blood sugars.  Recommended COVID test but she declined.  Chest x-ray is otherwise negative. Counseled patient on potential for adverse effects with medications prescribed/recommended today, ER and return-to-clinic precautions discussed, patient verbalized understanding.    Jaynee Eagles, [place] 10/10/22 1039

## 2022-10-15 DIAGNOSIS — Z0000 Encounter for general adult medical examination without abnormal findings: Secondary | ICD-10-CM | POA: Diagnosis not present

## 2022-10-15 DIAGNOSIS — K219 Gastro-esophageal reflux disease without esophagitis: Secondary | ICD-10-CM | POA: Diagnosis not present

## 2022-10-15 DIAGNOSIS — R600 Localized edema: Secondary | ICD-10-CM | POA: Diagnosis not present

## 2022-10-15 DIAGNOSIS — R058 Other specified cough: Secondary | ICD-10-CM | POA: Diagnosis not present

## 2022-10-15 DIAGNOSIS — E039 Hypothyroidism, unspecified: Secondary | ICD-10-CM | POA: Diagnosis not present

## 2022-10-15 DIAGNOSIS — E7800 Pure hypercholesterolemia, unspecified: Secondary | ICD-10-CM | POA: Diagnosis not present

## 2022-10-15 DIAGNOSIS — E119 Type 2 diabetes mellitus without complications: Secondary | ICD-10-CM | POA: Diagnosis not present

## 2022-10-15 DIAGNOSIS — I10 Essential (primary) hypertension: Secondary | ICD-10-CM | POA: Diagnosis not present

## 2022-10-30 DIAGNOSIS — R1084 Generalized abdominal pain: Secondary | ICD-10-CM | POA: Diagnosis not present

## 2022-10-30 DIAGNOSIS — Z87442 Personal history of urinary calculi: Secondary | ICD-10-CM | POA: Diagnosis not present

## 2022-11-05 ENCOUNTER — Encounter: Payer: Self-pay | Admitting: Cardiovascular Disease

## 2022-11-10 NOTE — Progress Notes (Unsigned)
Cardiology Clinic Note   Patient Name: Sandra Ellis Date of Encounter: 11/12/2022  Primary Care Provider:  Shirline Frees, MD Primary Cardiologist:  None  Patient Profile    Sandra Ellis 77 year old female presents to the clinic today for follow-up evaluation of her chest discomfort.  Past Medical History    Past Medical History:  Diagnosis Date   COVID-19 virus detected 09/07/2018   Elevated cholesterol    H/O blood clots 1990   Hx of colonic polyp    Hypothyroidism    Pulmonary emboli [facility])    Past Surgical History:  Procedure Laterality Date   COLONOSCOPY  11/09/2014   Colonic polyp status polypectomy. Small internal hemorrhoids. Tubular adenoma.   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   Procedure: LEFT TOTAL HIP ARTHROPLASTY ANTERIOR APPROACH;  Surgeon: Paralee Cancel, MD;  Location: [facility];  Service: Orthopedics;  Laterality: Left;   VAGINAL HYSTERECTOMY     TVH    Allergies  Allergies  Allergen Reactions   Nitrofuran Derivatives Shortness Of Breath and Rash   Atorvastatin     Other reaction(s): muscle aches   Levaquin [Levofloxacin In D5w] Other (See Comments)    NUMBNESS/REDNESS   Pravastatin Sodium Other (See Comments)   Tramadol Hcl     Other reaction(s): GI upset   Ciprofloxacin Rash and Other (See Comments)    NUMBNESS/REDNESS/RASH    History of Present Illness    Sandra Ellis is a PMH of hyperlipidemia, COVID-19 infection 1/20, history of blood clots in 1990, hypothyroidism, PE, and essential hypertension.  She was seen by Dr. Claiborne Billings 10/22.  During that time she reported that she had been in the emergency room while in [place] with elevated blood pressures in the 200/100 range.  Her pulse was low.  Her initial troponin was 4.1 and decreased to 2.1.  She was admitted for 2 days and underwent cardiac catheterization as well as echocardiogram.  Both were normal.  She was  on atenolol which was discontinued due to her bradycardia.  She followed up with her PCP 05/09/2021.  He recommended adding amlodipine to her medication regimen.  She previously had taken atenolol which improved her migraine headaches.  She was not able to tolerate amlodipine and was prescribed losartan.  She did not start the losartan medication.  She was active swimming 5 days/week and going to the gym where she would walk on the treadmill.  Her blood pressure at the time of evaluation was 124/70.  It was recommended that her HCTZ be reduced to 12.5 mg daily and that she continue to monitor blood pressure.  Her EKG showed sinus bradycardia 54 bpm.  She was seen in follow-up by Dr. Claiborne Billings on 06/20/2022.  During that time her blood pressure continued to be well-controlled at 110/70.  She had lost around 10 pounds purposefully.  She remained active and was walking at least 2 miles a day on the treadmill and going to the gym on a daily basis.  Follow-up was planned for 1 year.    She was seen in the emergency department on 10/10/2022.  She reported that her 2 granddaughters had pneumonia.  She had a 3-day history of acute onset of productive cough and left-sided upper back pain along with epigastric pain with coughing.  She denied throat pain, ear pain, sinus pain.  She was taking OTC medications for symptoms.  Her blood  pressure was well-controlled at 126/76.  Her chest x-ray showed no acute cardiopulmonary disease.  She was diagnosed with acute bronchitis.  Supportive care was recommended.  COVID test was recommended but she declined.  Her pulse was noted to be 77.  She presents to the clinic today for follow-up evaluation and states she woke up with fast heartbeat 3/5.  She has been noting some elevated blood pressures as well as extra heartbeats.  She discussed with a friend who brought up concern for atrial fibrillation.  We reviewed her past medical history and previous atenolol dosing for headaches.  She has  been taking atenolol as needed for elevated blood pressure.  Her recent lab work is unremarkable.  Recent TSH is normal.  I will order a 7-day cardiac event monitor and plan follow-up after monitoring.  Today she denies chest pain, shortness of breath, lower extremity edema, fatigue, melena, hematuria, hemoptysis, diaphoresis, weakness, presyncope, syncope, orthopnea, and PND.    Home Medications    Prior to Admission medications   Medication Sig Start Date End Date Taking? Authorizing Provider  Ascorbic Acid (VITAMIN C PO) Take 1 tablet by mouth daily.    [provider]  benzonatate (TESSALON) 100 MG capsule Take 1 capsule (100 mg total) by mouth 3 (three) times daily as needed for cough. 10/10/22   Jaynee Eagles, PA-C  BIOTIN PO Take 1 tablet by mouth daily in the afternoon.    [provider]  cetirizine (ZYRTEC ALLERGY) 10 MG tablet Take 1 tablet (10 mg total) by mouth daily. 10/10/22   Jaynee Eagles, PA-C  Cholecalciferol (VITAMIN D3 PO) Take 5,000 Units by mouth daily. Every other day    [provider]  CINNAMON PO Take 2 tablets by mouth daily in the afternoon.    [provider]  Coenzyme Q10 (CO Q-10) 300 MG CAPS Take 300 mg by mouth daily.    [provider]  HAWTHORN BERRY PO Take 2 tablets by mouth daily in the afternoon.    [provider]  hydrochlorothiazide (HYDRODIURIL) 12.5 MG tablet Take 1 tablet (12.5 mg total) by mouth daily. 06/25/21   Troy Sine, MD  levothyroxine (SYNTHROID, LEVOTHROID) 75 MCG tablet Take 75 mcg by mouth daily before breakfast.     [provider]  Magnesium 250 MG TABS Take 250 mg by mouth daily.     [provider]  Multiple Vitamins-Minerals (HAIR SKIN AND NAILS FORMULA PO) Take 1 tablet by mouth daily.    [provider]  Multiple Vitamins-Minerals (ZINC PO) Take 1 tablet by mouth daily.    [provider]  mupirocin ointment (BACTROBAN) 2 % Apply topically 3  (three) times daily. 09/19/22   [provider]  NON FORMULARY Take 1 tablet by mouth daily in the afternoon. Beet    [provider]  Omega-3 Fatty Acids (FISH OIL) 1200 MG CAPS Take 2 capsules by mouth daily.     [provider]  Potassium (POTASSIMIN PO) Take 1 tablet by mouth daily.    [provider]  Probiotic Product (PROBIOTIC PO) Take 1 tablet by mouth daily as needed.     [provider]  promethazine-dextromethorphan (PROMETHAZINE-DM) 6.25-15 MG/5ML syrup Take 5 mLs by mouth 3 (three) times daily as needed for cough. 10/10/22   Jaynee Eagles, PA-C  pseudoephedrine (SUDAFED) 60 MG tablet Take 1 tablet (60 mg total) by mouth every 8 (eight) hours as needed for congestion. 10/10/22   Jaynee Eagles, PA-C  rosuvastatin (  CRESTOR) 5 MG tablet Take 1 tablet (5 mg total) by mouth daily. 06/25/21   Troy Sine, MD  tretinoin (RETIN-A) 0.1 % cream Apply 1 application topically at bedtime. APPLIED TO FACE 10/10/16   [provider]    Family History    Family History  Problem Relation Age of Onset   Diabetes Mother    Asthma Mother    Colon cancer Neg Hx    Esophageal cancer Neg Hx    Pancreatic cancer Neg Hx    Stomach cancer Neg Hx    She indicated that the status of her mother is unknown. She indicated that the status of her neg hx is unknown.  Social History    Social History   Socioeconomic History   Marital status: Married    Spouse name: Not on file   Number of children: 3   Years of education: Not on file   Highest education level: Not on file  Occupational History   Occupation: minister  Tobacco Use   Smoking status: Former    Packs/day: 0.50    Years: 8.00    Total pack years: 4.00    Types: Cigarettes    Quit date: 09/02/1973    Years since quitting: 49.2   Smokeless tobacco: Never  Vaping Use   Vaping Use: Never used  Substance and Sexual Activity   Alcohol use: No    Alcohol/week: 0.0 standard drinks of alcohol    Drug use: No   Sexual activity: Yes    Birth control/protection: Surgical    Comment: FIRST INTERCORSE AGE 35- ONE SEXUAL PARTNER   Other Topics Concern   Not on file  Social History Narrative   Not on file   Social Determinants of Health   Financial Resource Strain: Not on file  Food Insecurity: Not on file  Transportation Needs: Not on file  Physical Activity: Not on file  Stress: Not on file  Social Connections: Not on file  Intimate Partner Violence: Not on file     Review of Systems    General:  No chills, fever, night sweats or weight changes.  Cardiovascular:  No chest pain, dyspnea on exertion, edema, orthopnea, palpitations, paroxysmal nocturnal dyspnea. Dermatological: No rash, lesions/masses Respiratory: No cough, dyspnea Urologic: No hematuria, dysuria Abdominal:   No nausea, vomiting, diarrhea, bright red blood per rectum, melena, or hematemesis Neurologic:  No visual changes, wkns, changes in mental status. All other systems reviewed and are otherwise negative except as noted above.  Physical Exam    VS:  BP 122/80 (BP Location: Left Arm, Patient Position: Sitting, Cuff Size: Normal)   Pulse 64   Ht '5\' 6"'$  (1.676 m)   Wt 130 lb (59 kg)   BMI 20.98 kg/m  , BMI Body mass index is 20.98 kg/m. GEN: Well nourished, well developed, in no acute distress. HEENT: normal. Neck: Supple, no JVD, carotid bruits, or masses. Cardiac: RRR, no murmurs, rubs, or gallops. No clubbing, cyanosis, edema.  Radials/DP/PT 2+ and equal bilaterally.  Respiratory:  Respirations regular and unlabored, clear to auscultation bilaterally. GI: Soft, nontender, nondistended, BS + x 4. MS: no deformity or atrophy. Skin: warm and dry, no rash. Neuro:  Strength and sensation are intact. Psych: Normal affect.  Accessory Clinical Findings    Recent Labs: No results found for requested labs within last 365 days.   Recent Lipid Panel No results found for: "CHOL", "TRIG", "HDL",  "CHOLHDL", "VLDL", "LDLCALC", "LDLDIRECT"       ECG  personally reviewed by me today-none today.  Echocardiogram 02/23/2018  Study Conclusions   - Left ventricle: The cavity size was normal. Wall thickness was    normal. Systolic function was normal. The estimated ejection    fraction was in the range of 60% to 65%. Wall motion was normal;    there were no regional wall motion abnormalities.  - Aortic valve: Trileaflet; mildly thickened, mildly calcified    leaflets. There was mild regurgitation.  - Tricuspid valve: There was trivial regurgitation.   -------------------------------------------------------------------  Study data:   Study status:  Routine.  Procedure:  The patient  reported no pain pre or post test. Transthoracic echocardiography  for left ventricular function evaluation, for right ventricular  function evaluation, and for assessment of valvular function. Image  quality was adequate.  Study completion:  There were no  complications.          Transthoracic echocardiography.  M-mode,  complete 2D, spectral Doppler, and color Doppler.  Birthdate:  Patient birthdate: August 29, 1946.  Age:  Patient is 77 yr old.  Sex:  Gender: female.    BMI: 21.9 kg/m^2.  Blood pressure:     152/74  Patient status:  Outpatient.  Study date:  Study date: 02/23/2018.  Study time: 01:15 PM.  Location:  [facility]   -------------------------------------------------------------------   -------------------------------------------------------------------  Left ventricle:  The cavity size was normal. Wall thickness was  normal. Systolic function was normal. The estimated ejection  fraction was in the range of 60% to 65%. Wall motion was normal;  there were no regional wall motion abnormalities.   -------------------------------------------------------------------  Aortic valve:   Trileaflet; mildly thickened, mildly calcified  leaflets. Mobility was not restricted.  Doppler:   Transvalvular  velocity was within the normal range. There was no stenosis. There  was mild regurgitation.   -------------------------------------------------------------------  Aorta: Aortic root: The aortic root was normal in size.   -------------------------------------------------------------------  Mitral valve:   Structurally normal valve.   Mobility was not  restricted.  Doppler:  Transvalvular velocity was within the normal  range. There was no evidence for stenosis. There was no  regurgitation.    Peak gradient (D): 3 mm Hg.   -------------------------------------------------------------------  Left atrium:  The atrium was normal in size.   -------------------------------------------------------------------  Right ventricle:  The cavity size was normal. Wall thickness was  normal. Systolic function was normal.   -------------------------------------------------------------------  Pulmonic valve:   Poorly visualized.  Structurally normal valve.  Cusp separation was normal.  Doppler:  Transvalvular velocity was  within the normal range. There was no evidence for stenosis. There  was no regurgitation.   -------------------------------------------------------------------  Tricuspid valve:   Structurally normal valve.    Doppler:  Transvalvular velocity was within the normal range. There was  trivial regurgitation.   -------------------------------------------------------------------  Pulmonary artery:   The main pulmonary artery was normal-sized.  Systolic pressure was within the normal range.   -------------------------------------------------------------------  Right atrium:  The atrium was normal in size.   -------------------------------------------------------------------  Pericardium: There was no pericardial effusion.   -------------------------------------------------------------------  Systemic veins:  Inferior vena cava: The vessel was normal in size.     Cardiac catheterization 04/17/2021 Results scanned in, cardiac catheterization while in [place]   1.  Palpitations-reports over the last few weeks she has noticed extra beats.  Reports that she woke up on March 5 with elevated heart rate.  She has been taking atenolol as needed for blood pressure.  Reports that she has not had any recent illnesses or had any dietary changes.  She has 1 cup of coffee in the morning and drinks around 90 ounces daily.  Does note recent increased stress related to new house in [place] and buying a house in [place].  She continues to be very physically active walking daily.  Does not notice palpitations with increased physical activity.  Recent lab work with PCP including TSH were normal. Order 7-day cardiac event monitor Avoid triggers-caffeine, chocolate, EtOH, dehydration etc.  Essential hypertension-BP today 122/80.  Previously was taking atenolol as needed. Continue atenolol 12.5 mg daily Heart healthy low-sodium diet Maintain physical activity  Hyperlipidemia-LDL 150 on 10/15/2022. Heart healthy low-sodium high-fiber diet Continue rosuvastatin, co-Q10 Maintain physical activity  Hypothyroidism-denies lethargy, palpitations, weight gain.  TSH 0.66 on 7/19.  Recent TSH with PE reportedly normal. Continue levothyroxine Follows with PCP  Disposition: Follow-up with Dr. Claiborne Billings or me in 8 weeks   Jossie Ng. Leane Loring NP-C     11/12/2022, 2:37 PM [address] Office [phone] Fax [phone]    I spent 14 minutes examining this patient, reviewing medications, and using patient centered shared decision making involving her cardiac care.  Prior to her visit I spent greater than 20 minutes reviewing her past medical history,  medications, and prior cardiac tests.

## 2022-11-11 ENCOUNTER — Ambulatory Visit: Payer: Medicare Other | Admitting: General Practice

## 2022-11-12 ENCOUNTER — Encounter: Payer: Self-pay | Admitting: General Practice

## 2022-11-12 ENCOUNTER — Ambulatory Visit (INDEPENDENT_AMBULATORY_CARE_PROVIDER_SITE_OTHER): Payer: Medicare Other

## 2022-11-12 ENCOUNTER — Ambulatory Visit: Payer: Medicare Other | Attending: General Practice | Admitting: General Practice

## 2022-11-12 VITALS — BP 122/80 | HR 64 | Ht 66.0 in | Wt 130.0 lb

## 2022-11-12 DIAGNOSIS — I10 Essential (primary) hypertension: Secondary | ICD-10-CM | POA: Diagnosis not present

## 2022-11-12 DIAGNOSIS — E039 Hypothyroidism, unspecified: Secondary | ICD-10-CM

## 2022-11-12 DIAGNOSIS — R002 Palpitations: Secondary | ICD-10-CM | POA: Insufficient documentation

## 2022-11-12 DIAGNOSIS — E785 Hyperlipidemia, unspecified: Secondary | ICD-10-CM | POA: Insufficient documentation

## 2022-11-12 DIAGNOSIS — R0789 Other chest pain: Secondary | ICD-10-CM | POA: Insufficient documentation

## 2022-11-12 NOTE — Progress Notes (Unsigned)
Enrolled patient for a 7 day Zio XT monitor to be mailed to patients home   Dr Claiborne Billings to read

## 2022-11-12 NOTE — Patient Instructions (Signed)
Medication Instructions:  The current medical regimen is effective;  continue present plan and medications as directed. Please refer to the Current Medication list given to you today.  *If you need a refill on your cardiac medications before your next appointment, please call your pharmacy*  Lab Work: NONE If you have labs (blood work) drawn today and your tests are completely normal, you will receive your results only by:  [place] (if you have MyChart) OR  A paper copy in the mail  If you have any lab test that is abnormal or we need to change your treatment, we will call you to review the results.   Testing/Procedures: Your physician has requested you wear a ZIO patch monitor for 7 days.    Follow-Up: At [facility], you and your health needs are our priority.  As part of our continuing mission to provide you with exceptional heart care, we have created designated Provider Care Teams.  These Care Teams include your primary Cardiologist (physician) and Advanced Practice Providers (APPs -  Physician Assistants and Nurse Practitioners) who all work together to provide you with the care you need, when you need it.  We recommend signing up for the patient portal called "MyChart".  Sign up information is provided on this After Visit Summary.  MyChart is used to connect with patients for Virtual Visits (Telemedicine).  Patients are able to view lab/test results, encounter notes, upcoming appointments, etc.  Non-urgent messages can be sent to your provider as well.   To learn more about what you can do with MyChart, go to [email or web address].    Your next appointment:   8 week(s)  Provider:   DR Claiborne Billings  or Coletta Memos, FNP        Other Instructions  Please try to avoid these triggers: Do not use any products that have nicotine or tobacco in them. These include cigarettes, e-cigarettes, and chewing tobacco. If you need help quitting, ask your doctor. Eat heart-healthy  foods. Talk with your doctor about the right eating plan for you. Exercise regularly as told by your doctor. Stay hydrated Do not drink alcohol, Caffeine or chocolate. Lose weight if you are overweight. Do not use drugs, including cannabis       ZIO XT- Long Term Monitor Instructions  Your physician has requested you wear a ZIO patch monitor for 7 days.  This is a single patch monitor. [employer] supplies one patch monitor per enrollment. Additional stickers are not available. Please do not apply patch if you will be having a Nuclear Stress Test,  Echocardiogram, Cardiac CT, MRI, or Chest Xray during the period you would be wearing the  monitor. The patch cannot be worn during these tests. You cannot remove and re-apply the  ZIO XT patch monitor.  Your ZIO patch monitor will be mailed 3 day USPS to your address on file. It may take 3-5 days  to receive your monitor after you have been enrolled.  Once you have received your monitor, please review the enclosed instructions. Your monitor  has already been registered assigning a specific monitor serial # to you.  Billing and Patient Assistance Program Information  We have supplied [employer] with any of your insurance information on file for billing purposes. [employer] offers a sliding scale Patient Assistance Program for patients that do not have  insurance, or whose insurance does not completely cover the cost of the ZIO monitor.  You must apply for the Patient Assistance Program to qualify for this  discounted rate.  To apply, please call [employer] at [phone], select option 4, select option 2, ask to apply for  Patient Assistance Program. Theodore Demark will ask your household income, and how many people  are in your household. They will quote your out-of-pocket cost based on that information.  [employer] will also be able to set up a 42-month interest-free payment plan if needed.  Applying the monitor   Shave hair from upper left chest.  Hold  abrader disc by orange tab. Rub abrader in 40 strokes over the upper left chest as  indicated in your monitor instructions.  Clean area with 4 enclosed alcohol pads. Let dry.  Apply patch as indicated in monitor instructions. Patch will be placed under collarbone on left  side of chest with arrow pointing upward.  Rub patch adhesive wings for 2 minutes. Remove white label marked "1". Remove the white  label marked "2". Rub patch adhesive wings for 2 additional minutes.  While looking in a mirror, press and release button in center of patch. A small green light will  flash 3-4 times. This will be your only indicator that the monitor has been turned on.  Do not shower for the first 24 hours. You may shower after the first 24 hours.  Press the button if you feel a symptom. You will hear a small click. Record Date, Time and  Symptom in the Patient Logbook.  When you are ready to remove the patch, follow instructions on the last 2 pages of Patient  Logbook. Stick patch monitor onto the last page of Patient Logbook.  Place Patient Logbook in the blue and white box. Use locking tab on box and tape box closed  securely. The blue and white box has prepaid postage on it. Please place it in the mailbox as  soon as possible. Your physician should have your test results approximately 7 days after the  monitor has been [facility]  Call [place]at [phone] you have questions regarding  your ZIO XT patch monitor. Call them immediately if you see an orange light blinking on your  monitor.  If your monitor falls off in less than 4 days, contact our Monitor department at [phone]  If your monitor becomes loose or falls off after 4 days call [employer] at [phone]  suggestions on securing your monitor

## 2022-11-28 DIAGNOSIS — R109 Unspecified abdominal pain: Secondary | ICD-10-CM | POA: Diagnosis not present

## 2022-11-28 DIAGNOSIS — Z87442 Personal history of urinary calculi: Secondary | ICD-10-CM | POA: Diagnosis not present

## 2022-12-05 DIAGNOSIS — Z1231 Encounter for screening mammogram for malignant neoplasm of breast: Secondary | ICD-10-CM | POA: Diagnosis not present

## 2022-12-05 DIAGNOSIS — R002 Palpitations: Secondary | ICD-10-CM | POA: Diagnosis not present

## 2022-12-20 DIAGNOSIS — R002 Palpitations: Secondary | ICD-10-CM | POA: Diagnosis not present

## 2023-01-07 NOTE — Progress Notes (Unsigned)
Cardiology Clinic Note   Patient Name: Sandra Ellis Date of Encounter: 01/09/2023  Primary Care Provider:  Johny Blamer, MD Primary Cardiologist:  Nicki Guadalajara, MD  Patient Profile    Sandra Ellis 77 year old female presents to the clinic today for follow-up evaluation of her chest discomfort.  Past Medical History    Past Medical History:  Diagnosis Date   COVID-19 virus detected 09/07/2018   Elevated cholesterol    H/O blood clots 1990   Hx of colonic polyp    Hypothyroidism    Pulmonary emboli [facility])    Past Surgical History:  Procedure Laterality Date   COLONOSCOPY  11/09/2014   Colonic polyp status polypectomy. Small internal hemorrhoids. Tubular adenoma.   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   Procedure: LEFT TOTAL HIP ARTHROPLASTY ANTERIOR APPROACH;  Surgeon: Durene Romans, MD;  Location: [facility];  Service: Orthopedics;  Laterality: Left;   VAGINAL HYSTERECTOMY     TVH    Allergies  Allergies  Allergen Reactions   Nitrofuran Derivatives Shortness Of Breath and Rash   Atorvastatin     Other reaction(s): muscle aches   Levaquin [Levofloxacin In D5w] Other (See Comments)    NUMBNESS/REDNESS   Pravastatin Sodium Other (See Comments)   Tramadol Hcl     Other reaction(s): GI upset   Ciprofloxacin Rash and Other (See Comments)    NUMBNESS/REDNESS/RASH    History of Present Illness    [name] is a PMH of hyperlipidemia, COVID-19 infection 1/20, history of blood clots in 1990, hypothyroidism, PE, and essential hypertension.  She was seen by Dr. Tresa Endo 10/22.  During that time she reported that she had been in the emergency room while in [place] with elevated blood pressures in the 200/100 range.  Her pulse was low.  Her initial troponin was 4.1 and decreased to 2.1.  She was admitted for 2 days and underwent cardiac catheterization as well as echocardiogram.  Both were  normal.  She was on atenolol which was discontinued due to her bradycardia.  She followed up with her PCP 05/09/2021.  He recommended adding amlodipine to her medication regimen.  She previously had taken atenolol which improved her migraine headaches.  She was not able to tolerate amlodipine and was prescribed losartan.  She did not start the losartan medication.  She was active swimming 5 days/week and going to the gym where she would walk on the treadmill.  Her blood pressure at the time of evaluation was 124/70.  It was recommended that her HCTZ be reduced to 12.5 mg daily and that she continue to monitor blood pressure.  Her EKG showed sinus bradycardia 54 bpm.  She was seen in follow-up by Dr. Tresa Endo on 06/20/2022.  During that time her blood pressure continued to be well-controlled at 110/70.  She had lost around 10 pounds purposefully.  She remained active and was walking at least 2 miles a day on the treadmill and going to the gym on a daily basis.  Follow-up was planned for 1 year.    She was seen in the emergency department on 10/10/2022.  She reported that her 2 granddaughters had pneumonia.  She had a 3-day history of acute onset of productive cough and left-sided upper back pain along with epigastric pain with coughing.  She denied throat pain, ear pain, sinus pain.  She was taking OTC medications for symptoms.  Her blood pressure was well-controlled at 126/76.  Her chest x-ray showed no acute cardiopulmonary disease.  She was diagnosed with acute bronchitis.  Supportive care was recommended.  COVID test was recommended but she declined.  Her pulse was noted to be 77.  She presented to the clinic 11/12/22 for follow-up evaluation and stated she woke up with fast heartbeat 3/5.  She had been noting some elevated blood pressures as well as extra heartbeats.  She discussed with a friend who brought up concern for atrial fibrillation.  We reviewed her past medical history and previous atenolol dosing for  headaches.  She had been taking atenolol as needed for elevated blood pressure.  Her recent lab work was unremarkable.  Recent TSH is normal.  I  ordered a 7-day cardiac event monitor and planned follow-up after monitoring.  Her cardiac event monitor showed a minimum heart rate of 43, maximum heart rate of 154 and an average heart rate of 63.  She had predominantly sinus rhythm.  She was noted to have 6 runs of SVT with the fastest being 7 beats with a maximum rate of 154 and the longest being 15 beats with an average heart rate of 90.  She presents to the clinic today for follow-up evaluation and states she was not able to continue to take atenolol due to feelings of head pressure.  She has been off of atenolol for several days and has not noticed increased palpitations.  We reviewed her cardiac event monitor and she expressed understanding.  I reviewed vagal maneuvers for SVT.  She reports that she is no longer taking cholesterol medication.  She has been taking over-the-counter fish oil.  We reviewed her previous cardiac catheterization and coronary CTA.  We used shared decision-making to decide to defer cholesterol medication at this time.  We will plan follow-up in 12 months.  Today she denies chest pain, shortness of breath, lower extremity edema, fatigue, melena, hematuria, hemoptysis, diaphoresis, weakness, presyncope, syncope, orthopnea, and PND.    Home Medications    Prior to Admission medications   Medication Sig Start Date End Date Taking? Authorizing Provider  Ascorbic Acid (VITAMIN C PO) Take 1 tablet by mouth daily.    [provider]  benzonatate (TESSALON) 100 MG capsule Take 1 capsule (100 mg total) by mouth 3 (three) times daily as needed for cough. 10/10/22   Wallis Bamberg, PA-C  BIOTIN PO Take 1 tablet by mouth daily in the afternoon.    [provider]  cetirizine (ZYRTEC ALLERGY) 10 MG tablet Take 1 tablet (10 mg total) by mouth daily. 10/10/22   Wallis Bamberg, PA-C   Cholecalciferol (VITAMIN D3 PO) Take 5,000 Units by mouth daily. Every other day    [provider]  CINNAMON PO Take 2 tablets by mouth daily in the afternoon.    [provider]  Coenzyme Q10 (CO Q-10) 300 MG CAPS Take 300 mg by mouth daily.    [provider]  HAWTHORN BERRY PO Take 2 tablets by mouth daily in the afternoon.    [provider]  hydrochlorothiazide (HYDRODIURIL) 12.5 MG tablet Take 1 tablet (12.5 mg total) by mouth daily. 06/25/21   Lennette Bihari, MD  levothyroxine (SYNTHROID, LEVOTHROID) 75 MCG tablet Take 75 mcg by mouth daily before breakfast.     [provider]  Magnesium 250 MG TABS Take 250 mg by mouth daily.     [provider]  Multiple Vitamins-Minerals (HAIR SKIN AND NAILS FORMULA PO)  Take 1 tablet by mouth daily.    [provider]  Multiple Vitamins-Minerals (ZINC PO) Take 1 tablet by mouth daily.    [provider]  mupirocin ointment (BACTROBAN) 2 % Apply topically 3 (three) times daily. 09/19/22   [provider]  NON FORMULARY Take 1 tablet by mouth daily in the afternoon. Beet    [provider]  Omega-3 Fatty Acids (FISH OIL) 1200 MG CAPS Take 2 capsules by mouth daily.     [provider]  Potassium (POTASSIMIN PO) Take 1 tablet by mouth daily.    [provider]  Probiotic Product (PROBIOTIC PO) Take 1 tablet by mouth daily as needed.     [provider]  promethazine-dextromethorphan (PROMETHAZINE-DM) 6.25-15 MG/5ML syrup Take 5 mLs by mouth 3 (three) times daily as needed for cough. 10/10/22   Wallis Bamberg, PA-C  pseudoephedrine (SUDAFED) 60 MG tablet Take 1 tablet (60 mg total) by mouth every 8 (eight) hours as needed for congestion. 10/10/22   Wallis Bamberg, PA-C  rosuvastatin (CRESTOR) 5 MG tablet Take 1 tablet (5 mg total) by mouth daily. 06/25/21   Lennette Bihari, MD  tretinoin (RETIN-A) 0.1 % cream Apply 1 application topically at bedtime.  APPLIED TO FACE 10/10/16   [provider]    Family History    Family History  Problem Relation Age of Onset   Diabetes Mother    Asthma Mother    Colon cancer Neg Hx    Esophageal cancer Neg Hx    Pancreatic cancer Neg Hx    Stomach cancer Neg Hx    She indicated that the status of her mother is unknown. She indicated that the status of her neg hx is unknown.  Social History    Social History   Socioeconomic History   Marital status: Married    Spouse name: Not on file   Number of children: 3   Years of education: Not on file   Highest education level: Not on file  Occupational History   Occupation: minister  Tobacco Use   Smoking status: Former    Packs/day: 0.50    Years: 8.00    Additional pack years: 0.00    Total pack years: 4.00    Types: Cigarettes    Quit date: 09/02/1973    Years since quitting: 49.3   Smokeless tobacco: Never  Vaping Use   Vaping Use: Never used  Substance and Sexual Activity   Alcohol use: No    Alcohol/week: 0.0 standard drinks of alcohol   Drug use: No   Sexual activity: Yes    Birth control/protection: Surgical    Comment: FIRST INTERCORSE AGE 42- ONE SEXUAL PARTNER   Other Topics Concern   Not on file  Social History Narrative   Not on file   Social Determinants of Health   Financial Resource Strain: Not on file  Food Insecurity: Not on file  Transportation Needs: Not on file  Physical Activity: Not on file  Stress: Not on file  Social Connections: Not on file  Intimate Partner Violence: Not on file     Review of Systems    General:  No chills, fever, night sweats or weight changes.  Cardiovascular:  No chest pain, dyspnea on exertion, edema, orthopnea, palpitations, paroxysmal nocturnal dyspnea. Dermatological: No rash, lesions/masses Respiratory: No cough, dyspnea Urologic: No hematuria, dysuria Abdominal:   No nausea, vomiting, diarrhea, bright red blood per rectum, melena, or hematemesis Neurologic:  No  visual changes, wkns,  changes in mental status. All other systems reviewed and are otherwise negative except as noted above.  Physical Exam    VS:  BP 112/82   Pulse 62   Ht 5\' 6"  (1.676 m)   Wt 132 lb (59.9 kg)   SpO2 98%   BMI 21.31 kg/m  , BMI Body mass index is 21.31 kg/m. GEN: Well nourished, well developed, in no acute distress. HEENT: normal. Neck: Supple, no JVD, carotid bruits, or masses. Cardiac: RRR, no murmurs, rubs, or gallops. No clubbing, cyanosis, edema.  Radials/DP/PT 2+ and equal bilaterally.  Respiratory:  Respirations regular and unlabored, clear to auscultation bilaterally. GI: Soft, nontender, nondistended, BS + x 4. MS: no deformity or atrophy. Skin: warm and dry, no rash. Neuro:  Strength and sensation are intact. Psych: Normal affect.  Accessory Clinical Findings    Recent Labs: No results found for requested labs within last 365 days.   Recent Lipid Panel No results found for: "CHOL", "TRIG", "HDL", "CHOLHDL", "VLDL", "LDLCALC", "LDLDIRECT"       ECG personally reviewed by me today-none today.  Echocardiogram 02/23/2018  Study Conclusions   - Left ventricle: The cavity size was normal. Wall thickness was    normal. Systolic function was normal. The estimated ejection    fraction was in the range of 60% to 65%. Wall motion was normal;    there were no regional wall motion abnormalities.  - Aortic valve: Trileaflet; mildly thickened, mildly calcified    leaflets. There was mild regurgitation.  - Tricuspid valve: There was trivial regurgitation.   -------------------------------------------------------------------  Study data:   Study status:  Routine.  Procedure:  The patient  reported no pain pre or post test. Transthoracic echocardiography  for left ventricular function evaluation, for right ventricular  function evaluation, and for assessment of valvular function. Image  quality was adequate.  Study completion:  There were no   complications.          Transthoracic echocardiography.  M-mode,  complete 2D, spectral Doppler, and color Doppler.  Birthdate:  Patient birthdate: July 01, 1946.  Age:  Patient is 77 yr old.  Sex:  Gender: female.    BMI: 21.9 kg/m^2.  Blood pressure:     152/74  Patient status:  Outpatient.  Study date:  Study date: 02/23/2018.  Study time: 01:15 PM.  Location:  [facility] [facility]   -------------------------------------------------------------------   -------------------------------------------------------------------  Left ventricle:  The cavity size was normal. Wall thickness was  normal. Systolic function was normal. The estimated ejection  fraction was in the range of 60% to 65%. Wall motion was normal;  there were no regional wall motion abnormalities.   -------------------------------------------------------------------  Aortic valve:   Trileaflet; mildly thickened, mildly calcified  leaflets. Mobility was not restricted.  Doppler:  Transvalvular  velocity was within the normal range. There was no stenosis. There  was mild regurgitation.   -------------------------------------------------------------------  Aorta: Aortic root: The aortic root was normal in size.   -------------------------------------------------------------------  Mitral valve:   Structurally normal valve.   Mobility was not  restricted.  Doppler:  Transvalvular velocity was within the normal  range. There was no evidence for stenosis. There was no  regurgitation.    Peak gradient (D): 3 mm Hg.   -------------------------------------------------------------------  Left atrium:  The atrium was normal in size.   -------------------------------------------------------------------  Right ventricle:  The cavity size was normal. Wall thickness was  normal. Systolic function was normal.   -------------------------------------------------------------------  Pulmonic valve:   Poorly visualized.  Structurally  normal valve.  Cusp separation was normal.  Doppler:  Transvalvular velocity was  within the normal range. There was no evidence for stenosis. There  was no regurgitation.   -------------------------------------------------------------------  Tricuspid valve:   Structurally normal valve.    Doppler:  Transvalvular velocity was within the normal range. There was  trivial regurgitation.   -------------------------------------------------------------------  Pulmonary artery:   The main pulmonary artery was normal-sized.  Systolic pressure was within the normal range.   -------------------------------------------------------------------  Right atrium:  The atrium was normal in size.   -------------------------------------------------------------------  Pericardium: There was no pericardial effusion.   -------------------------------------------------------------------  Systemic veins:  Inferior vena cava: The vessel was normal in size.    Cardiac catheterization 04/17/2021 Results scanned in, cardiac catheterization while in [place]   Cardiac event monitor 12/20/2022 Patch Wear Time:  6 days and 4 hours (2024-04-04T16:02:51-0400 to 2024-04-10T20:43:40-0400)   Patient had a min HR of 43 bpm, max HR of 154 bpm, and avg HR of 63 bpm. Predominant underlying rhythm was Sinus Rhythm. 6 Supraventricular Tachycardia runs occurred, the run with the fastest interval lasting 7 beats with a max rate of 154 bpm, the  longest lasting 15 beats with an avg rate of 90 bpm. Isolated SVEs were rare (<1.0%), SVE Couplets were rare (<1.0%), and SVE Triplets were rare (<1.0%). Isolated VEs were rare (<1.0%), VE Couplets were rare (<1.0%), and no VE Triplets were present.   Assessment & Plan   1. Palpitations-denies episodes of accelerated heart rate.  Cardiac event monitor showed 6 episodes of SVT, and rare ventricular ectopy, with predominantly normal sinus rhythm.  She was previously taking her atenolol  as needed for blood pressure.  Recommended regular daily administration.  Atenolol 12.5 mg PRN May use vagal maneuvers for SVT Avoid triggers-caffeine, chocolate, EtOH, dehydration etc.  Essential hypertension-BP today 112/82.  Previously was taking atenolol as needed. Continue atenolol 12.5 mg daily Heart healthy low-sodium diet Maintain physical activity  Hypothyroidism-  TSH 0.66 on 7/19.  Recent TSH with PCP  normal. Continue levothyroxine Follows with PCP  Hyperli[idemia- LDL 150.  High fiber diet.  Maintain physical activity  Disposition: Follow-up with Dr. Tresa Endo or me in 12 months.   Thomasene Ripple. Lusia Greis NP-C     01/09/2023, 10:46 AM [facility] [address] Office [phone] Fax [phone]    I spent 14 minutes examining this patient, reviewing medications, and using patient centered shared decision making involving her cardiac care.  Prior to her visit I spent greater than 20 minutes reviewing her past medical history,  medications, and prior cardiac tests.

## 2023-01-09 ENCOUNTER — Encounter: Payer: Self-pay | Admitting: General Practice

## 2023-01-09 ENCOUNTER — Ambulatory Visit: Payer: Medicare Other | Attending: General Practice | Admitting: General Practice

## 2023-01-09 VITALS — BP 112/82 | HR 62 | Ht 66.0 in | Wt 132.0 lb

## 2023-01-09 DIAGNOSIS — I10 Essential (primary) hypertension: Secondary | ICD-10-CM | POA: Insufficient documentation

## 2023-01-09 DIAGNOSIS — E039 Hypothyroidism, unspecified: Secondary | ICD-10-CM | POA: Diagnosis not present

## 2023-01-09 DIAGNOSIS — R002 Palpitations: Secondary | ICD-10-CM | POA: Insufficient documentation

## 2023-01-09 MED ORDER — ATENOLOL 25 MG PO TABS: 12.5000 mg | ORAL_TABLET | ORAL | Status: DC | PRN

## 2023-01-09 NOTE — Patient Instructions (Signed)
Medication Instructions:  TAKE YOUR ATENOLOL AS NEEDED FOR PALPITATIONS *If you need a refill on your cardiac medications before your next appointment, please call your pharmacy*  Lab Work: NONE If you have labs (blood work) drawn today and your tests are completely normal, you will receive your results only by:  MyChart Message (if you have MyChart) OR A paper copy in the mail If you have any lab test that is abnormal or we need to change your treatment, we will call you to review the results.  Other Instructions Please try to avoid these triggers: Do not use any products that have nicotine or tobacco in them. These include cigarettes, e-cigarettes, and chewing tobacco. If you need help quitting, ask your doctor. Eat heart-healthy foods. Talk with your doctor about the right eating plan for you. Exercise regularly as told by your doctor. Stay hydrated Do not drink alcohol, Caffeine or chocolate. Lose weight if you are overweight. Do not use drugs, including cannabis  Follow-Up: At [facility], you and your health needs are our priority.  As part of our continuing mission to provide you with exceptional heart care, we have created designated Provider Care Teams.  These Care Teams include your primary Cardiologist (physician) and Advanced Practice Providers (APPs -  Physician Assistants and Nurse Practitioners) who all work together to provide you with the care you need, when you need it.  Your next appointment:   12 month(s)  Provider:   Edd Fabian, FNP-C

## 2023-01-09 NOTE — Addendum Note (Signed)
Addended by: Alyson Ingles on: 01/09/2023 11:10 AM   Modules accepted: Orders

## 2023-02-03 DIAGNOSIS — B370 Candidal stomatitis: Secondary | ICD-10-CM | POA: Diagnosis not present

## 2023-02-03 DIAGNOSIS — R519 Headache, unspecified: Secondary | ICD-10-CM | POA: Diagnosis not present

## 2023-02-03 DIAGNOSIS — J029 Acute pharyngitis, unspecified: Secondary | ICD-10-CM | POA: Diagnosis not present

## 2023-02-03 DIAGNOSIS — Z20822 Contact with and (suspected) exposure to covid-19: Secondary | ICD-10-CM | POA: Diagnosis not present

## 2023-02-06 DIAGNOSIS — H52203 Unspecified astigmatism, bilateral: Secondary | ICD-10-CM | POA: Diagnosis not present

## 2023-02-06 DIAGNOSIS — H2513 Age-related nuclear cataract, bilateral: Secondary | ICD-10-CM | POA: Diagnosis not present

## 2023-02-06 DIAGNOSIS — H11001 Unspecified pterygium of right eye: Secondary | ICD-10-CM | POA: Diagnosis not present

## 2023-02-06 DIAGNOSIS — H5203 Hypermetropia, bilateral: Secondary | ICD-10-CM | POA: Diagnosis not present

## 2023-02-18 DIAGNOSIS — E039 Hypothyroidism, unspecified: Secondary | ICD-10-CM | POA: Diagnosis not present

## 2023-02-18 DIAGNOSIS — I10 Essential (primary) hypertension: Secondary | ICD-10-CM | POA: Diagnosis not present

## 2023-02-18 DIAGNOSIS — R002 Palpitations: Secondary | ICD-10-CM | POA: Diagnosis not present

## 2023-02-18 DIAGNOSIS — E119 Type 2 diabetes mellitus without complications: Secondary | ICD-10-CM | POA: Diagnosis not present

## 2023-02-18 DIAGNOSIS — R5383 Other fatigue: Secondary | ICD-10-CM | POA: Diagnosis not present

## 2023-07-09 ENCOUNTER — Ambulatory Visit (INDEPENDENT_AMBULATORY_CARE_PROVIDER_SITE_OTHER): Payer: Medicare Other | Admitting: Nurse Practitioner

## 2023-07-09 VITALS — BP 124/82 | HR 77 | Wt 131.0 lb

## 2023-07-09 DIAGNOSIS — N762 Acute vulvitis: Secondary | ICD-10-CM

## 2023-07-09 DIAGNOSIS — R350 Frequency of micturition: Secondary | ICD-10-CM

## 2023-07-09 LAB — WET PREP FOR TRICH, YEAST, CLUE

## 2023-07-09 MED ORDER — CLOBETASOL PROPIONATE 0.05 % EX OINT: 1.0000 | TOPICAL_OINTMENT | Freq: Two times a day (BID) | CUTANEOUS | 0 refills | Status: DC

## 2023-07-09 NOTE — Progress Notes (Signed)
   Acute Office Visit  Subjective:    Patient ID: Sandra Ellis, female    DOB: 1945-12-27, 77 y.o.   MRN: [id number]   HPI 77 y.o. presents today for vulvar itching and possible urinary frequency. Itching is on left inner labia minora. No discharge, no odor. Drove here from [place] recently and worried she has developed a UTI due to urinating less often. Denies dysuria, urgency, hematuria or back pain.   No LMP recorded. Patient has had a hysterectomy.    Review of Systems  Constitutional: Negative.   Genitourinary:  Positive for frequency, genital sores and vaginal pain (Vulvar itching). Negative for difficulty urinating, dysuria, flank pain, hematuria, urgency and vaginal discharge.       Objective:    Physical Exam Constitutional:      Appearance: Normal appearance.  Genitourinary:    Vagina: Normal.       Comments: Atrophic changes    BP 124/82   Pulse 77   Wt 131 lb (59.4 kg)   SpO2 100%   BMI 21.14 kg/m  Wt Readings from Last 3 Encounters:  07/09/23 131 lb (59.4 kg)  01/09/23 132 lb (59.9 kg)  11/12/22 130 lb (59 kg)        Patient informed chaperone available to be present for breast and/or pelvic exam. Patient has requested no chaperone to be present. Patient has been advised what will be completed during breast and pelvic exam.   Wet prep negative for pathogens UA 1+ leukocytes, neg nitrites, neg blood, yellow/clear. Microscopic: wbc 6-10, no rbcs, few bacteria  Assessment & Plan:   Problem List Items Addressed This Visit   None Visit Diagnoses     Acute vulvitis    -  Primary   Relevant Medications   clobetasol ointment (TEMOVATE) 0.05 %   Other Relevant Orders   WET PREP FOR TRICH, YEAST, CLUE   Frequency of urination       Relevant Orders   Urinalysis,Complete w/RFL Culture      Plan: Negative wet prep and UA. Apply Clobetasol 0.05% ointment BID x 7-10 days externally.   Return if symptoms worsen or fail to improve.    Olivia Mackie DNP, 9:20 AM 07/09/2023

## 2023-07-10 LAB — URINALYSIS, COMPLETE W/RFL CULTURE
Bilirubin Urine: NEGATIVE
Glucose, UA: NEGATIVE
Hgb urine dipstick: NEGATIVE
Hyaline Cast: NONE SEEN /[LPF]
Ketones, ur: NEGATIVE
Nitrites, Initial: NEGATIVE
Protein, ur: NEGATIVE
RBC / HPF: NONE SEEN /[HPF] (ref 0–2)
Specific Gravity, Urine: 1.015 (ref 1.001–1.035)
pH: 7 (ref 5.0–8.0)

## 2023-07-10 LAB — URINE CULTURE
MICRO NUMBER:: 15693996
SPECIMEN QUALITY:: ADEQUATE

## 2023-07-10 LAB — CULTURE INDICATED

## 2023-07-15 ENCOUNTER — Encounter: Payer: Self-pay | Admitting: Gastroenterology

## 2023-09-15 ENCOUNTER — Telehealth: Payer: Self-pay

## 2023-09-15 ENCOUNTER — Ambulatory Visit (AMBULATORY_SURGERY_CENTER): Payer: Medicare Other

## 2023-09-15 VITALS — Ht 66.5 in | Wt 127.0 lb

## 2023-09-15 DIAGNOSIS — Z860100 Personal history of colon polyps, unspecified: Secondary | ICD-10-CM

## 2023-09-15 MED ORDER — NA SULFATE-K SULFATE-MG SULF 17.5-3.13-1.6 GM/177ML PO SOLN: 1.0000 | Freq: Once | ORAL | 0 refills | Status: AC

## 2023-09-15 NOTE — Telephone Encounter (Signed)
 During Pre visit patient stated that during admission on her last procedure she had a difficult time.  She stated that it took the nurse 40 minutes to obtain and IV on her and her anxiety was so high that she almost cancelled the procedure.  She stated that she would like Dr. Charlanne to prescribe something for her to take for her anxiety prior to coming to the procedure.  Her colonoscopy is scheduled for 10/17/23.

## 2023-09-15 NOTE — Progress Notes (Signed)

## 2023-09-16 NOTE — Telephone Encounter (Signed)
 Per Dr. Urban Gibson instructions, Medication was sent to the pharmacy in [place] that was requested by patient.  Patient verbally understood instructions regarding medication.

## 2023-09-16 NOTE — Telephone Encounter (Signed)
 Please call in Xanax 0.5 mg Q 4-6 hrsd prn (#5 tabs), NR-driving and sedation precautions RG

## 2023-10-16 ENCOUNTER — Telehealth: Payer: Self-pay | Admitting: Gastroenterology

## 2023-10-16 NOTE — Telephone Encounter (Signed)
Returned pts call.  Advised her that she is ok to have apple cider vinegar today and tomorrow.

## 2023-10-16 NOTE — Telephone Encounter (Signed)
Inbound call from patient stating that she is scheduled for a colonoscopy with Dr. Chales Abrahams tomorrow at 4:00. Patient states she normally takes apple cider vinegar gummy's for her acid reflux and they are red so she is not able to take them. Patient is requesting a call to discuss if she can have straight  apple cider vinegar instead. Please advise.

## 2023-10-17 ENCOUNTER — Ambulatory Visit: Payer: Medicare Other | Admitting: Gastroenterology

## 2023-10-17 ENCOUNTER — Encounter: Payer: Self-pay | Admitting: Gastroenterology

## 2023-10-17 VITALS — BP 121/57 | HR 55 | Temp 97.3°F | Resp 17 | Ht 66.0 in | Wt 127.0 lb

## 2023-10-17 DIAGNOSIS — Z860100 Personal history of colon polyps, unspecified: Secondary | ICD-10-CM

## 2023-10-17 DIAGNOSIS — Z860101 Personal history of adenomatous and serrated colon polyps: Secondary | ICD-10-CM | POA: Diagnosis not present

## 2023-10-17 DIAGNOSIS — K640 First degree hemorrhoids: Secondary | ICD-10-CM | POA: Diagnosis not present

## 2023-10-17 DIAGNOSIS — Z1211 Encounter for screening for malignant neoplasm of colon: Secondary | ICD-10-CM | POA: Diagnosis not present

## 2023-10-17 DIAGNOSIS — K5730 Diverticulosis of large intestine without perforation or abscess without bleeding: Secondary | ICD-10-CM

## 2023-10-17 MED ORDER — SODIUM CHLORIDE 0.9 % IV SOLN: 500.0000 mL | INTRAVENOUS | Status: DC

## 2023-10-17 NOTE — Op Note (Signed)
[facility] Patient Name: Sandra Ellis Procedure Date: 10/17/2023 3:21 PM MRN: [id number] Endoscopist: Lynann Bologna , MD, [id number] Age: 78 Referring MD:  Date of Birth: 01/04/1946 Gender: Female Account #: [id number] Procedure:                Colonoscopy Indications:              High risk colon cancer surveillance: Personal                            history of colonic polyps Medicines:                Monitored Anesthesia Care Procedure:                Pre-Anesthesia Assessment:                           - Prior to the procedure, a History and Physical                            was performed, and patient medications and                            allergies were reviewed. The patient's tolerance of                            previous anesthesia was also reviewed. The risks                            and benefits of the procedure and the sedation                            options and risks were discussed with the patient.                            All questions were answered, and informed consent                            was obtained. Prior Anticoagulants: The patient has                            taken no anticoagulant or antiplatelet agents. ASA                            Grade Assessment: II - A patient with mild systemic                            disease. After reviewing the risks and benefits,                            the patient was deemed in satisfactory condition to                            undergo the procedure.  After obtaining informed consent, the colonoscope                            was passed under direct vision. Throughout the                            procedure, the patient's blood pressure, pulse, and                            oxygen saturations were monitored continuously. The                            Olympus Scope SN [id number] was introduced through the                            anus and advanced to the 2 cm  into the ileum. The                            colonoscopy was performed without difficulty. The                            patient tolerated the procedure well. The quality                            of the bowel preparation was good. The terminal                            ileum, ileocecal valve, appendiceal orifice, and                            rectum were photographed. Scope In: 3:26:24 PM Scope Out: 3:45:40 PM Scope Withdrawal Time: 0 hours 13 minutes 2 seconds  Total Procedure Duration: 0 hours 19 minutes 16 seconds  Findings:                 Rare small-mouthed diverticula were found in the                            sigmoid colon and ascending colon.                           Non-bleeding internal hemorrhoids were found during                            retroflexion. The hemorrhoids were small and Grade                            I (internal hemorrhoids that do not prolapse).                           The terminal ileum appeared normal.                           The exam was otherwise without abnormality on  direct and retroflexion views. Complications:            No immediate complications. Estimated Blood Loss:     Estimated blood loss: none. Impression:               - Very minimal colonic diverticulosis.                           - Non-bleeding internal hemorrhoids.                           - The examined portion of the ileum was normal.                           - The examination was otherwise normal on direct                            and retroflexion views.                           - No specimens collected. Recommendation:           - Patient has a contact number available for                            emergencies. The signs and symptoms of potential                            delayed complications were discussed with the                            patient. Return to normal activities tomorrow.                            Written discharge  instructions were provided to the                            patient.                           - Resume previous diet.                           - Continue present medications.                           - Repeat colonoscopy is not recommended for                            screening purposes. Hence repeat colonoscopy only                            if with any new problems.                           - The findings and recommendations were discussed  with the patient's family. Lynann Bologna, MD 10/17/2023 3:50:19 PM This report has been signed electronically.

## 2023-10-17 NOTE — Progress Notes (Signed)
Gastroenterology History and Physical   Primary Care Physician:  Noberto Retort, MD   Reason for Procedure:   H/O polyps  Plan:    colon     HPI: Sandra Ellis is a 78 y.o. female    Past Medical History:  Diagnosis Date   COVID-19 virus detected 09/07/2018   Elevated cholesterol    GERD (gastroesophageal reflux disease)    H/O blood clots 1990   Hx of colonic polyp    Hypothyroidism    Pulmonary emboli [facility])     Past Surgical History:  Procedure Laterality Date   COLONOSCOPY  11/09/2014   Colonic polyp status polypectomy. Small internal hemorrhoids. Tubular adenoma.   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   Procedure: LEFT TOTAL HIP ARTHROPLASTY ANTERIOR APPROACH;  Surgeon: Durene Romans, MD;  Location: [facility];  Service: Orthopedics;  Laterality: Left;   VAGINAL HYSTERECTOMY     TVH    Prior to Admission medications   Medication Sig Start Date End Date Taking? Authorizing Provider  BERBERINE CHLORIDE PO Take by mouth.   Yes [provider]  BIOTIN PO Take 1 tablet by mouth daily in the afternoon.   Yes [provider]  Calcium-Phosphorus-Vitamin D (CALCIUM GUMMIES PO) Take by mouth.   Yes [provider]  Cholecalciferol (VITAMIN D3 PO) Take 5,000 Units by mouth daily. Every other day   Yes [provider]  CINNAMON PO Take 2 tablets by mouth daily in the afternoon.   Yes [provider]  HAWTHORN BERRY PO Take 2 tablets by mouth daily in the afternoon.   Yes [provider]  levothyroxine (SYNTHROID, LEVOTHROID) 75 MCG tablet Take 75 mcg by mouth daily before breakfast.    Yes [provider]  Magnesium 250 MG TABS Take 250 mg by mouth daily.    Yes [provider]  Multiple Vitamins-Minerals (ZINC PO) Take 1 tablet by mouth daily.   Yes [provider]  NON FORMULARY Take 1 tablet by mouth daily in the  afternoon. Beet   Yes [provider]  Omega-3 Fatty Acids (FISH OIL) 1200 MG CAPS Take 2 capsules by mouth daily.    Yes [provider]  Potassium (POTASSIMIN PO) Take 1 tablet by mouth daily.   Yes [provider]  Probiotic Product (PROBIOTIC PO) Take 1 tablet by mouth daily as needed.    Yes [provider]  rosuvastatin (CRESTOR) 5 MG tablet Take by mouth. 08/11/23 08/10/24 Yes [provider]  tretinoin (RETIN-A) 0.1 % cream Apply 1 application topically at bedtime. APPLIED TO FACE 10/10/16  Yes [provider]  TURMERIC-GINGER PO Take by mouth.   Yes [provider]  Vitamin K, Phytonadione, 100 MCG TABS Take by mouth.   Yes [provider]  Ascorbic Acid (VITAMIN C PO) Take 1 tablet by mouth daily. Patient not taking: Reported on 10/17/2023    [provider]    Current Outpatient Medications  Medication Sig Dispense Refill   BERBERINE CHLORIDE PO Take by mouth.     BIOTIN PO Take 1 tablet by mouth daily in the afternoon.     Calcium-Phosphorus-Vitamin D (CALCIUM GUMMIES PO) Take by mouth.     Cholecalciferol (VITAMIN D3 PO) Take 5,000 Units by mouth daily. Every other day     CINNAMON PO Take 2 tablets by mouth daily in the afternoon.  HAWTHORN BERRY PO Take 2 tablets by mouth daily in the afternoon.     levothyroxine (SYNTHROID, LEVOTHROID) 75 MCG tablet Take 75 mcg by mouth daily before breakfast.      Magnesium 250 MG TABS Take 250 mg by mouth daily.      Multiple Vitamins-Minerals (ZINC PO) Take 1 tablet by mouth daily.     NON FORMULARY Take 1 tablet by mouth daily in the afternoon. Beet     Omega-3 Fatty Acids (FISH OIL) 1200 MG CAPS Take 2 capsules by mouth daily.      Potassium (POTASSIMIN PO) Take 1 tablet by mouth daily.     Probiotic Product (PROBIOTIC PO) Take 1 tablet by mouth daily as needed.      rosuvastatin (CRESTOR) 5 MG tablet Take by mouth.     tretinoin (RETIN-A) 0.1 % cream Apply  1 application topically at bedtime. APPLIED TO FACE     TURMERIC-GINGER PO Take by mouth.     Vitamin K, Phytonadione, 100 MCG TABS Take by mouth.     Ascorbic Acid (VITAMIN C PO) Take 1 tablet by mouth daily. (Patient not taking: Reported on 10/17/2023)     Current Facility-Administered Medications  Medication Dose Route Frequency Provider Last Rate Last Admin   0.9 %  sodium chloride infusion  500 mL Intravenous Continuous Lynann Bologna, MD        Allergies as of 10/17/2023 - Review Complete 10/17/2023  Allergen Reaction Noted   Nitrofuran derivatives Shortness Of Breath and Rash 02/10/2020   Levaquin [levofloxacin in d5w] Other (See Comments) 09/15/2014   Atorvastatin Other (See Comments) 09/21/2020   Ciprofloxacin Rash and Other (See Comments) 12/10/2016   Pravastatin sodium Other (See Comments) 09/21/2020   Tramadol hcl Other (See Comments) 09/21/2020    Family History  Problem Relation Age of Onset   Diabetes Mother    Asthma Mother    Colon cancer Neg Hx    Esophageal cancer Neg Hx    Pancreatic cancer Neg Hx    Stomach cancer Neg Hx    Colon polyps Neg Hx    Rectal cancer Neg Hx     Social History   Socioeconomic History   Marital status: Married    Spouse name: Not on file   Number of children: 3   Years of education: Not on file   Highest education level: Not on file  Occupational History   Occupation: minister  Tobacco Use   Smoking status: Former    Current packs/day: 0.00    Average packs/day: 0.5 packs/day for 8.0 years (4.0 ttl pk-yrs)    Types: Cigarettes    Start date: 09/02/1965    Quit date: 09/02/1973    Years since quitting: 50.1   Smokeless tobacco: Never  Vaping Use   Vaping status: Never Used  Substance and Sexual Activity   Alcohol use: No    Alcohol/week: 0.0 standard drinks of alcohol   Drug use: No   Sexual activity: Yes    Birth control/protection: Surgical    Comment: FIRST INTERCORSE AGE 57- ONE SEXUAL PARTNER   Other Topics  Concern   Not on file  Social History Narrative   Not on file   Social Drivers of Health   Financial Resource Strain: Not on file  Food Insecurity: Not on file  Transportation Needs: Not on file  Physical Activity: Not on file  Stress: Not on file  Social Connections: Not on file  Intimate Partner Violence: Not on file  Review of Systems: Positive for none All other review of systems negative except as mentioned in the HPI.  Physical Exam: Vital signs in last 24 hours: @VSRANGES @   General:   Alert,  Well-developed, well-nourished, pleasant and cooperative in NAD Lungs:  Clear throughout to auscultation.   Heart:  Regular rate and rhythm; no murmurs, clicks, rubs,  or gallops. Abdomen:  Soft, nontender and nondistended. Normal bowel sounds.   Neuro/Psych:  Alert and cooperative. Normal mood and affect. A and O x 3    No significant changes were identified.  The patient continues to be an appropriate candidate for the planned procedure and anesthesia.   Edman Circle, MD. [facility] 10/17/2023 3:18 PM@

## 2023-10-17 NOTE — Patient Instructions (Signed)
Educational handout provided to patient related to Hemorrhoids and Diverticulosis  Resume previous diet  Continue present medications  REPEAT COLONOSCOPY IS NOT RECOMMENDED FOR SCREENING PURPOSES  YOU HAD AN ENDOSCOPIC PROCEDURE TODAY AT THE [facility]:   Refer to the procedure report that was given to you for any specific questions about what was found during the examination.  If the procedure report does not answer your questions, please call your gastroenterologist to clarify.  If you requested that your care partner not be given the details of your procedure findings, then the procedure report has been included in a sealed envelope for you to review at your convenience later.  YOU SHOULD EXPECT: Some feelings of bloating in the abdomen. Passage of more gas than usual.  Walking can help get rid of the air that was put into your GI tract during the procedure and reduce the bloating. If you had a lower endoscopy (such as a colonoscopy or flexible sigmoidoscopy) you may notice spotting of blood in your stool or on the toilet paper. If you underwent a bowel prep for your procedure, you may not have a normal bowel movement for a few days.  Please Note:  You might notice some irritation and congestion in your nose or some drainage.  This is from the oxygen used during your procedure.  There is no need for concern and it should clear up in a day or so.  SYMPTOMS TO REPORT IMMEDIATELY:  Following lower endoscopy (colonoscopy or flexible sigmoidoscopy):  Excessive amounts of blood in the stool  Significant tenderness or worsening of abdominal pains  Swelling of the abdomen that is new, acute  Fever of 100F or higher  For urgent or emergent issues, a gastroenterologist can be reached at any hour by calling [phone]. Do not use MyChart messaging for urgent concerns.    DIET:  We do recommend a small meal at first, but then you may proceed to your regular diet.  Drink plenty of  fluids but you should avoid alcoholic beverages for 24 hours.  ACTIVITY:  You should plan to take it easy for the rest of today and you should NOT DRIVE or use heavy machinery until tomorrow (because of the sedation medicines used during the test).    FOLLOW UP: Our staff will call the number listed on your records the next business day following your procedure.  We will call around 7:15- 8:00 am to check on you and address any questions or concerns that you may have regarding the information given to you following your procedure. If we do not reach you, we will leave a message.     If any biopsies were taken you will be contacted by phone or by letter within the next 1-3 weeks.  Please call us at [phone] if you have not heard about the biopsies in 3 weeks.    SIGNATURES/CONFIDENTIALITY: You and/or your care partner have signed paperwork which will be entered into your electronic medical record.  These signatures attest to the fact that that the information above on your After Visit Summary has been reviewed and is understood.  Full responsibility of the confidentiality of this discharge information lies with you and/or your care-partner.

## 2023-10-17 NOTE — Progress Notes (Signed)
Report to PACU, RN, vss, BBS= Clear.

## 2023-10-20 ENCOUNTER — Telehealth: Payer: Self-pay | Admitting: *Deleted

## 2023-10-20 NOTE — Telephone Encounter (Signed)
 No answer on  follow up call. Left message.
